# Patient Record
Sex: Female | Born: 1995 | Race: Black or African American | Hispanic: No | Marital: Single | Smoking: Former smoker
Health system: Southern US, Community
[De-identification: ages and names within clinical notes are randomized; demographics above are authoritative.]

## PROBLEM LIST (undated history)

## (undated) ENCOUNTER — Inpatient Hospital Stay (HOSPITAL_COMMUNITY): Payer: Self-pay

## (undated) DIAGNOSIS — E669 Obesity, unspecified: Secondary | ICD-10-CM

## (undated) DIAGNOSIS — L309 Dermatitis, unspecified: Secondary | ICD-10-CM

## (undated) DIAGNOSIS — J40 Bronchitis, not specified as acute or chronic: Secondary | ICD-10-CM

## (undated) DIAGNOSIS — I10 Essential (primary) hypertension: Secondary | ICD-10-CM

## (undated) DIAGNOSIS — J45909 Unspecified asthma, uncomplicated: Secondary | ICD-10-CM

## (undated) DIAGNOSIS — O24419 Gestational diabetes mellitus in pregnancy, unspecified control: Secondary | ICD-10-CM

## (undated) DIAGNOSIS — J302 Other seasonal allergic rhinitis: Secondary | ICD-10-CM

## (undated) HISTORY — DX: Gestational diabetes mellitus in pregnancy, unspecified control: O24.419

## (undated) HISTORY — PX: WISDOM TOOTH EXTRACTION: SHX21

## (undated) HISTORY — DX: Bronchitis, not specified as acute or chronic: J40

## (undated) HISTORY — DX: Dermatitis, unspecified: L30.9

## (undated) HISTORY — DX: Other seasonal allergic rhinitis: J30.2

---
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
## 2017-01-13 ENCOUNTER — Emergency Department (HOSPITAL_COMMUNITY)
Admission: EM | Admit: 2017-01-13 | Discharge: 2017-01-14 | Disposition: A | Discharge status: Home / Self Care | Payer: Medicaid Other | Attending: Emergency Medicine | Admitting: Emergency Medicine

## 2017-01-13 ENCOUNTER — Encounter: Payer: Self-pay | Admitting: Emergency Medicine

## 2017-01-13 DIAGNOSIS — S299XXA Unspecified injury of thorax, initial encounter: Secondary | ICD-10-CM | POA: Diagnosis present

## 2017-01-13 DIAGNOSIS — S29012A Strain of muscle and tendon of back wall of thorax, initial encounter: Secondary | ICD-10-CM | POA: Diagnosis not present

## 2017-01-13 DIAGNOSIS — Y939 Activity, unspecified: Secondary | ICD-10-CM | POA: Diagnosis not present

## 2017-01-13 DIAGNOSIS — S46811A Strain of other muscles, fascia and tendons at shoulder and upper arm level, right arm, initial encounter: Secondary | ICD-10-CM

## 2017-01-13 DIAGNOSIS — S39012A Strain of muscle, fascia and tendon of lower back, initial encounter: Secondary | ICD-10-CM | POA: Diagnosis not present

## 2017-01-13 DIAGNOSIS — Y92410 Unspecified street and highway as the place of occurrence of the external cause: Secondary | ICD-10-CM | POA: Insufficient documentation

## 2017-01-13 DIAGNOSIS — Y999 Unspecified external cause status: Secondary | ICD-10-CM | POA: Diagnosis not present

## 2017-01-13 MED ORDER — METHOCARBAMOL 500 MG PO TABS: 1000.0000 mg | ORAL_TABLET | Freq: Three times a day (TID) | ORAL | 0 refills | Status: DC | PRN

## 2017-01-13 MED ORDER — IBUPROFEN 200 MG PO TABS
400.0000 mg | ORAL_TABLET | Freq: Once | ORAL | Status: AC
  Administered 2017-01-14: 400 mg via ORAL
  Filled 2017-01-13: qty 2

## 2017-01-13 MED ORDER — ACETAMINOPHEN 500 MG PO TABS
1000.0000 mg | ORAL_TABLET | Freq: Once | ORAL | Status: AC
  Administered 2017-01-14: 1000 mg via ORAL
  Filled 2017-01-13: qty 2

## 2017-01-13 MED ORDER — METHOCARBAMOL 750 MG PO TABS: 750.0000 mg | ORAL_TABLET | Freq: Three times a day (TID) | ORAL | 0 refills | Status: DC | PRN

## 2017-01-13 NOTE — Discharge Instructions (Signed)
It was our pleasure to provide your ER care today - we hope that you feel better.  Rest. Take motrin or aleve as need for pain.  Take robaxin as need for muscle pain/spasm - no driving when taking.  Follow up with primary care doctor in 1 week if symptoms fail to improve/resolve.  Return to ER if worse, new symptoms, trouble breathing, new or severe pain, other concern.
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
## 2018-03-14 ENCOUNTER — Encounter (HOSPITAL_COMMUNITY): Payer: Self-pay | Admitting: *Deleted

## 2018-03-14 ENCOUNTER — Emergency Department (HOSPITAL_COMMUNITY)
Admission: EM | Admit: 2018-03-14 | Discharge: 2018-03-14 | Disposition: A | Discharge status: Home / Self Care | Payer: Medicaid Other | Attending: Emergency Medicine | Admitting: Emergency Medicine

## 2018-03-14 ENCOUNTER — Other Ambulatory Visit: Payer: Self-pay

## 2018-03-14 DIAGNOSIS — N390 Urinary tract infection, site not specified: Secondary | ICD-10-CM

## 2018-03-14 DIAGNOSIS — R350 Frequency of micturition: Secondary | ICD-10-CM | POA: Insufficient documentation

## 2018-03-14 DIAGNOSIS — R110 Nausea: Secondary | ICD-10-CM | POA: Insufficient documentation

## 2018-03-14 DIAGNOSIS — R319 Hematuria, unspecified: Secondary | ICD-10-CM | POA: Diagnosis not present

## 2018-03-14 DIAGNOSIS — R1030 Lower abdominal pain, unspecified: Secondary | ICD-10-CM | POA: Insufficient documentation

## 2018-03-14 DIAGNOSIS — N939 Abnormal uterine and vaginal bleeding, unspecified: Secondary | ICD-10-CM | POA: Insufficient documentation

## 2018-03-14 DIAGNOSIS — R1033 Periumbilical pain: Secondary | ICD-10-CM | POA: Diagnosis present

## 2018-03-14 LAB — COMPREHENSIVE METABOLIC PANEL
ALK PHOS: 32 U/L — AB (ref 38–126)
ALT: 23 U/L (ref 0–44)
AST: 22 U/L (ref 15–41)
Albumin: 3.7 g/dL (ref 3.5–5.0)
Anion gap: 8 (ref 5–15)
BUN: 10 mg/dL (ref 6–20)
CALCIUM: 9.5 mg/dL (ref 8.9–10.3)
CO2: 23 mmol/L (ref 22–32)
CREATININE: 0.74 mg/dL (ref 0.44–1.00)
Chloride: 109 mmol/L (ref 98–111)
Glucose, Bld: 85 mg/dL (ref 70–99)
Potassium: 4 mmol/L (ref 3.5–5.1)
SODIUM: 140 mmol/L (ref 135–145)
TOTAL PROTEIN: 7.6 g/dL (ref 6.5–8.1)
Total Bilirubin: 0.6 mg/dL (ref 0.3–1.2)

## 2018-03-14 LAB — WET PREP, GENITAL
SPERM: NONE SEEN
TRICH WET PREP: NONE SEEN
YEAST WET PREP: NONE SEEN

## 2018-03-14 LAB — URINALYSIS, ROUTINE W REFLEX MICROSCOPIC
Bilirubin Urine: NEGATIVE
GLUCOSE, UA: NEGATIVE mg/dL
KETONES UR: NEGATIVE mg/dL
NITRITE: NEGATIVE
PH: 6 (ref 5.0–8.0)
Protein, ur: 30 mg/dL — AB
Specific Gravity, Urine: 1.029 (ref 1.005–1.030)

## 2018-03-14 LAB — CBC
HCT: 40.1 % (ref 36.0–46.0)
HEMOGLOBIN: 13.7 g/dL (ref 12.0–15.0)
MCH: 30.3 pg (ref 26.0–34.0)
MCHC: 34.2 g/dL (ref 30.0–36.0)
MCV: 88.7 fL (ref 78.0–100.0)
PLATELETS: 328 10*3/uL (ref 150–400)
RBC: 4.52 MIL/uL (ref 3.87–5.11)
RDW: 12.4 % (ref 11.5–15.5)
WBC: 11.5 10*3/uL — AB (ref 4.0–10.5)

## 2018-03-14 LAB — LIPASE, BLOOD: Lipase: 21 U/L (ref 11–51)

## 2018-03-14 LAB — RAPID HIV SCREEN (HIV 1/2 AB+AG)
HIV 1/2 Antibodies: NONREACTIVE
HIV-1 P24 ANTIGEN - HIV24: NONREACTIVE

## 2018-03-14 LAB — I-STAT BETA HCG BLOOD, ED (MC, WL, AP ONLY): I-stat hCG, quantitative: <5 m[IU]/mL (ref <5)

## 2018-03-14 MED ORDER — CEPHALEXIN 500 MG PO CAPS: 500.0000 mg | ORAL_CAPSULE | Freq: Two times a day (BID) | ORAL | 0 refills | Status: AC

## 2018-03-14 MED ORDER — KETOROLAC TROMETHAMINE 15 MG/ML IJ SOLN
15.0000 mg | Freq: Once | INTRAMUSCULAR | Status: AC
  Administered 2018-03-14: 15 mg via INTRAVENOUS
  Filled 2018-03-14: qty 1

## 2018-03-14 MED ORDER — ONDANSETRON 4 MG PO TBDP
4.0000 mg | ORAL_TABLET | Freq: Once | ORAL | Status: AC
Start: 2018-03-14 — End: 2018-03-14
  Administered 2018-03-14: 4 mg via ORAL
  Filled 2018-03-14: qty 1
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
## 2018-03-14 NOTE — Discharge Instructions (Addendum)
You were given a prescription for antibiotics. Please take the antibiotic prescription fully.   If you continue to have blood in your urine you should follow-up with the urologist.  Their information was provided for you on your discharge paperwork.  Please follow-up with your regular doctor or your gynecologist in 1 week for reevaluation.  Please return to the ER sooner if you have any new or worsening symptoms, or if you have any of the following symptoms:  Abdominal pain that does not go away.  You have a fever.  You keep throwing up (vomiting).  The pain is felt only in portions of the abdomen. Pain in the right side could possibly be appendicitis. In an adult, pain in the left lower portion of the abdomen could be colitis or diverticulitis.  You pass bloody or black tarry stools.  There is bright red blood in the stool.  The constipation stays for more than 4 days.  There is belly (abdominal) or rectal pain.  You do not seem to be getting better.  You have any questions or concerns.

## 2018-03-14 NOTE — ED Provider Notes (Signed)
11:30 PM Patient care assumed from Peter Kiewit SonsCortni Couture, PA-C at shift change. Pending wet prep with plans to tx for UTI given pyuria and reported frequency in the setting of intermittent suprapubic abdominal pain. Of note, pain began at onset of menses. Wet prep shows clue cells, but patient denies abnormal discharge with her menses or prior to. Do not believe patient warrants acute treatment with Flagyl. No trichomonas or yeast present.  Discussed plan for discharge on Keflex.  Patient verbalizes understanding.  Have encouraged the use of Tylenol, ibuprofen, or Aleve for pain.  Return precautions discussed and provided. Patient discharged in stable condition with no unaddressed concerns.  Vitals:   03/14/18 2145 03/14/18 2215 03/14/18 2245 03/14/18 2330  BP: 129/82 137/63 107/81   Pulse: 78 81 75 73  Resp:      Temp:      SpO2: 100% 100% 99% 99%  Weight:      Height:          Antony MaduraHumes, Marks Scalera, PA-C 03/14/18 2332    Mancel BaleWentz, Elliott, MD 03/14/18 [phone]

## 2018-03-14 NOTE — ED Triage Notes (Signed)
lmp last month 20th another period last pm  She has abd pain for one week   Last normaol period was July 20th thru the 25th  She hjad a pos preg test

## 2018-03-15 LAB — GC/CHLAMYDIA PROBE AMP (~~LOC~~) NOT AT ARMC
CHLAMYDIA, DNA PROBE: POSITIVE — AB
Neisseria Gonorrhea: NEGATIVE

## 2018-03-16 LAB — RPR: RPR: NONREACTIVE

## 2018-03-19 ENCOUNTER — Telehealth: Payer: Self-pay | Admitting: Medical

## 2018-03-19 DIAGNOSIS — A749 Chlamydial infection, unspecified: Secondary | ICD-10-CM

## 2018-03-19 MED ORDER — AZITHROMYCIN 250 MG PO TABS: 1000.0000 mg | ORAL_TABLET | Freq: Once | ORAL | 0 refills | Status: AC

## 2018-03-19 NOTE — Telephone Encounter (Addendum)
Autumn Duarte tested positive for  Chlamydia. Patient was called by RN and allergies and pharmacy confirmed. Rx sent to pharmacy of choice.   Marny LowensteinWenzel, Cailin Gebel N, PA-C 03/19/2018 10:25 AM      ----- Message from Kathe BectonLori S Berdik, RN sent at 03/16/2018  1:27 PM EDT ----- This patient tested positive for:  Chlamydia  She "has NKDA",  I have informed the patient of her results and confirmed her pharmacy is correct in her chart. Please send Rx.   Thank you,   Kathe BectonBerdik, Lori S, RN   [facility]

## 2018-04-04 ENCOUNTER — Ambulatory Visit (HOSPITAL_COMMUNITY): Payer: Medicaid Other | Admitting: Psychiatry

## 2018-04-10 ENCOUNTER — Ambulatory Visit (HOSPITAL_COMMUNITY): Payer: Self-pay | Admitting: Psychiatry

## 2018-04-19 ENCOUNTER — Ambulatory Visit (HOSPITAL_COMMUNITY): Payer: Self-pay | Admitting: Psychiatry

## 2018-04-26 ENCOUNTER — Ambulatory Visit (HOSPITAL_COMMUNITY): Payer: Self-pay | Admitting: Licensed Clinical Social Worker

## 2018-05-10 ENCOUNTER — Encounter (HOSPITAL_BASED_OUTPATIENT_CLINIC_OR_DEPARTMENT_OTHER): Payer: Self-pay | Admitting: *Deleted

## 2018-05-10 ENCOUNTER — Emergency Department (HOSPITAL_BASED_OUTPATIENT_CLINIC_OR_DEPARTMENT_OTHER)
Admission: EM | Admit: 2018-05-10 | Discharge: 2018-05-10 | Disposition: A | Discharge status: Home / Self Care | Payer: Medicaid Other | Attending: Emergency Medicine | Admitting: Emergency Medicine

## 2018-05-10 ENCOUNTER — Other Ambulatory Visit: Payer: Self-pay

## 2018-05-10 DIAGNOSIS — O219 Vomiting of pregnancy, unspecified: Secondary | ICD-10-CM

## 2018-05-10 DIAGNOSIS — Z3A01 Less than 8 weeks gestation of pregnancy: Secondary | ICD-10-CM | POA: Insufficient documentation

## 2018-05-10 DIAGNOSIS — Z79899 Other long term (current) drug therapy: Secondary | ICD-10-CM | POA: Diagnosis not present

## 2018-05-10 LAB — URINALYSIS, ROUTINE W REFLEX MICROSCOPIC
Bilirubin Urine: NEGATIVE
GLUCOSE, UA: NEGATIVE mg/dL
Ketones, ur: 15 mg/dL — AB
Nitrite: NEGATIVE
PROTEIN: NEGATIVE mg/dL
SPECIFIC GRAVITY, URINE: 1.02 (ref 1.005–1.030)
pH: 7.5 (ref 5.0–8.0)

## 2018-05-10 LAB — URINALYSIS, MICROSCOPIC (REFLEX)

## 2018-05-10 MED ORDER — METOCLOPRAMIDE HCL 10 MG PO TABS: 10.0000 mg | ORAL_TABLET | Freq: Four times a day (QID) | ORAL | 0 refills | Status: DC | PRN

## 2018-05-10 MED ORDER — SODIUM CHLORIDE 0.9 % IV BOLUS
1000.0000 mL | Freq: Once | INTRAVENOUS | Status: AC
  Administered 2018-05-10: 1000 mL via INTRAVENOUS

## 2018-05-10 MED ORDER — METOCLOPRAMIDE HCL 5 MG/ML IJ SOLN
10.0000 mg | Freq: Once | INTRAMUSCULAR | Status: AC
  Administered 2018-05-10: 10 mg via INTRAVENOUS
  Filled 2018-05-10: qty 2

## 2018-05-10 NOTE — ED Provider Notes (Signed)
[facility] EMERGENCY DEPARTMENT Provider Note   CSN: [id number] Arrival date & time: 05/10/18  1813     History   Chief Complaint Chief Complaint  Patient presents with  . Nausea    HPI Autumn Duarte is a 22 y.o. female.  Patient who is approximately [redacted] weeks pregnant presents to the emergency department today with 1 week history of nausea, vomiting.  Patient states that she has been having approximately 2-3 episodes of nausea vomiting per day however today symptoms became worse and she is having approximately 6-7 episodes of vomiting.  She denies any chest pain or shortness of breath.  No abdominal pain, vaginal bleeding, pelvic pain, discharge. The onset of this condition was acute. The course is constant. Aggravating factors: none. Alleviating factors: none.  This is the patient's first pregnancy.     Past Medical History:  Diagnosis Date  . Arthritis     Patient Active Problem List   Diagnosis Date Noted  . HAIR LOSS 11/18/2008  . PROBLEMS WITH HEARING 11/18/2008  . DYSPEPSIA&OTHER [facility] DISORDERS FUNCTION STOMACH 10/10/2007  . PES PLANUS 10/10/2007  . GROWING PAINS 10/10/2007  . SINUSITIS, ACUTE 08/24/2007  . ECZEMA 04/16/2007    History reviewed. No pertinent surgical history.   OB History   None      Home Medications    Prior to Admission medications   Medication Sig Start Date End Date Taking? Authorizing Provider  Prenatal Vit-Fe Fumarate-FA (PRENATAL MULTIVITAMIN) TABS tablet Take 1 tablet by mouth daily at 12 noon.   Yes [provider]    Family History History reviewed. No pertinent family history.  Social History Social History   Tobacco Use  . Smoking status: Never Smoker  . Smokeless tobacco: Never Used  Substance Use Topics  . Alcohol use: Not Currently  . Drug use: Not Currently     Allergies   Patient has no known allergies.   Review of Systems Review of Systems  Constitutional: Negative for fever.    HENT: Negative for rhinorrhea and sore throat.   Eyes: Negative for redness.  Respiratory: Negative for cough.   Cardiovascular: Negative for chest pain.  Gastrointestinal: Positive for nausea and vomiting. Negative for abdominal pain and diarrhea.  Genitourinary: Negative for dysuria.  Musculoskeletal: Negative for myalgias.  Skin: Negative for rash.  Neurological: Negative for headaches.     Physical Exam Updated Vital Signs BP 115/62 (BP Location: Left Arm)   Temp 98.2 F (36.8 C)   Ht 5\' 3"  (1.6 m)   Wt 127 kg   LMP 03/19/2018   SpO2 100%   BMI 49.60 kg/m   Physical Exam  Constitutional: She appears well-developed and well-nourished.  HENT:  Head: Normocephalic and atraumatic.  Eyes: Conjunctivae are normal. Right eye exhibits no discharge. Left eye exhibits no discharge.  Neck: Normal range of motion. Neck supple.  Cardiovascular: Normal rate, regular rhythm and normal heart sounds.  Pulmonary/Chest: Effort normal and breath sounds normal.  Abdominal: Soft. There is no tenderness. There is no rebound and no guarding.  Neurological: She is alert.  Skin: Skin is warm and dry.  Psychiatric: She has a normal mood and affect.  Nursing note and vitals reviewed.    ED Treatments / Results  Labs (all labs ordered are listed, but only abnormal results are displayed) Labs Reviewed  URINALYSIS, ROUTINE W REFLEX MICROSCOPIC - Abnormal; Notable for the following components:      Result Value   APPearance HAZY (*)  Hgb urine dipstick TRACE (*)    Ketones, ur 15 (*)    Leukocytes, UA SMALL (*)    All other components within normal limits  URINALYSIS, MICROSCOPIC (REFLEX) - Abnormal; Notable for the following components:   Bacteria, UA RARE (*)    All other components within normal limits    EKG None  Radiology No results found.  Procedures Procedures (including critical care time)  Medications Ordered in ED Medications  sodium chloride 0.9 % bolus 1,000 mL  (1,000 mLs Intravenous New Bag/Given 05/10/18 2012)  metoCLOPramide (REGLAN) injection 10 mg (10 mg Intravenous Given 05/10/18 2013)     Initial Impression / Assessment and Plan / ED Course  I have reviewed the triage vital signs and the nursing notes.  Pertinent labs & imaging results that were available during my care of the patient were reviewed by me and considered in my medical decision making (see chart for details).     Patient seen and examined. Work-up initiated. Medications ordered.  Will p.o. challenge after administration of nausea medication.  Vital signs reviewed and are as follows: BP 115/62 (BP Location: Left Arm)   Temp 98.2 F (36.8 C)   Ht 5\' 3"  (1.6 m)   Wt 127 kg   LMP 03/19/2018   SpO2 100%   BMI 49.60 kg/m   Patient feels better after IV Reglan and fluids.  She is dressed and sitting in the room.  She states she is ready for discharge.  Home with Reglan.  The patient was urged to return to the Emergency Department immediately with worsening of current symptoms, vaginal bleeding or lower abdominal pain, worsening abdominal pain, persistent vomiting, blood noted in stools, fever, or any other concerns. The patient verbalized understanding.    Final Clinical Impressions(s) / ED Diagnoses   Final diagnoses:  Nausea and vomiting in pregnancy   Patient with nausea and vomiting in pregnancy.  No vaginal symptoms or abdominal pain reported or on exam.  UA without definite infection.  Patient treated with IV fluids and antiemetics with great improvement.  She has appropriate OB/GYN follow-up.  Return instructions as above.  Patient seems reliable to return if symptoms worsen.  ED Discharge Orders         Ordered    metoCLOPramide (REGLAN) 10 MG tablet  Every 6 hours PRN     05/10/18 2146           Renne Crigler, PA-C 05/10/18 2149    Alvira Monday, MD 05/11/18 1245

## 2018-05-10 NOTE — ED Triage Notes (Signed)
Pt c/o n/v  , 7 week preg .

## 2018-05-10 NOTE — ED Notes (Signed)
Pt has been able to drink and eat without issue

## 2018-05-10 NOTE — ED Notes (Signed)
Patient left at this time with all belongings. 

## 2018-05-10 NOTE — ED Notes (Signed)
ED Provider at bedside. 

## 2018-05-10 NOTE — Discharge Instructions (Signed)
Please read and follow all provided instructions.  Your diagnoses today include:  1. Nausea and vomiting in pregnancy     Tests performed today include:  Urine test to look for infection   Vital signs. See below for your results today.   Medications prescribed:   Reglan-medication for nausea and vomiting  Take any prescribed medications only as directed.  Home care instructions:   Follow any educational materials contained in this packet.   Drink clear liquids for the next 24 hours and introduce solid foods slowly after 24 hours using the b.r.a.t. diet (Bananas, Rice, Applesauce, Toast, Yogurt).    Follow-up instructions: Please follow-up with your primary care provider as needed for further evaluation of your symptoms.   Return instructions:  SEEK IMMEDIATE MEDICAL ATTENTION IF:  If you have pain that does not go away or becomes severe   A temperature above 101F develops   You have vaginal bleeding or lower abdominal pain  Repeated vomiting occurs (multiple episodes)   If you have pain that becomes localized to portions of the abdomen. The right side could possibly be appendicitis. In an adult, the left lower portion of the abdomen could be colitis or diverticulitis.   Blood is being passed in stools or vomit (bright red or black tarry stools)   You develop chest pain, difficulty breathing, dizziness or fainting, or become confused, poorly responsive, or inconsolable (young children)  If you have any other emergent concerns regarding your health  Your vital signs today were: BP 122/67 (BP Location: Right Arm)    Pulse 73    Temp 98.2 F (36.8 C)    Resp 16    Ht 5\' 3"  (1.6 m)    Wt 127 kg    LMP 03/19/2018    SpO2 100%    BMI 49.60 kg/m  If your blood pressure (bp) was elevated above 135/85 this visit, please have this repeated by your doctor within one month. --------------

## 2018-06-02 ENCOUNTER — Inpatient Hospital Stay (HOSPITAL_COMMUNITY)
Admission: AD | Admit: 2018-06-02 | Discharge: 2018-06-02 | Disposition: A | Discharge status: Home / Self Care | Payer: Medicaid Other | Attending: Obstetrics & Gynecology | Admitting: Obstetrics & Gynecology

## 2018-06-02 ENCOUNTER — Inpatient Hospital Stay (HOSPITAL_COMMUNITY): Payer: Medicaid Other

## 2018-06-02 ENCOUNTER — Encounter (HOSPITAL_COMMUNITY): Payer: Self-pay | Admitting: *Deleted

## 2018-06-02 DIAGNOSIS — Z87891 Personal history of nicotine dependence: Secondary | ICD-10-CM | POA: Diagnosis not present

## 2018-06-02 DIAGNOSIS — R109 Unspecified abdominal pain: Secondary | ICD-10-CM

## 2018-06-02 DIAGNOSIS — O30001 Twin pregnancy, unspecified number of placenta and unspecified number of amniotic sacs, first trimester: Secondary | ICD-10-CM | POA: Insufficient documentation

## 2018-06-02 DIAGNOSIS — Z3A11 11 weeks gestation of pregnancy: Secondary | ICD-10-CM | POA: Diagnosis not present

## 2018-06-02 DIAGNOSIS — R1030 Lower abdominal pain, unspecified: Secondary | ICD-10-CM | POA: Diagnosis not present

## 2018-06-02 DIAGNOSIS — O26891 Other specified pregnancy related conditions, first trimester: Secondary | ICD-10-CM | POA: Diagnosis not present

## 2018-06-02 HISTORY — DX: Obesity, unspecified: E66.9

## 2018-06-02 HISTORY — DX: Unspecified asthma, uncomplicated: J45.909

## 2018-06-02 LAB — URINALYSIS, ROUTINE W REFLEX MICROSCOPIC
Bilirubin Urine: NEGATIVE
Glucose, UA: NEGATIVE mg/dL
Ketones, ur: NEGATIVE mg/dL
Nitrite: NEGATIVE
PROTEIN: NEGATIVE mg/dL
Specific Gravity, Urine: 1.018 (ref 1.005–1.030)
pH: 7 (ref 5.0–8.0)

## 2018-06-02 LAB — WET PREP, GENITAL
TRICH WET PREP: NONE SEEN
YEAST WET PREP: NONE SEEN

## 2018-06-02 LAB — CBC
HEMATOCRIT: 35.8 % — AB (ref 36.0–46.0)
Hemoglobin: 12.7 g/dL (ref 12.0–15.0)
MCH: 30.8 pg (ref 26.0–34.0)
MCHC: 35.5 g/dL (ref 30.0–36.0)
MCV: 86.9 fL (ref 80.0–100.0)
Platelets: 247 10*3/uL (ref 150–400)
RBC: 4.12 MIL/uL (ref 3.87–5.11)
RDW: 13 % (ref 11.5–15.5)
WBC: 8.5 10*3/uL (ref 4.0–10.5)

## 2018-06-02 LAB — HCG, QUANTITATIVE, PREGNANCY: HCG, BETA CHAIN, QUANT, S: 172555 m[IU]/mL — AB (ref <5)

## 2018-06-02 LAB — ABO/RH: ABO/RH(D): O POS

## 2018-06-02 LAB — HIV ANTIBODY (ROUTINE TESTING W REFLEX): HIV Screen 4th Generation wRfx: NONREACTIVE

## 2018-06-02 LAB — POCT PREGNANCY, URINE: Preg Test, Ur: POSITIVE — AB

## 2018-06-02 NOTE — MAU Note (Signed)
Have had a cramp in LLQ constantly  since this am. Denies vag bleeding or d/c.

## 2018-06-02 NOTE — Progress Notes (Signed)
RN obtained swabs by blind swab 

## 2018-06-02 NOTE — Progress Notes (Signed)
Lisa Leftwich-Kirby CNM in earlier to discuss test results and d/c plan. Written and verbal d/c instructions given and understanding voiced. 

## 2018-06-02 NOTE — MAU Provider Note (Signed)
Chief Complaint: Abdominal Pain   None     SUBJECTIVE HPI: Autumn Duarte is a 22 y.o. G1P0 who presents to maternity admissions reporting intermittent sharp lower abdominal pain x 24 hours.  The pain is the same in intensity since onset. There are no other symptoms.  She has not tried any treatments. She denies vaginal bleeding, vaginal itching/burning, urinary symptoms, h/a, dizziness, n/v, or fever/chills.     HPI  Past Medical History:  Diagnosis Date  . Arthritis   . Asthma   . Obesity    Past Surgical History:  Procedure Laterality Date  . NO PAST SURGERIES     Social History   Socioeconomic History  . Marital status: Single    Spouse name: Not on file  . Number of children: Not on file  . Years of education: Not on file  . Highest education level: Not on file  Occupational History  . Not on file  Social Needs  . Financial resource strain: Not on file  . Food insecurity:    Worry: Not on file    Inability: Not on file  . Transportation needs:    Medical: Not on file    Non-medical: Not on file  Tobacco Use  . Smoking status: Former Games developer  . Smokeless tobacco: Never Used  Substance and Sexual Activity  . Alcohol use: Not Currently  . Drug use: Not Currently  . Sexual activity: Yes    Birth control/protection: None  Lifestyle  . Physical activity:    Days per week: Not on file    Minutes per session: Not on file  . Stress: Not on file  Relationships  . Social connections:    Talks on phone: Not on file    Gets together: Not on file    Attends religious service: Not on file    Active member of club or organization: Not on file    Attends meetings of clubs or organizations: Not on file    Relationship status: Not on file  . Intimate partner violence:    Fear of current or ex partner: Not on file    Emotionally abused: Not on file    Physically abused: Not on file    Forced sexual activity: Not on file  Other Topics Concern  . Not on file  Social  History Narrative  . Not on file   No current facility-administered medications on file prior to encounter.    Current Outpatient Medications on File Prior to Encounter  Medication Sig Dispense Refill  . doxylamine, Sleep, (UNISOM) 25 MG tablet Take 25 mg by mouth at bedtime.    . Prenatal Vit-Fe Fumarate-FA (PRENATAL MULTIVITAMIN) TABS tablet Take 1 tablet by mouth daily at 12 noon.    . metoCLOPramide (REGLAN) 10 MG tablet Take 1 tablet (10 mg total) by mouth every 6 (six) hours as needed for nausea or vomiting. 15 tablet 0   No Known Allergies  ROS:  Review of Systems  Constitutional: Negative for chills, fatigue and fever.  Respiratory: Negative for shortness of breath.   Cardiovascular: Negative for chest pain.  Gastrointestinal: Positive for abdominal pain. Negative for nausea and vomiting.  Genitourinary: Positive for pelvic pain. Negative for difficulty urinating, dysuria, flank pain, vaginal bleeding, vaginal discharge and vaginal pain.  Neurological: Negative for dizziness and headaches.  Psychiatric/Behavioral: Negative.      I have reviewed patient's Past Medical Hx, Surgical Hx, Family Hx, Social Hx, medications and allergies.   Physical Exam  Patient Vitals for the past 24 hrs:  BP Temp Temp src Pulse Resp Height Weight  06/02/18 0253 136/83 98.9 F (37.2 C) - 98 18 - -  06/02/18 0238 (!) 152/96 98.9 F (37.2 C) Oral 100 18 - -  06/02/18 0031 - 98.5 F (36.9 C) - 87 20 5\' 3"  (1.6 m) 131.1 kg   Constitutional: Well-developed, well-nourished female in no acute distress.  Cardiovascular: normal rate Respiratory: normal effort GI: Abd soft, non-tender. Pos BS x 4 MS: Extremities nontender, no edema, normal ROM Neurologic: Alert and oriented x 4.  GU: Neg CVAT.  PELVIC EXAM: Wet prep/GCC    LAB RESULTS Results for orders placed or performed during the hospital encounter of 06/02/18 (from the past 24 hour(s))  Urinalysis, Routine w reflex microscopic      Status: Abnormal   Collection Time: 06/02/18 12:42 AM  Result Value Ref Range   Color, Urine YELLOW YELLOW   APPearance HAZY (A) CLEAR   Specific Gravity, Urine 1.018 1.005 - 1.030   pH 7.0 5.0 - 8.0   Glucose, UA NEGATIVE NEGATIVE mg/dL   Hgb urine dipstick SMALL (A) NEGATIVE   Bilirubin Urine NEGATIVE NEGATIVE   Ketones, ur NEGATIVE NEGATIVE mg/dL   Protein, ur NEGATIVE NEGATIVE mg/dL   Nitrite NEGATIVE NEGATIVE   Leukocytes, UA LARGE (A) NEGATIVE   RBC / HPF 0-5 0 - 5 RBC/hpf   WBC, UA 11-20 0 - 5 WBC/hpf   Bacteria, UA RARE (A) NONE SEEN   Squamous Epithelial / LPF 6-10 0 - 5   Sperm, UA PRESENT   Pregnancy, urine POC     Status: Abnormal   Collection Time: 06/02/18 12:53 AM  Result Value Ref Range   Preg Test, Ur POSITIVE (A) NEGATIVE  CBC     Status: Abnormal   Collection Time: 06/02/18  1:13 AM  Result Value Ref Range   WBC 8.5 4.0 - 10.5 K/uL   RBC 4.12 3.87 - 5.11 MIL/uL   Hemoglobin 12.7 12.0 - 15.0 g/dL   HCT 16.1 (L) 09.6 - 04.5 %   MCV 86.9 80.0 - 100.0 fL   MCH 30.8 26.0 - 34.0 pg   MCHC 35.5 30.0 - 36.0 g/dL   RDW 40.9 81.1 - 91.4 %   Platelets 247 150 - 400 K/uL  hCG, quantitative, pregnancy     Status: Abnormal   Collection Time: 06/02/18  1:13 AM  Result Value Ref Range   hCG, Beta Chain, Quant, S 172,555 (H) <5 mIU/mL  ABO/Rh     Status: None (Preliminary result)   Collection Time: 06/02/18  1:13 AM  Result Value Ref Range   ABO/RH(D)      O POS Performed at [facility], [address] [place], [place] [address]   Wet prep, genital     Status: Abnormal   Collection Time: 06/02/18  1:48 AM  Result Value Ref Range   Yeast Wet Prep HPF POC NONE SEEN NONE SEEN   Trich, Wet Prep NONE SEEN NONE SEEN   Clue Cells Wet Prep HPF POC PRESENT (A) NONE SEEN   WBC, Wet Prep HPF POC FEW (A) NONE SEEN   Sperm PRESENT     --/--/O POS Performed at [facility], [address], [place], [place] [address]  [phone])  IMAGING US Ob Comp Less 14  Wks  Result Date: 06/02/2018 CLINICAL DATA:  LEFT lower quadrant pain. Positive pregnancy test. Gestational age by last menstrual period 10 weeks and 5 days. Beta  HCG 172,555. EXAM: TWIN OBSTETRICAL ULTRASOUND <14 WKS COMPARISON:  None. FINDINGS: Number of IUPs:  2 Chorionicity/Amnionicity:  Dichorionic/diamniotic TWIN 1 Yolk sac:  Not present Embryo:  Present Cardiac Activity: Present Heart Rate: 161 bpm CRL: 43 mm   11 w 1 d                  [place] EDC: Dec 21, 2018 With TWIN 2 Yolk sac:  Not present Embryo:  Present Cardiac Activity: Present Heart Rate: 180 bpm CRL:   44 mm   11 w 1 d                  [place] EDC: Dec 21, 2018 Subchorionic hemorrhage:  None visualized. Maternal uterus/adnexae: Trace free fluid. 3.3 cm RIGHT anechoic simple cyst. 2 cm LEFT corpus luteal cyst. IMPRESSION: Twin live intrauterine pregnancies; gestational age by ultrasound 11 weeks and 1 day without an immediate complication. Electronically Signed   By: Awilda Metro M.D.   On: 06/02/2018 02:43   US Ob Comp Addl Gest Less 14 Wks  Result Date: 06/02/2018 CLINICAL DATA:  LEFT lower quadrant pain. Positive pregnancy test. Gestational age by last menstrual period 10 weeks and 5 days. Beta HCG 172,555. EXAM: TWIN OBSTETRICAL ULTRASOUND <14 WKS COMPARISON:  None. FINDINGS: Number of IUPs:  2 Chorionicity/Amnionicity:  Dichorionic/diamniotic TWIN 1 Yolk sac:  Not present Embryo:  Present Cardiac Activity: Present Heart Rate: 161 bpm CRL: 43 mm   11 w 1 d                  [place] EDC: Dec 21, 2018 With TWIN 2 Yolk sac:  Not present Embryo:  Present Cardiac Activity: Present Heart Rate: 180 bpm CRL:   44 mm   11 w 1 d                  [place] EDC: Dec 21, 2018 Subchorionic hemorrhage:  None visualized. Maternal uterus/adnexae: Trace free fluid. 3.3 cm RIGHT anechoic simple cyst. 2 cm LEFT corpus luteal cyst. IMPRESSION: Twin live intrauterine pregnancies; gestational age by ultrasound 11 weeks and 1 day without an immediate complication. Electronically  Signed   By: Awilda Metro M.D.   On: 06/02/2018 02:43    MAU Management/MDM: Ordered labs and reviewed results.  UA with leukocytes no nitrites, sent for culture US shows IUP twins pregnancy at 11 weeks.  Discussed results with pt and s/o today.   Pt has appt at [facility] on Thursday, so keep this appointment.  Pt may need to be transferred to [facility] for high risk as needed.  Wet prep with clue cells, no positive odor or other symptoms.  Pt discharged with strict return precautions.  ASSESSMENT 1. Twin gestation in first trimester, unspecified multiple gestation type   2. Abdominal pain during pregnancy in first trimester     PLAN Discharge home Allergies as of 06/02/2018   No Known Allergies     Medication List    TAKE these medications   doxylamine (Sleep) 25 MG tablet Commonly known as:  UNISOM Take 25 mg by mouth at bedtime.   metoCLOPramide 10 MG tablet Commonly known as:  REGLAN Take 1 tablet (10 mg total) by mouth every 6 (six) hours as needed for nausea or vomiting.   prenatal multivitamin Tabs tablet Take 1 tablet by mouth daily at 12 noon.      Follow-up Information    Department, [facility] Follow up.   Why:  As scheduled  on 06/07/18. Return to MAU as needed for emergencies. Contact information: [address] [place] [address] [phone]           Sharen Counter Certified Nurse-Midwife 06/02/2018  3:49 AM

## 2018-06-03 ENCOUNTER — Inpatient Hospital Stay (HOSPITAL_COMMUNITY)
Admission: AD | Admit: 2018-06-03 | Discharge: 2018-06-03 | Disposition: A | Discharge status: Home / Self Care | Payer: Medicaid Other | Source: Ambulatory Visit | Attending: Obstetrics and Gynecology | Admitting: Obstetrics and Gynecology

## 2018-06-03 ENCOUNTER — Encounter (HOSPITAL_COMMUNITY): Payer: Self-pay | Admitting: *Deleted

## 2018-06-03 ENCOUNTER — Other Ambulatory Visit: Payer: Self-pay

## 2018-06-03 DIAGNOSIS — Z87891 Personal history of nicotine dependence: Secondary | ICD-10-CM | POA: Insufficient documentation

## 2018-06-03 DIAGNOSIS — G44209 Tension-type headache, unspecified, not intractable: Secondary | ICD-10-CM | POA: Insufficient documentation

## 2018-06-03 DIAGNOSIS — G44201 Tension-type headache, unspecified, intractable: Secondary | ICD-10-CM | POA: Diagnosis not present

## 2018-06-03 DIAGNOSIS — O26891 Other specified pregnancy related conditions, first trimester: Secondary | ICD-10-CM | POA: Diagnosis not present

## 2018-06-03 DIAGNOSIS — Z3A11 11 weeks gestation of pregnancy: Secondary | ICD-10-CM

## 2018-06-03 DIAGNOSIS — O99351 Diseases of the nervous system complicating pregnancy, first trimester: Secondary | ICD-10-CM | POA: Diagnosis not present

## 2018-06-03 LAB — URINALYSIS, ROUTINE W REFLEX MICROSCOPIC
Bilirubin Urine: NEGATIVE
Glucose, UA: NEGATIVE mg/dL
Hgb urine dipstick: NEGATIVE
Ketones, ur: NEGATIVE mg/dL
Nitrite: NEGATIVE
Protein, ur: NEGATIVE mg/dL
Specific Gravity, Urine: 1.02 (ref 1.005–1.030)
pH: 7 (ref 5.0–8.0)

## 2018-06-03 LAB — CULTURE, OB URINE: Culture: <10000 — AB

## 2018-06-03 MED ORDER — BUTALBITAL-APAP-CAFFEINE 50-325-40 MG PO TABS
2.0000 | ORAL_TABLET | Freq: Once | ORAL | Status: AC
  Administered 2018-06-03: 2 via ORAL
  Filled 2018-06-03: qty 2

## 2018-06-03 MED ORDER — BUTALBITAL-APAP-CAFFEINE 50-325-40 MG PO TABS: 1.0000 | ORAL_TABLET | Freq: Four times a day (QID) | ORAL | 0 refills | Status: DC | PRN

## 2018-06-03 NOTE — Discharge Instructions (Signed)

## 2018-06-03 NOTE — MAU Note (Signed)
Pt reports dizziness and HA x3 days. States she has sensitivity to light. Has taken Tylenol-last took at 10a. States it did not work so she did not take anymore. Called the on call RN who told her she needed to be evaluated because her BP may be too high. Pt states she has had migraines in the past, but none this bad.

## 2018-06-03 NOTE — MAU Provider Note (Signed)
History     CSN: [id number]  Arrival date and time: 06/03/18 2131   First Provider Initiated Contact with Patient 06/03/18 2223      Chief Complaint  Patient presents with  . Headache   Autumn Duarte is a 22 y.o. G1P0 at [redacted]w[redacted]d who presents to MAU with complaints of HA. She reports HA has been occurring for past 3 days, has hx of migraines that she was not on medication for prior to pregnancy. She reports HA being sensitive to light and unable to sleep. Rates pain 7/10, took Tylenol 325mg  one time around 1000am with no relief. She denies abdominal pain, vaginal bleeding, vaginal discharge, urinary symptoms or N/V. Was diagnosed with twins when seen yesterday in MAU.    OB History    Gravida  1   Para      Term      Preterm      AB      Living        SAB      TAB      Ectopic      Multiple      Live Births              Past Medical History:  Diagnosis Date  . Arthritis   . Asthma   . Obesity     Past Surgical History:  Procedure Laterality Date  . NO PAST SURGERIES      No family history on file.  Social History   Tobacco Use  . Smoking status: Former Games developer  . Smokeless tobacco: Never Used  Substance Use Topics  . Alcohol use: Not Currently  . Drug use: Not Currently    Allergies: No Known Allergies  Medications Prior to Admission  Medication Sig Dispense Refill Last Dose  . doxylamine, Sleep, (UNISOM) 25 MG tablet Take 25 mg by mouth at bedtime.   06/02/2018 at Unknown time  . metoCLOPramide (REGLAN) 10 MG tablet Take 1 tablet (10 mg total) by mouth every 6 (six) hours as needed for nausea or vomiting. 15 tablet 0 Past Week at Unknown time  . Prenatal Vit-Fe Fumarate-FA (PRENATAL MULTIVITAMIN) TABS tablet Take 1 tablet by mouth daily at 12 noon.   06/02/2018 at Unknown time    Review of Systems  Constitutional: Negative.   Respiratory: Negative.   Cardiovascular: Negative.   Gastrointestinal: Negative.   Neurological: Positive for  headaches. Negative for dizziness, syncope and light-headedness.   Physical Exam   Blood pressure 128/78, pulse 79, temperature 98.1 F (36.7 C), temperature source Oral, resp. rate 18, height 5\' 3"  (1.6 m), weight 132 kg, last menstrual period 03/19/2018, SpO2 99 %.  Physical Exam  Nursing note and vitals reviewed. Constitutional: She is oriented to person, place, and time. She appears well-developed and well-nourished. No distress.  Cardiovascular: Normal rate, regular rhythm and normal heart sounds.  Respiratory: Effort normal and breath sounds normal. No respiratory distress. She has no wheezes.  GI: Soft. She exhibits no distension. There is no tenderness.  Neurological: She is alert and oriented to person, place, and time.  Psychiatric: She has a normal mood and affect. Her behavior is normal. Thought content normal.   Pt informed that the ultrasound is considered a limited OB ultrasound and is not intended to be a complete ultrasound exam.  Patient also informed that the ultrasound is not being completed with the intent of assessing for fetal or placental anomalies or any pelvic abnormalities.  Explained that the purpose of  today's ultrasound is to assess for  viability.  Patient acknowledges the purpose of the exam and the limitations of the study.    FHR 148 and FHR 162 by bedside US   MAU Course  Procedures  MDM Orders Placed This Encounter  Procedures  . Culture, OB Urine  . Urinalysis, Routine w reflex microscopic  . Encourage fluids   Results for orders placed or performed during the hospital encounter of 06/03/18 (from the past 24 hour(s))  Urinalysis, Routine w reflex microscopic     Status: Abnormal   Collection Time: 06/03/18  9:39 PM  Result Value Ref Range   Color, Urine YELLOW YELLOW   APPearance CLOUDY (A) CLEAR   Specific Gravity, Urine 1.020 1.005 - 1.030   pH 7.0 5.0 - 8.0   Glucose, UA NEGATIVE NEGATIVE mg/dL   Hgb urine dipstick NEGATIVE NEGATIVE    Bilirubin Urine NEGATIVE NEGATIVE   Ketones, ur NEGATIVE NEGATIVE mg/dL   Protein, ur NEGATIVE NEGATIVE mg/dL   Nitrite NEGATIVE NEGATIVE   Leukocytes, UA LARGE (A) NEGATIVE   RBC / HPF 0-5 0 - 5 RBC/hpf   WBC, UA 21-50 0 - 5 WBC/hpf   Bacteria, UA RARE (A) NONE SEEN   Squamous Epithelial / LPF 11-20 0 - 5   Mucus PRESENT    Meds ordered this encounter  Medications  . butalbital-acetaminophen-caffeine (FIORICET, ESGIC) 50-325-40 MG per tablet 2 tablet  . butalbital-acetaminophen-caffeine (FIORICET, ESGIC) 50-325-40 MG tablet    Sig: Take 1 tablet by mouth every 6 (six) hours as needed for headache.    Dispense:  20 tablet    Refill:  0    Order Specific Question:   Supervising Provider    Answer:   Alysia Penna, MICHAEL L [[id number]]   Treatments in MAU included Fioricet 2 tablets, patient reports HA completely relieved after medication, patient asleep upon entrance to room for reassessment after medication.   Educated and discussed reasons to return to MAU. Discussed safe medications during pregnancy and list given. Follow up as scheduled for initial prenatal visit. Pt stable at time of discharge.   Assessment and Plan   1. Acute intractable tension-type headache   2. [redacted] weeks gestation of pregnancy    Discharge home  Follow up as scheduled for initial prenatal visit  Return to MAU as needed Rx for Fioricet to be taken if Tylenol does not relieve HA  Safe medications in pregnancy list given   Follow-up Information    Department, [facility] Follow up.   Why:  Follow up as scheduled for prenatal appointment  Contact information: [address] [place] [address] [phone]          Allergies as of 06/03/2018   No Known Allergies     Medication List    TAKE these medications   butalbital-acetaminophen-caffeine 50-325-40 MG tablet Commonly known as:  FIORICET, ESGIC Take 1 tablet by mouth every 6 (six) hours as needed for headache.   doxylamine (Sleep)  25 MG tablet Commonly known as:  UNISOM Take 25 mg by mouth at bedtime.   metoCLOPramide 10 MG tablet Commonly known as:  REGLAN Take 1 tablet (10 mg total) by mouth every 6 (six) hours as needed for nausea or vomiting.   prenatal multivitamin Tabs tablet Take 1 tablet by mouth daily at 12 noon.      Sharyon Cable CNM 06/03/2018, 11:15 PM

## 2018-06-04 LAB — GC/CHLAMYDIA PROBE AMP (~~LOC~~) NOT AT ARMC
Chlamydia: NEGATIVE
NEISSERIA GONORRHEA: NEGATIVE

## 2018-06-05 LAB — CULTURE, OB URINE

## 2018-06-07 ENCOUNTER — Other Ambulatory Visit (HOSPITAL_COMMUNITY): Payer: Self-pay | Admitting: Family

## 2018-06-07 DIAGNOSIS — Z3682 Encounter for antenatal screening for nuchal translucency: Secondary | ICD-10-CM

## 2018-06-07 DIAGNOSIS — O30001 Twin pregnancy, unspecified number of placenta and unspecified number of amniotic sacs, first trimester: Secondary | ICD-10-CM

## 2018-06-07 DIAGNOSIS — Z3A13 13 weeks gestation of pregnancy: Secondary | ICD-10-CM

## 2018-06-07 LAB — OB RESULTS CONSOLE GC/CHLAMYDIA
CHLAMYDIA, DNA PROBE: NEGATIVE
GC PROBE AMP, GENITAL: NEGATIVE

## 2018-06-07 LAB — URINE CULTURE
GLUCOSE 1 HOUR: 143
Urine Culture, OB: NEGATIVE

## 2018-06-07 LAB — OB RESULTS CONSOLE PLATELET COUNT: Platelets: 313 (ref 150–399)

## 2018-06-07 LAB — OB RESULTS CONSOLE HGB/HCT, BLOOD
HEMATOCRIT: 38 (ref 36–46)
Hemoglobin: 12.8 (ref 12.0–16.0)

## 2018-06-07 LAB — OB RESULTS CONSOLE ANTIBODY SCREEN: Antibody Screen: NEGATIVE

## 2018-06-07 LAB — OB RESULTS CONSOLE ABO/RH: RH TYPE: POSITIVE

## 2018-06-07 LAB — DRUG SCREEN, URINE: Drug Screen, Urine: POSITIVE

## 2018-06-07 LAB — OB RESULTS CONSOLE RPR: RPR: NONREACTIVE

## 2018-06-11 ENCOUNTER — Encounter: Payer: Self-pay | Admitting: Family Medicine

## 2018-06-11 ENCOUNTER — Other Ambulatory Visit: Payer: Self-pay | Admitting: *Deleted

## 2018-06-11 ENCOUNTER — Encounter: Payer: Self-pay | Admitting: *Deleted

## 2018-06-11 ENCOUNTER — Encounter (HOSPITAL_COMMUNITY): Payer: Self-pay

## 2018-06-11 ENCOUNTER — Ambulatory Visit (INDEPENDENT_AMBULATORY_CARE_PROVIDER_SITE_OTHER): Payer: Self-pay | Admitting: Family Medicine

## 2018-06-11 VITALS — BP 118/85 | HR 98

## 2018-06-11 DIAGNOSIS — O30001 Twin pregnancy, unspecified number of placenta and unspecified number of amniotic sacs, first trimester: Secondary | ICD-10-CM

## 2018-06-11 DIAGNOSIS — O30009 Twin pregnancy, unspecified number of placenta and unspecified number of amniotic sacs, unspecified trimester: Secondary | ICD-10-CM

## 2018-06-11 DIAGNOSIS — K219 Gastro-esophageal reflux disease without esophagitis: Secondary | ICD-10-CM

## 2018-06-11 DIAGNOSIS — O99210 Obesity complicating pregnancy, unspecified trimester: Secondary | ICD-10-CM

## 2018-06-11 DIAGNOSIS — O99810 Abnormal glucose complicating pregnancy: Secondary | ICD-10-CM

## 2018-06-11 DIAGNOSIS — O30049 Twin pregnancy, dichorionic/diamniotic, unspecified trimester: Secondary | ICD-10-CM | POA: Insufficient documentation

## 2018-06-11 DIAGNOSIS — O0990 Supervision of high risk pregnancy, unspecified, unspecified trimester: Secondary | ICD-10-CM | POA: Insufficient documentation

## 2018-06-11 DIAGNOSIS — O0991 Supervision of high risk pregnancy, unspecified, first trimester: Secondary | ICD-10-CM

## 2018-06-11 DIAGNOSIS — Z6841 Body Mass Index (BMI) 40.0 and over, adult: Secondary | ICD-10-CM | POA: Insufficient documentation

## 2018-06-11 DIAGNOSIS — O99211 Obesity complicating pregnancy, first trimester: Secondary | ICD-10-CM

## 2018-06-11 LAB — POCT URINALYSIS DIP (DEVICE)
BILIRUBIN URINE: NEGATIVE
GLUCOSE, UA: NEGATIVE mg/dL
KETONES UR: NEGATIVE mg/dL
LEUKOCYTES UA: NEGATIVE
Nitrite: NEGATIVE
PROTEIN: NEGATIVE mg/dL
Specific Gravity, Urine: 1.02 (ref 1.005–1.030)
Urobilinogen, UA: 0.2 mg/dL (ref 0.0–1.0)
pH: 8 (ref 5.0–8.0)

## 2018-06-11 MED ORDER — FAMOTIDINE 20 MG PO TABS: 20.0000 mg | ORAL_TABLET | Freq: Two times a day (BID) | ORAL | 3 refills | Status: DC

## 2018-06-11 MED ORDER — ASPIRIN EC 81 MG PO TBEC: 81.0000 mg | DELAYED_RELEASE_TABLET | Freq: Every day | ORAL | 2 refills | Status: DC

## 2018-06-11 NOTE — Patient Instructions (Addendum)
Childbirth Education Options: [facility] Department Classes:  Childbirth education classes can help you get ready for a positive parenting experience. You can also meet other expectant parents and get free stuff for your baby. Each class runs for five weeks on the same night and costs $45 for the mother-to-be and her support person. Medicaid covers the cost if you are eligible. Call [phone] to register. [facility] Childbirth Education:  [phone] or [phone] or [email or web address]  Baby & Me Class: Discuss newborn & infant parenting and family adjustment issues with other new mothers in a relaxed environment. Each week brings a new speaker or baby-centered activity. We encourage new mothers to join [place] every Thursday at 11:00am. Babies birth until crawling. No registration or fee. Daddy [employer]: This course offers Dads-to-be the tools and knowledge needed to feel confident on their journey to becoming new fathers. Experienced dads, who have been trained as coaches, teach dads-to-be how to hold, comfort, diaper, swaddle and play with their infant while being able to support the new mom as well. A class for men taught by men. $25/dad Big Brother/Big Sister: Let your children share in the joy of a new brother or sister in this special class designed just for them. Class includes discussion about how families care for babies: swaddling, holding, diapering, safety as well as how they can be helpful in their new role. This class is designed for children ages 45 to 48, but any age is welcome. Please register each child individually. $5/child  Mom Talk: This mom-led group offers support and connection to mothers as they journey through the adjustments and struggles of that sometimes overwhelming first year after the birth of a child. Tuesdays at 10:00am and Thursdays at 6:00pm. Babies welcome. No registration or fee. Breastfeeding Support Group: This group is a mother-to-mother  support circle where moms have the opportunity to share their breastfeeding experiences. A Lactation Consultant is present for questions and concerns. Meets each Tuesday at 11:00am. No fee or registration. Breastfeeding Your Baby: Learn what to expect in the first days of breastfeeding your newborn.  This class will help you feel more confident with the skills needed to begin your breastfeeding experience. Many new mothers are concerned about breastfeeding after leaving the hospital. This class will also address the most common fears and challenges about breastfeeding during the first few weeks, months and beyond. (call for fee) Comfort Techniques and Tour: This 2 hour interactive class will provide you the opportunity to learn & practice hands-on techniques that can help relieve some of the discomfort of labor and encourage your baby to rotate toward the best position for birth. You and your partner will be able to try a variety of labor positions with birth balls and rebozos as well as practice breathing, relaxation, and visualization techniques. A tour of the [facility] is included with this class. $20 per registrant and support person Childbirth Class- Weekend Option: This class is a Weekend version of our Birth & Baby series. It is designed for parents who have a difficult time fitting several weeks of classes into their schedule. It covers the care of your newborn and the basics of labor and childbirth. It also includes a [place] of [facility] and lunch. The class is held two consecutive days: beginning on Friday evening from 6:30 - 8:30 p.m. and the next day, Saturday from 9 a.m. - 4 p.m. (call for fee) Doren Custard Class: Interested in a waterbirth?  This  informational class will help you discover whether waterbirth is the right fit for you. Education about waterbirth itself, supplies you would need and how to assemble your support team is what you can  expect from this class. Some obstetrical practices require this class in order to pursue a waterbirth. (Not all obstetrical practices offer waterbirth-check with your healthcare provider.) Register only the expectant mom, but you are encouraged to bring your partner to class! Required if planning waterbirth, no fee. Infant/Child CPR: Parents, grandparents, babysitters, and friends learn Cardio-Pulmonary Resuscitation skills for infants and children. You will also learn how to treat both conscious and unconscious choking in infants and children. This Family & Friends program does not offer certification. Register each participant individually to ensure that enough mannequins are available. (Call for fee) Grandparent Love: Expecting a grandbaby? This class is for you! Learn about the latest infant care and safety recommendations and ways to support your own child as he or she transitions into the parenting role. Taught by Registered Nurses who are childbirth instructors, but most importantly...they are grandmothers too! $10/person. Childbirth Class- Natural Childbirth: This series of 5 weekly classes is for expectant parents who want to learn and practice natural methods of coping with the process of labor and childbirth. Relaxation, breathing, massage, visualization, role of the partner, and helpful positioning are highlighted. Participants learn how to be confident in their body's ability to give birth. This class will empower and help parents make informed decisions about their own care. Includes discussion that will help new parents transition into the immediate postpartum period. Maternity Care Center Tour of [facility] is included. We suggest taking this class between 25-32 weeks, but it's only a recommendation. $75 per registrant and one support person or $30 Medicaid. Childbirth Class- 3 week Series: This option of 3 weekly classes helps you and your labor partner prepare for childbirth. Newborn  care, labor & birth, cesarean birth, pain management, and comfort techniques are discussed and a Maternity Care Center Tour of [facility] is included. The class meets at the same time, on the same day of the week for 3 consecutive weeks beginning with the starting date you choose. $60 for registrant and one support person.  Marvelous Multiples: Expecting twins, triplets, or more? This class covers the differences in labor, birth, parenting, and breastfeeding issues that face multiples' parents. NICU tour is included. Led by a Certified Childbirth Educator who is the mother of twins. No fee. Caring for Baby: This class is for expectant and adoptive parents who want to learn and practice the most up-to-date newborn care for their babies. Focus is on birth through the first six weeks of life. Topics include feeding, bathing, diapering, crying, umbilical cord care, circumcision care and safe sleep. Parents learn to recognize symptoms of illness and when to call the pediatrician. Register only the mom-to-be and your partner or support person can plan to come with you! $10 per registrant and support person Childbirth Class- online option: This online class offers you the freedom to complete a Birth and Baby series in the comfort of your own home. The flexibility of this option allows you to review sections at your own pace, at times convenient to you and your support people. It includes additional video information, animations, quizzes, and extended activities. Get organized with helpful eClass tools, checklists, and trackers. Once you register online for the class, you will receive an email within a few days to accept the invitation and begin the class when the time   is right for you. The content will be available to you for 60 days. $60 for 60 days of online access for you and your support people.  Local Doulas: Natural Baby Doulas [email or web address] Tel:  [phone] [email or web address] [employer] [phone] [email or web address] The Labor Hassell Halim  (also do waterbirth tub rental) [phone] [email or web address] Triad Birth Doula [phone] [email or web address].fi Sacred Rhythms  [phone] https://[email or web address]-[email or web address]/ [employer] (PADA) [email or web address].[email or web address]/ [place] Birth and Baby  [email or web address]/ Considering Waterbirth? Guide for patients at [facility] [employer]  Why consider waterbirth?  . Gentle birth for babies . Less pain medicine used in labor . May allow for passive descent/less pushing . May reduce perineal tears  . More mobility and instinctive maternal position changes . Increased maternal relaxation . Reduced blood pressure in labor  Is waterbirth safe? What are the risks of infection, drowning or other complications?  . Infection: o Very low risk (3.7 % for tub vs 4.8% for bed) o 7 in 8000 waterbirths with documented infection o Poorly cleaned equipment most common cause o Slightly lower group B strep transmission rate  . Drowning o Maternal:  - Very low risk   - Related to seizures or fainting o Newborn:  - Very low risk. No evidence of increased risk of respiratory problems in multiple large studies - Physiological protection from breathing under water - Avoid underwater birth if there are any fetal complications - Once baby's head is out of the water, keep it out.  . Birth complication o Some reports of cord trauma, but risk decreased by bringing baby to surface gradually o No evidence of increased risk of shoulder dystocia. Mothers can usually change positions faster in water than in a bed, possibly aiding the maneuvers to free the shoulder.   You must attend a Doren Custard class at [facility]  3rd Wednesday of every month from 7-9pm  [employer] by calling [phone] or online at [email or web address]  Bring [place] the certificate from the class to your prenatal appointment  Meet with a midwife at 36 weeks to see if you can still plan a waterbirth and to sign the consent.   Purchase or rent the following supplies:   Water Birth Pool (Birth Pool in a Box or [place] for instance)  (Tubs start ~$125)  Single-use disposable tub liner designed for your brand of tub  New garden hose labeled "lead-free", "suitable for drinking water",  Electric drain pump to remove water (We recommend 792 gallon per hour or greater pump.)   Separate garden hose to remove the dirty water  Fish net  Bathing suit top (optional)  Long-handled mirror (optional)  Places to purchase or rent supplies  GotWebTools.is for tub purchases and supplies  Waterbirthsolutions.com for tub purchases and supplies  The Labor Ladies (www.[email or web address]) $275 for tub rental/set-up & take down/kit   [employer] (http://www.fleming.com/.htm) Information regarding doulas (labor support) who provide pool rentals  Our practice has a Birth Pool in a Box tub at the hospital that you may borrow on a first-come-first-served basis. It is your responsibility to to set up, clean and break down the tub. We cannot guarantee the availability of this tub in advance. You are responsible for bringing all accessories listed above. If you do not have all necessary supplies you cannot have a waterbirth.    Things that would prevent you from having a waterbirth:  Premature, <37wks  Previous cesarean birth  Presence of thick meconium-stained fluid  Multiple gestation (Twins,  triplets, etc.)  Uncontrolled diabetes or gestational diabetes requiring medication  Hypertension requiring medication or diagnosis of pre-eclampsia  Heavy vaginal bleeding  Non-reassuring fetal  heart rate  Active infection (MRSA, etc.). Group B Strep is NOT a contraindication for  waterbirth.  If your labor has to be induced and induction method requires continuous  monitoring of the baby's heart rate  Other risks/issues identified by your obstetrical provider  Please remember that birth is unpredictable. Under certain unforeseeable circumstances your provider may advise against giving birth in the tub. These decisions will be made on a case-by-case basis and with the safety of you and your baby as our highest priority.   Twin Dietary Recommendations:  . Eat every 3-4 hours: at least 3 meals plus 2-3 snacks per day . Iron supplement twice a day and lean protein: fish, poultry, dairy, nuts . Complex carbohydrates: fruits, legumes, and whole grains . Daily mineral supplements: Calcium 3g, Magnesium 1.2g, Zinc '45mg'$  . Healthy fats: mono and poly unsaturated (olive oil, canola oil, nuts, seeds). Limit saturated fats and trans fats . Omega-3 fatty acid rich fish protein 2-4x per week . BMI specific dietary recommendations for calories and protein   Dietary Recommendations in Pregnancy:      Twins:      Non-pregnant Single Baby  Underweight (BMI < 19.8) Normal Weight (BMI 20 -26) Overweight (BMI 26-29) Obese (BMI >29)  (BMI >29)         Calories (Kcal) 2200 2500  4000 3500 3250 3000  Protein (g) 110 126  200 175 163 150  Carbohydrate (g) 220 248  400 350 325 300  Fat (g) 98 112  178 156 144 133

## 2018-06-11 NOTE — Progress Notes (Signed)
Here for new ob appt. Transfer from [facility].  Given new patient booklets. Already has MyChart account. Sent Babyscripts  App. Declines flu shot.

## 2018-06-11 NOTE — Progress Notes (Signed)
Subjective:  Autumn Duarte is a G1P0 23w0dbeing seen today for her first obstetrical visit.  Her obstetrical history is significant for di/di twins, morbid obesity. [facility] here due to the twin gestation. Patient does intend to breast feed. Pregnancy history fully reviewed.  Patient reports nausea.  BP 118/85   Pulse 98   LMP 03/19/2018   HISTORY: OB History  Gravida Para Term Preterm AB Living  1            SAB TAB Ectopic Multiple Live Births               # Outcome Date GA Lbr Len/2nd Weight Sex Delivery Anes PTL Lv  1 Current             Past Medical History:  Diagnosis Date  . Asthma   . Bronchitis   . Eczema   . Obesity   . Seasonal allergies     Past Surgical History:  Procedure Laterality Date  . NO PAST SURGERIES    . WISDOM TOOTH EXTRACTION      Family History  Problem Relation Age of Onset  . Arthritis Mother   . Hepatitis Mother   . Thyroid disease Mother   . Diabetes Father   . Heart disease Father   . Hypertension Father   . Breast cancer Maternal Aunt   . Breast cancer Maternal Grandmother      Exam  BP 118/85   Pulse 98   LMP 03/19/2018   CONSTITUTIONAL: Well-developed, well-nourished female in no acute distress.  HENT:  Normocephalic, atraumatic, External right and left ear normal. Oropharynx is clear and moist EYES: Conjunctivae and EOM are normal. Pupils are equal, round, and reactive to light. No scleral icterus.  CARDIOVASCULAR: Normal heart rate noted, regular rhythm RESPIRATORY: Clear to auscultation bilaterally. Effort and breath sounds normal, no problems with respiration noted. ABDOMEN: Soft, normal bowel sounds, no distention noted.  No tenderness, rebound or guarding.  MUSCULOSKELETAL: Normal range of motion. No tenderness.  No cyanosis, clubbing, or edema.  2+ distal pulses. SKIN: Skin is warm and dry. No rash noted. Not diaphoretic. No erythema. No  pallor. NEUROLOGIC: Alert and oriented to person, place, and time. Normal reflexes, muscle tone coordination. No cranial nerve deficit noted. PSYCHIATRIC: Normal mood and affect. Normal behavior. Normal judgment and thought content.    Assessment:    Pregnancy: G1P0 Patient Active Problem List   Diagnosis Date Noted  . Supervision of high risk pregnancy, antepartum 06/11/2018  . Twin pregnancy, antepartum 06/11/2018  . Morbid obesity with BMI of 50.0-59.9, [place] 06/11/2018  . Obesity in pregnancy 06/11/2018  . PROBLEMS WITH HEARING 11/18/2008  . ECZEMA 04/16/2007      Plan:   1. Supervision of high risk pregnancy, antepartum FHTs normal - CHL AMB BABYSCRIPTS OPT IN - [place]FM OB DETAIL +14 WK; Future - [place]FM OB DETAIL ADDL GEST +14 WK; Future - aspirin EC 81 MG tablet; Take 1 tablet (81 mg total) by mouth daily. Take after 12 weeks for prevention of preeclampsia later in pregnancy  Dispense: 300 tablet; Refill: 2 - Comp Met (CMET) - Protein / creatinine ratio, urine  2. Twin pregnancy, antepartum, unspecified multiple gestation type Start ASA 815mCMP USKoreacheduled Patient scheduled for 1st screen Discussed weight gain in pregnancy, diet requirements. - CHL AMB BABYSCRIPTS OPT IN - US[place]oreaFM OB DETAIL +14 WK; Future - US[place]oreaFM OB DETAIL  ADDL GEST +14 WK; Future - aspirin EC 81 MG tablet; Take 1 tablet (81 mg total) by mouth daily. Take after 12 weeks for prevention of preeclampsia later in pregnancy  Dispense: 300 tablet; Refill: 2 - Comp Met (CMET) - Protein / creatinine ratio, urine  3. Morbid obesity with BMI of 50.0-59.9, adult ([place])  4. GERD Pepcid  Problem list reviewed and updated. 75% of 45 min visit spent on counseling and coordination of care.     Truett Mainland 06/11/2018

## 2018-06-12 LAB — COMPREHENSIVE METABOLIC PANEL
ALBUMIN: 3.9 g/dL (ref 3.5–5.5)
ALK PHOS: 31 IU/L — AB (ref 39–117)
ALT: 29 IU/L (ref 0–32)
AST: 22 IU/L (ref 0–40)
Albumin/Globulin Ratio: 1.4 (ref 1.2–2.2)
BUN / CREAT RATIO: 12 (ref 9–23)
BUN: 8 mg/dL (ref 6–20)
Bilirubin Total: 0.3 mg/dL (ref 0.0–1.2)
CO2: 19 mmol/L — AB (ref 20–29)
CREATININE: 0.67 mg/dL (ref 0.57–1.00)
Calcium: 9.3 mg/dL (ref 8.7–10.2)
Chloride: 104 mmol/L (ref 96–106)
GFR calc Af Amer: 146 mL/min/{1.73_m2} (ref >59)
GFR calc non Af Amer: 127 mL/min/{1.73_m2} (ref >59)
GLUCOSE: 78 mg/dL (ref 65–99)
Globulin, Total: 2.8 g/dL (ref 1.5–4.5)
Potassium: 4.1 mmol/L (ref 3.5–5.2)
Sodium: 138 mmol/L (ref 134–144)
Total Protein: 6.7 g/dL (ref 6.0–8.5)

## 2018-06-12 LAB — PROTEIN / CREATININE RATIO, URINE
CREATININE, UR: 185.9 mg/dL
Protein, Ur: 10.6 mg/dL
Protein/Creat Ratio: 57 mg/g creat (ref 0–200)

## 2018-06-13 ENCOUNTER — Other Ambulatory Visit: Payer: Self-pay

## 2018-06-13 DIAGNOSIS — O0990 Supervision of high risk pregnancy, unspecified, unspecified trimester: Secondary | ICD-10-CM

## 2018-06-13 DIAGNOSIS — O30009 Twin pregnancy, unspecified number of placenta and unspecified number of amniotic sacs, unspecified trimester: Secondary | ICD-10-CM

## 2018-06-13 DIAGNOSIS — O99810 Abnormal glucose complicating pregnancy: Secondary | ICD-10-CM

## 2018-06-13 DIAGNOSIS — O99210 Obesity complicating pregnancy, unspecified trimester: Secondary | ICD-10-CM

## 2018-06-14 LAB — GLUCOSE TOLERANCE, 2 HOURS W/ 1HR
GLUCOSE, 2 HOUR: 81 mg/dL (ref 65–152)
GLUCOSE, FASTING: 91 mg/dL (ref 65–91)
Glucose, 1 hour: 120 mg/dL (ref 65–179)

## 2018-06-19 ENCOUNTER — Encounter (HOSPITAL_COMMUNITY): Payer: Self-pay

## 2018-06-19 ENCOUNTER — Ambulatory Visit (HOSPITAL_COMMUNITY)
Admission: RE | Admit: 2018-06-19 | Discharge: 2018-06-19 | Disposition: A | Discharge status: Home / Self Care | Payer: Medicaid Other | Source: Ambulatory Visit | Attending: Family | Admitting: Family

## 2018-06-19 DIAGNOSIS — O30041 Twin pregnancy, dichorionic/diamniotic, first trimester: Secondary | ICD-10-CM

## 2018-06-19 DIAGNOSIS — Z3682 Encounter for antenatal screening for nuchal translucency: Secondary | ICD-10-CM

## 2018-06-19 DIAGNOSIS — O30009 Twin pregnancy, unspecified number of placenta and unspecified number of amniotic sacs, unspecified trimester: Secondary | ICD-10-CM

## 2018-06-19 DIAGNOSIS — Z3A13 13 weeks gestation of pregnancy: Secondary | ICD-10-CM

## 2018-06-19 DIAGNOSIS — O99211 Obesity complicating pregnancy, first trimester: Secondary | ICD-10-CM | POA: Diagnosis not present

## 2018-06-19 DIAGNOSIS — O30001 Twin pregnancy, unspecified number of placenta and unspecified number of amniotic sacs, first trimester: Secondary | ICD-10-CM

## 2018-06-19 DIAGNOSIS — O0990 Supervision of high risk pregnancy, unspecified, unspecified trimester: Secondary | ICD-10-CM

## 2018-06-19 DIAGNOSIS — O99210 Obesity complicating pregnancy, unspecified trimester: Secondary | ICD-10-CM

## 2018-06-25 ENCOUNTER — Other Ambulatory Visit (HOSPITAL_COMMUNITY): Payer: Self-pay

## 2018-06-26 ENCOUNTER — Inpatient Hospital Stay (HOSPITAL_COMMUNITY)
Admission: AD | Admit: 2018-06-26 | Discharge: 2018-06-26 | Disposition: A | Discharge status: Home / Self Care | Payer: Medicaid Other | Source: Ambulatory Visit | Attending: Obstetrics and Gynecology | Admitting: Obstetrics and Gynecology

## 2018-06-26 ENCOUNTER — Encounter (HOSPITAL_COMMUNITY): Payer: Self-pay

## 2018-06-26 DIAGNOSIS — Z79899 Other long term (current) drug therapy: Secondary | ICD-10-CM | POA: Diagnosis not present

## 2018-06-26 DIAGNOSIS — O219 Vomiting of pregnancy, unspecified: Secondary | ICD-10-CM | POA: Diagnosis not present

## 2018-06-26 DIAGNOSIS — O99282 Endocrine, nutritional and metabolic diseases complicating pregnancy, second trimester: Secondary | ICD-10-CM | POA: Insufficient documentation

## 2018-06-26 DIAGNOSIS — O99512 Diseases of the respiratory system complicating pregnancy, second trimester: Secondary | ICD-10-CM | POA: Diagnosis not present

## 2018-06-26 DIAGNOSIS — E860 Dehydration: Secondary | ICD-10-CM | POA: Diagnosis not present

## 2018-06-26 DIAGNOSIS — O99210 Obesity complicating pregnancy, unspecified trimester: Secondary | ICD-10-CM

## 2018-06-26 DIAGNOSIS — O99212 Obesity complicating pregnancy, second trimester: Secondary | ICD-10-CM | POA: Insufficient documentation

## 2018-06-26 DIAGNOSIS — R109 Unspecified abdominal pain: Secondary | ICD-10-CM | POA: Diagnosis present

## 2018-06-26 DIAGNOSIS — O99312 Alcohol use complicating pregnancy, second trimester: Secondary | ICD-10-CM

## 2018-06-26 DIAGNOSIS — Z87891 Personal history of nicotine dependence: Secondary | ICD-10-CM | POA: Insufficient documentation

## 2018-06-26 DIAGNOSIS — O210 Mild hyperemesis gravidarum: Secondary | ICD-10-CM | POA: Diagnosis present

## 2018-06-26 DIAGNOSIS — J45909 Unspecified asthma, uncomplicated: Secondary | ICD-10-CM | POA: Diagnosis not present

## 2018-06-26 DIAGNOSIS — Z3A14 14 weeks gestation of pregnancy: Secondary | ICD-10-CM | POA: Diagnosis not present

## 2018-06-26 DIAGNOSIS — O26892 Other specified pregnancy related conditions, second trimester: Secondary | ICD-10-CM | POA: Diagnosis present

## 2018-06-26 DIAGNOSIS — O0990 Supervision of high risk pregnancy, unspecified, unspecified trimester: Secondary | ICD-10-CM

## 2018-06-26 LAB — CBC
HCT: 35.1 % — ABNORMAL LOW (ref 36.0–46.0)
Hemoglobin: 12.2 g/dL (ref 12.0–15.0)
MCH: 31.5 pg (ref 26.0–34.0)
MCHC: 34.8 g/dL (ref 30.0–36.0)
MCV: 90.7 fL (ref 80.0–100.0)
PLATELETS: 230 10*3/uL (ref 150–400)
RBC: 3.87 MIL/uL (ref 3.87–5.11)
RDW: 13.9 % (ref 11.5–15.5)
WBC: 8.1 10*3/uL (ref 4.0–10.5)
nRBC: 0 % (ref 0.0–0.2)

## 2018-06-26 LAB — COMPREHENSIVE METABOLIC PANEL
ALK PHOS: 22 U/L — AB (ref 38–126)
ALT: 20 U/L (ref 0–44)
ANION GAP: 9 (ref 5–15)
AST: 17 U/L (ref 15–41)
Albumin: 3.3 g/dL — ABNORMAL LOW (ref 3.5–5.0)
BUN: 9 mg/dL (ref 6–20)
CALCIUM: 8.8 mg/dL — AB (ref 8.9–10.3)
CHLORIDE: 105 mmol/L (ref 98–111)
CO2: 21 mmol/L — AB (ref 22–32)
Creatinine, Ser: 0.65 mg/dL (ref 0.44–1.00)
GFR calc non Af Amer: >60 mL/min (ref >60)
Glucose, Bld: 117 mg/dL — ABNORMAL HIGH (ref 70–99)
POTASSIUM: 3.5 mmol/L (ref 3.5–5.1)
SODIUM: 135 mmol/L (ref 135–145)
Total Bilirubin: 0.5 mg/dL (ref 0.3–1.2)
Total Protein: 6.7 g/dL (ref 6.5–8.1)

## 2018-06-26 LAB — URINALYSIS, ROUTINE W REFLEX MICROSCOPIC
Bilirubin Urine: NEGATIVE
Glucose, UA: NEGATIVE mg/dL
Hgb urine dipstick: NEGATIVE
KETONES UR: 20 mg/dL — AB
NITRITE: NEGATIVE
PROTEIN: NEGATIVE mg/dL
Specific Gravity, Urine: 1.02 (ref 1.005–1.030)
pH: 7 (ref 5.0–8.0)

## 2018-06-26 MED ORDER — ONDANSETRON 4 MG PO TBDP: 4.0000 mg | ORAL_TABLET | Freq: Three times a day (TID) | ORAL | 0 refills | Status: DC | PRN

## 2018-06-26 MED ORDER — LACTATED RINGERS IV BOLUS
1000.0000 mL | Freq: Once | INTRAVENOUS | Status: AC
  Administered 2018-06-26: 1000 mL via INTRAVENOUS

## 2018-06-26 MED ORDER — SODIUM CHLORIDE 0.9 % IV SOLN
8.0000 mg | Freq: Once | INTRAVENOUS | Status: AC
  Administered 2018-06-26: 8 mg via INTRAVENOUS
  Filled 2018-06-26: qty 4

## 2018-06-26 NOTE — Discharge Instructions (Signed)

## 2018-06-26 NOTE — MAU Provider Note (Signed)
History     CSN: [id number]  Arrival date and time: 06/26/18 1443   First Provider Initiated Contact with Patient 06/26/18 1531      Chief Complaint  Patient presents with  . Abdominal Pain  . Emesis  . Nausea   G1 @14 .1 wks with twins here with N/V and abd pain. She's had ongoing nausea during the pregnancy that resolved about 1 week ago then started back this am with abd pain. Reports emesis x4 today. Unable to tolerate po food or fluids. No fevers. No diarrhea. No sick contacts. Abd pain is bilateral and central. Describes as cramping. Rates 4/10. Has not taken anything for it. No VB. No urinary sx.    OB History    Gravida  1   Para      Term      Preterm      AB      Living  0     SAB      TAB      Ectopic      Multiple      Live Births              Past Medical History:  Diagnosis Date  . Asthma   . Bronchitis   . Eczema   . Obesity   . Seasonal allergies     Past Surgical History:  Procedure Laterality Date  . WISDOM TOOTH EXTRACTION      Family History  Problem Relation Age of Onset  . Arthritis Mother   . Hepatitis Mother   . Thyroid disease Mother   . Diabetes Father   . Heart disease Father   . Hypertension Father   . Breast cancer Maternal Aunt   . Breast cancer Maternal Grandmother     Social History   Tobacco Use  . Smoking status: Former Smoker    Types: Cigarettes    Last attempt to quit: 03/19/2018    Years since quitting: 0.2  . Smokeless tobacco: Never Used  Substance Use Topics  . Alcohol use: Never    Frequency: Never  . Drug use: Never    Allergies: No Known Allergies  Medications Prior to Admission  Medication Sig Dispense Refill Last Dose  . albuterol (PROVENTIL HFA;VENTOLIN HFA) 108 (90 Base) MCG/ACT inhaler Inhale into the lungs.   Taking  . aspirin EC 81 MG tablet Take 1 tablet (81 mg total) by mouth daily. Take after 12 weeks for prevention of preeclampsia later in pregnancy 300 tablet 2 Taking   . butalbital-acetaminophen-caffeine (FIORICET, ESGIC) 50-325-40 MG tablet Take 1 tablet by mouth every 6 (six) hours as needed for headache. (Patient not taking: Reported on 06/19/2018) 20 tablet 0 Not Taking  . doxylamine, Sleep, (UNISOM) 25 MG tablet Take 25 mg by mouth at bedtime.   Taking  . famotidine (PEPCID) 20 MG tablet Take 1 tablet (20 mg total) by mouth 2 (two) times daily. 60 tablet 3 Taking  . metoCLOPramide (REGLAN) 10 MG tablet Take 1 tablet (10 mg total) by mouth every 6 (six) hours as needed for nausea or vomiting. 15 tablet 0 Taking  . Prenatal Vit-Fe Fumarate-FA (PRENATAL MULTIVITAMIN) TABS tablet Take 1 tablet by mouth daily at 12 noon.   Taking    Review of Systems  Constitutional: Negative for fever.  Gastrointestinal: Positive for abdominal pain, nausea and vomiting. Negative for constipation and diarrhea.  Genitourinary: Negative for dysuria and vaginal bleeding.  Neurological: Positive for headaches.   Physical Exam   Blood  pressure 138/86, pulse (!) 103, temperature 98.5 F (36.9 C), temperature source Oral, resp. rate 16, height 5\' 3"  (1.6 m), weight 132 kg, last menstrual period 03/19/2018, SpO2 99 %.  Physical Exam  Constitutional: She is oriented to person, place, and time. She appears well-developed and well-nourished. No distress.  HENT:  Head: Normocephalic and atraumatic.  Neck: Normal range of motion.  Respiratory: Effort normal. No respiratory distress.  GI: Soft. She exhibits no distension and no mass. There is no tenderness. There is no rebound and no guarding.  Genitourinary:  Genitourinary Comments: Cervix closed/long  Musculoskeletal: Normal range of motion.  Neurological: She is alert and oriented to person, place, and time.  Skin: Skin is warm and dry.  Psychiatric: She has a normal mood and affect.  FHT A 140 FHT B 150  Results for orders placed or performed during the hospital encounter of 06/26/18 (from the past 24 hour(s))   Urinalysis, Routine w reflex microscopic     Status: Abnormal   Collection Time: 06/26/18  3:03 PM  Result Value Ref Range   Color, Urine YELLOW YELLOW   APPearance CLOUDY (A) CLEAR   Specific Gravity, Urine 1.020 1.005 - 1.030   pH 7.0 5.0 - 8.0   Glucose, UA NEGATIVE NEGATIVE mg/dL   Hgb urine dipstick NEGATIVE NEGATIVE   Bilirubin Urine NEGATIVE NEGATIVE   Ketones, ur 20 (A) NEGATIVE mg/dL   Protein, ur NEGATIVE NEGATIVE mg/dL   Nitrite NEGATIVE NEGATIVE   Leukocytes, UA LARGE (A) NEGATIVE   RBC / HPF 0-5 0 - 5 RBC/hpf   WBC, UA 11-20 0 - 5 WBC/hpf   Bacteria, UA RARE (A) NONE SEEN   Squamous Epithelial / LPF 11-20 0 - 5   Mucus PRESENT   CBC     Status: Abnormal   Collection Time: 06/26/18  3:54 PM  Result Value Ref Range   WBC 8.1 4.0 - 10.5 K/uL   RBC 3.87 3.87 - 5.11 MIL/uL   Hemoglobin 12.2 12.0 - 15.0 g/dL   HCT 16.1 (L) 09.6 - 04.5 %   MCV 90.7 80.0 - 100.0 fL   MCH 31.5 26.0 - 34.0 pg   MCHC 34.8 30.0 - 36.0 g/dL   RDW 40.9 81.1 - 91.4 %   Platelets 230 150 - 400 K/uL   nRBC 0.0 0.0 - 0.2 %  Comprehensive metabolic panel     Status: Abnormal   Collection Time: 06/26/18  3:54 PM  Result Value Ref Range   Sodium 135 135 - 145 mmol/L   Potassium 3.5 3.5 - 5.1 mmol/L   Chloride 105 98 - 111 mmol/L   CO2 21 (L) 22 - 32 mmol/L   Glucose, Bld 117 (H) 70 - 99 mg/dL   BUN 9 6 - 20 mg/dL   Creatinine, Ser 7.82 0.44 - 1.00 mg/dL   Calcium 8.8 (L) 8.9 - 10.3 mg/dL   Total Protein 6.7 6.5 - 8.1 g/dL   Albumin 3.3 (L) 3.5 - 5.0 g/dL   AST 17 15 - 41 U/L   ALT 20 0 - 44 U/L   Alkaline Phosphatase 22 (L) 38 - 126 U/L   Total Bilirubin 0.5 0.3 - 1.2 mg/dL   GFR calc non Af Amer >60 >60 mL/min   GFR calc Af Amer >60 >60 mL/min   Anion gap 9 5 - 15   MAU Course  Procedures LR Zofran  MDM Labs ordered and reviewed. Feeling better after meds. No further emesis. Tolerating po. Stable for  discharge home.   Assessment and Plan   1. [redacted] weeks gestation of pregnancy    2. Supervision of high risk pregnancy, antepartum   3. Obesity in pregnancy   4. Dehydration   5. Nausea/vomiting in pregnancy    Discharge home Follow up in OB office as scheduled Rx Zofran Hydrate  Allergies as of 06/26/2018   No Known Allergies     Medication List    STOP taking these medications   metoCLOPramide 10 MG tablet Commonly known as:  REGLAN     TAKE these medications   albuterol 108 (90 Base) MCG/ACT inhaler Commonly known as:  PROVENTIL HFA;VENTOLIN HFA Inhale into the lungs.   aspirin EC 81 MG tablet Take 1 tablet (81 mg total) by mouth daily. Take after 12 weeks for prevention of preeclampsia later in pregnancy   butalbital-acetaminophen-caffeine 50-325-40 MG tablet Commonly known as:  FIORICET, ESGIC Take 1 tablet by mouth every 6 (six) hours as needed for headache.   doxylamine (Sleep) 25 MG tablet Commonly known as:  UNISOM Take 25 mg by mouth at bedtime.   famotidine 20 MG tablet Commonly known as:  PEPCID Take 1 tablet (20 mg total) by mouth 2 (two) times daily.   ondansetron 4 MG disintegrating tablet Commonly known as:  ZOFRAN-ODT Take 1 tablet (4 mg total) by mouth every 8 (eight) hours as needed for nausea or vomiting.   prenatal multivitamin Tabs tablet Take 1 tablet by mouth daily at 12 noon.      Donette LarryMelanie Khyleigh Furney, CNM 06/26/2018, 3:38 PM

## 2018-06-26 NOTE — MAU Note (Signed)
Pt reports lower abd pain since 6 am today, nausea and vomiting.

## 2018-06-27 ENCOUNTER — Other Ambulatory Visit: Payer: Self-pay | Admitting: Certified Nurse Midwife

## 2018-07-03 ENCOUNTER — Encounter (HOSPITAL_COMMUNITY): Payer: Self-pay | Admitting: *Deleted

## 2018-07-03 ENCOUNTER — Inpatient Hospital Stay (HOSPITAL_COMMUNITY)
Admission: AD | Admit: 2018-07-03 | Discharge: 2018-07-03 | Disposition: A | Discharge status: Home / Self Care | Payer: Medicaid Other | Source: Ambulatory Visit | Attending: Obstetrics and Gynecology | Admitting: Obstetrics and Gynecology

## 2018-07-03 ENCOUNTER — Inpatient Hospital Stay (HOSPITAL_BASED_OUTPATIENT_CLINIC_OR_DEPARTMENT_OTHER): Payer: Medicaid Other

## 2018-07-03 DIAGNOSIS — Z87891 Personal history of nicotine dependence: Secondary | ICD-10-CM | POA: Insufficient documentation

## 2018-07-03 DIAGNOSIS — O0990 Supervision of high risk pregnancy, unspecified, unspecified trimester: Secondary | ICD-10-CM

## 2018-07-03 DIAGNOSIS — O99212 Obesity complicating pregnancy, second trimester: Secondary | ICD-10-CM | POA: Diagnosis not present

## 2018-07-03 DIAGNOSIS — Z3686 Encounter for antenatal screening for cervical length: Secondary | ICD-10-CM

## 2018-07-03 DIAGNOSIS — O30042 Twin pregnancy, dichorionic/diamniotic, second trimester: Secondary | ICD-10-CM | POA: Diagnosis not present

## 2018-07-03 DIAGNOSIS — O26852 Spotting complicating pregnancy, second trimester: Secondary | ICD-10-CM | POA: Diagnosis not present

## 2018-07-03 DIAGNOSIS — E669 Obesity, unspecified: Secondary | ICD-10-CM | POA: Insufficient documentation

## 2018-07-03 DIAGNOSIS — O26892 Other specified pregnancy related conditions, second trimester: Secondary | ICD-10-CM

## 2018-07-03 DIAGNOSIS — O4692 Antepartum hemorrhage, unspecified, second trimester: Secondary | ICD-10-CM | POA: Diagnosis not present

## 2018-07-03 DIAGNOSIS — R102 Pelvic and perineal pain: Secondary | ICD-10-CM | POA: Diagnosis not present

## 2018-07-03 DIAGNOSIS — O26899 Other specified pregnancy related conditions, unspecified trimester: Secondary | ICD-10-CM

## 2018-07-03 DIAGNOSIS — Z3A15 15 weeks gestation of pregnancy: Secondary | ICD-10-CM

## 2018-07-03 DIAGNOSIS — O99210 Obesity complicating pregnancy, unspecified trimester: Secondary | ICD-10-CM

## 2018-07-03 LAB — URINALYSIS, ROUTINE W REFLEX MICROSCOPIC
Bilirubin Urine: NEGATIVE
GLUCOSE, UA: NEGATIVE mg/dL
Ketones, ur: NEGATIVE mg/dL
NITRITE: NEGATIVE
PH: 6 (ref 5.0–8.0)
Protein, ur: NEGATIVE mg/dL
SPECIFIC GRAVITY, URINE: 1.023 (ref 1.005–1.030)

## 2018-07-03 LAB — WET PREP, GENITAL
CLUE CELLS WET PREP: NONE SEEN
Trich, Wet Prep: NONE SEEN
Yeast Wet Prep HPF POC: NONE SEEN

## 2018-07-03 MED ORDER — PROMETHAZINE HCL 25 MG PO TABS: 25.0000 mg | ORAL_TABLET | Freq: Four times a day (QID) | ORAL | 0 refills | Status: DC | PRN

## 2018-07-03 MED ORDER — PROMETHAZINE HCL 25 MG PO TABS
25.0000 mg | ORAL_TABLET | Freq: Four times a day (QID) | ORAL | Status: DC | PRN
  Administered 2018-07-03: 25 mg via ORAL
  Filled 2018-07-03: qty 1

## 2018-07-03 NOTE — MAU Provider Note (Signed)
History    Chief Complaint  Patient presents with  . Vaginal Bleeding   Has been having lower abdominal pain, mostly right side off and on for the past several weeks. Exacerbated by movement, goes away with warm baths. Was sharper this morning and accompanied by bright pink spotting. Patient reports constipation that recently resolved and sexual activity last night. Also still experiencing nausea with emesis x1 this morning. Has not had doxylamine filled due to cost.   Past Medical History:  Diagnosis Date  . Asthma   . Bronchitis   . Eczema   . Obesity   . Seasonal allergies     Past Surgical History:  Procedure Laterality Date  . WISDOM TOOTH EXTRACTION      Family History  Problem Relation Age of Onset  . Arthritis Mother   . Hepatitis Mother   . Thyroid disease Mother   . Diabetes Father   . Heart disease Father   . Hypertension Father   . Breast cancer Maternal Aunt   . Breast cancer Maternal Grandmother     Social History   Tobacco Use  . Smoking status: Former Smoker    Types: Cigarettes    Last attempt to quit: 03/19/2018    Years since quitting: 0.2  . Smokeless tobacco: Never Used  Substance Use Topics  . Alcohol use: Never    Frequency: Never  . Drug use: Never    Allergies: No Known Allergies  Medications Prior to Admission  Medication Sig Dispense Refill Last Dose  . albuterol (PROVENTIL HFA;VENTOLIN HFA) 108 (90 Base) MCG/ACT inhaler Inhale into the lungs.   Past Month at Unknown time  . aspirin EC 81 MG tablet Take 1 tablet (81 mg total) by mouth daily. Take after 12 weeks for prevention of preeclampsia later in pregnancy 300 tablet 2 07/02/2018 at Unknown time  . doxylamine, Sleep, (UNISOM) 25 MG tablet Take 25 mg by mouth at bedtime.   Past Month at Unknown time  . famotidine (PEPCID) 20 MG tablet Take 1 tablet (20 mg total) by mouth 2 (two) times daily. 60 tablet 3 07/02/2018 at Unknown time  . metoCLOPramide (REGLAN) 10 MG tablet Take 10 mg by  mouth 4 (four) times daily.   07/02/2018 at Unknown time  . Prenatal Vit-Fe Fumarate-FA (PRENATAL MULTIVITAMIN) TABS tablet Take 1 tablet by mouth daily at 12 noon.   07/02/2018 at Unknown time  . butalbital-acetaminophen-caffeine (FIORICET, ESGIC) 50-325-40 MG tablet Take 1 tablet by mouth every 6 (six) hours as needed for headache. (Patient not taking: Reported on 06/19/2018) 20 tablet 0 Not Taking  . ondansetron (ZOFRAN ODT) 4 MG disintegrating tablet Take 1 tablet (4 mg total) by mouth every 8 (eight) hours as needed for nausea or vomiting. 20 tablet 0     Review of Systems  Constitutional: Negative.   HENT: Negative.   Eyes: Negative.   Respiratory: Negative.   Cardiovascular: Negative.   Gastrointestinal: Positive for abdominal pain (lower right side), constipation (resolved yesterday), heartburn (better with famotidine), nausea and vomiting (once this morning).  Genitourinary: Negative.   Musculoskeletal: Negative.   Skin: Negative.   Neurological: Negative.   Endo/Heme/Allergies: Negative.   Psychiatric/Behavioral: Negative.    Physical Exam Blood pressure (!) 151/86, pulse (!) 106, temperature 98.6 F (37 C), resp. rate 18, height 5\' 3"  (1.6 m), weight 131.5 kg, last menstrual period 03/19/2018, SpO2 99 %. Physical Exam  Nursing note and vitals reviewed. Constitutional: She is oriented to person, place, and time. She appears  well-developed and well-nourished. No distress.  HENT:  Head: Normocephalic and atraumatic.  Neck: Normal range of motion.  Cardiovascular: Normal rate, regular rhythm and normal heart sounds.  Respiratory: Effort normal and breath sounds normal.  GI: Soft. Bowel sounds are normal. She exhibits no distension. There is no tenderness. There is no rebound and no guarding.  Genitourinary: Uterus normal. Vaginal discharge (Normal pregnancy discharge) found.  Musculoskeletal: Normal range of motion.  Neurological: She is alert and oriented to person, place, and  time.  Skin: Skin is warm and dry.  Psychiatric: She has a normal mood and affect. Her behavior is normal. Judgment and thought content normal.    MAU Course Procedures  MDM GC/wet prep Spec/CE exam  TVUS for CL Phenergan

## 2018-07-03 NOTE — MAU Provider Note (Signed)
History     CSN: [id number]  Arrival date and time: 07/03/18 [id number]   First Provider Initiated Contact with Patient 07/03/18 [phone]      Chief Complaint  Patient presents with  . Vaginal Bleeding   Autumn Duarte is a 22 y.o. G1P0 at 1546w1d with di/di twins who presents today with cramping and spotting.   Vaginal Bleeding  The patient's primary symptoms include pelvic pain and vaginal bleeding. This is a new problem. The current episode started today. The problem occurs intermittently. The problem has been unchanged. The problem affects the right side. She is pregnant. Pertinent negatives include no chills, fever, nausea or vomiting. The vaginal discharge was bloody. The vaginal bleeding is spotting. She has not been passing clots. She has not been passing tissue. The symptoms are aggravated by intercourse and activity. She has tried nothing for the symptoms. Sexual activity: reports intercourse last PM  Her menstrual history has been regular (LMP 03/19/18 ).    OB History    Gravida  1   Para      Term      Preterm      AB      Living  0     SAB      TAB      Ectopic      Multiple      Live Births              Past Medical History:  Diagnosis Date  . Asthma   . Bronchitis   . Eczema   . Obesity   . Seasonal allergies     Past Surgical History:  Procedure Laterality Date  . WISDOM TOOTH EXTRACTION      Family History  Problem Relation Age of Onset  . Arthritis Mother   . Hepatitis Mother   . Thyroid disease Mother   . Diabetes Father   . Heart disease Father   . Hypertension Father   . Breast cancer Maternal Aunt   . Breast cancer Maternal Grandmother     Social History   Tobacco Use  . Smoking status: Former Smoker    Types: Cigarettes    Last attempt to quit: 03/19/2018    Years since quitting: 0.2  . Smokeless tobacco: Never Used  Substance Use Topics  . Alcohol use: Never    Frequency: Never  . Drug use: Never    Allergies: No Known  Allergies  Medications Prior to Admission  Medication Sig Dispense Refill Last Dose  . albuterol (PROVENTIL HFA;VENTOLIN HFA) 108 (90 Base) MCG/ACT inhaler Inhale into the lungs.   Past Month at Unknown time  . aspirin EC 81 MG tablet Take 1 tablet (81 mg total) by mouth daily. Take after 12 weeks for prevention of preeclampsia later in pregnancy 300 tablet 2 07/02/2018 at Unknown time  . doxylamine, Sleep, (UNISOM) 25 MG tablet Take 25 mg by mouth at bedtime.   Past Month at Unknown time  . famotidine (PEPCID) 20 MG tablet Take 1 tablet (20 mg total) by mouth 2 (two) times daily. 60 tablet 3 07/02/2018 at Unknown time  . metoCLOPramide (REGLAN) 10 MG tablet Take 10 mg by mouth 4 (four) times daily.   07/02/2018 at Unknown time  . Prenatal Vit-Fe Fumarate-FA (PRENATAL MULTIVITAMIN) TABS tablet Take 1 tablet by mouth daily at 12 noon.   07/02/2018 at Unknown time  . butalbital-acetaminophen-caffeine (FIORICET, ESGIC) 50-325-40 MG tablet Take 1 tablet by mouth every 6 (six) hours as needed for  headache. (Patient not taking: Reported on 06/19/2018) 20 tablet 0 Not Taking  . ondansetron (ZOFRAN ODT) 4 MG disintegrating tablet Take 1 tablet (4 mg total) by mouth every 8 (eight) hours as needed for nausea or vomiting. 20 tablet 0     Review of Systems  Constitutional: Negative for chills and fever.  Gastrointestinal: Negative for nausea and vomiting.  Genitourinary: Positive for pelvic pain and vaginal bleeding.   Physical Exam   Blood pressure (!) 151/86, pulse (!) 106, temperature 98.6 F (37 C), resp. rate 18, height 5\' 3"  (1.6 m), weight 131.5 kg, last menstrual period 03/19/2018, SpO2 99 %.  Physical Exam  Nursing note and vitals reviewed. Constitutional: She is oriented to person, place, and time. She appears well-developed and well-nourished. No distress.  HENT:  Head: Normocephalic.  Cardiovascular: Normal rate.  Respiratory: Effort normal.  GI: Soft. There is no tenderness. There is no  rebound.  Genitourinary:  Genitourinary Comments:  External: no lesion Vagina: small amount of blood seen  Cervix: pink, smooth, soft Uterus: AGA + FHT 158 and 148   Neurological: She is alert and oriented to person, place, and time.  Skin: Skin is warm and dry.  Psychiatric: She has a normal mood and affect.    Cervical length 3.22 cm  Results for orders placed or performed during the hospital encounter of 07/03/18 (from the past 24 hour(s))  Urinalysis, Routine w reflex microscopic     Status: Abnormal   Collection Time: 07/03/18  8:27 AM  Result Value Ref Range   Color, Urine YELLOW YELLOW   APPearance HAZY (A) CLEAR   Specific Gravity, Urine 1.023 1.005 - 1.030   pH 6.0 5.0 - 8.0   Glucose, UA NEGATIVE NEGATIVE mg/dL   Hgb urine dipstick MODERATE (A) NEGATIVE   Bilirubin Urine NEGATIVE NEGATIVE   Ketones, ur NEGATIVE NEGATIVE mg/dL   Protein, ur NEGATIVE NEGATIVE mg/dL   Nitrite NEGATIVE NEGATIVE   Leukocytes, UA MODERATE (A) NEGATIVE   RBC / HPF 21-50 0 - 5 RBC/hpf   WBC, UA 11-20 0 - 5 WBC/hpf   Bacteria, UA RARE (A) NONE SEEN   Squamous Epithelial / LPF 6-10 0 - 5   Mucus PRESENT    Sperm, UA PRESENT   Wet prep, genital     Status: Abnormal   Collection Time: 07/03/18  9:16 AM  Result Value Ref Range   Yeast Wet Prep HPF POC NONE SEEN NONE SEEN   Trich, Wet Prep NONE SEEN NONE SEEN   Clue Cells Wet Prep HPF POC NONE SEEN NONE SEEN   WBC, Wet Prep HPF POC MANY (A) NONE SEEN   Sperm PRESENT     MAU Course  Procedures  MDM   Assessment and Plan   1. Pain of round ligament affecting pregnancy, antepartum   2. Supervision of high risk pregnancy, antepartum   3. Obesity in pregnancy   4. Encounter for antenatal screening for cervical length   5. [redacted] weeks gestation of pregnancy   6. Dichorionic diamniotic twin pregnancy in second trimester    DC home Comfort measures reviewed  2nd Trimester precautions  PTL precautions  Fetal kick counts RX: phenergan  PRN #[id number]  Return to MAU as needed FU with OB as planned  Follow-up Information    [facility] Follow up.   Specialty:  Obstetrics and Gynecology Contact information: [address] [place] [address] [phone]  Thressa Sheller 07/03/2018, 11:43 AM

## 2018-07-03 NOTE — MAU Note (Signed)
Pt reports cramping on right side this am, vomited x one . States she went to the restroom and urinated and when she wiped there was blood on the tissue.

## 2018-07-03 NOTE — Discharge Instructions (Signed)

## 2018-07-04 LAB — GC/CHLAMYDIA PROBE AMP (~~LOC~~) NOT AT ARMC
CHLAMYDIA, DNA PROBE: NEGATIVE
Neisseria Gonorrhea: NEGATIVE

## 2018-07-09 ENCOUNTER — Ambulatory Visit (INDEPENDENT_AMBULATORY_CARE_PROVIDER_SITE_OTHER): Payer: Medicaid Other | Admitting: Obstetrics & Gynecology

## 2018-07-09 VITALS — BP 120/81 | HR 87 | Wt 291.6 lb

## 2018-07-09 DIAGNOSIS — O0992 Supervision of high risk pregnancy, unspecified, second trimester: Secondary | ICD-10-CM

## 2018-07-09 DIAGNOSIS — O30002 Twin pregnancy, unspecified number of placenta and unspecified number of amniotic sacs, second trimester: Secondary | ICD-10-CM

## 2018-07-09 DIAGNOSIS — Z6841 Body Mass Index (BMI) 40.0 and over, adult: Principal | ICD-10-CM

## 2018-07-09 DIAGNOSIS — O30009 Twin pregnancy, unspecified number of placenta and unspecified number of amniotic sacs, unspecified trimester: Secondary | ICD-10-CM

## 2018-07-09 DIAGNOSIS — O0990 Supervision of high risk pregnancy, unspecified, unspecified trimester: Secondary | ICD-10-CM

## 2018-07-09 NOTE — Patient Instructions (Signed)
AREA PEDIATRIC/FAMILY PRACTICE PHYSICIANS  [facility] [address], [address] [place], [place]  [address] Phone - [phone]   Fax - [phone]  [facility] [address] [place], [place] [address] Phone - [phone]   Fax - [phone]  [facility] [address] [place], [place]  [address] Phone - [phone]   Fax - [phone]  [facility] [address] [place], [place]  [address] Phone - [phone]   Fax - [phone]  [facility] [address] [place], [place]  [address] Phone - [phone]   Fax - [phone]  [facility] [address] [place], [place]  [address] Phone - [phone]   Fax - [phone]  [facility] [address] [place], [place]  [address] Phone - [phone]   Fax - [phone]  [facility] [address], [address] [place], [place]  [address] Phone - [phone]   Fax - [phone]  [facility] [address] [place], [place]  [address] Phone - [phone]   Fax - [phone] [facility] [address] [place], [place]  [address] Phone - [phone]   Fax - [phone]  [facility] [address] [place], [place]  [address] Phone - [phone]   Fax - [phone]  [facility] [address] [place], [place]  [address] Phone - [phone]   Fax - [phone]  [facility] [address] [place], [place]  [address] Phone - [phone]   Fax - [phone]  [facility] GOSR[facility] [address] [place], [place]  [address] Phone - [phone]  [facility] [address] [place], [place]  [address] Phone - [phone]   Fax - [phone]  [facility] [address] [place], [place]  [address] Phone - [phone]   Fax - [phone]  [facility] [address] [place], [place]  [address] Phone - [phone]   Fax - [phone]  [facility] [address] [place], [place]  [address] Phone - [phone]   Fax - [phone]   [facility] [address] [place], [place]  [address] Phone - [phone]   Fax - [phone]  [facility] [address] [place], [place]  [address] Phone - [phone]   Fax - [phone]  DAV[facility] RUBIN [address] [place], [place]  [address] Phone - [phone]   Fax - [phone]  [facility] [address] [place], [place]  [address] Phone - [phone]   Fax - [phone]  [facility] [address] , [place]  [address] Phone - [phone]   Fax - [phone] [facility] [address] [place], [place]  [address] Phone - [phone]   Fax - [phone]  [facility] [address] [place], [place]  [address] Phone - [phone]   Fax - [phone]  [facility] [address] [place], [place]  [address] Phone - [phone]   Fax - [phone]  [facility] Charlene Flemming MD [address]  [place] [address] Phone [phone]  Fax [phone] Places to have your son circumcised:    [facility] [phone] $[phone] while you are   in hospital  [facility] [phone] $[phone] by 4 wks  [facility] [phone] $[phone] by 2 wks  [facility] [phone] $[phone] by 7 days [facility] [phone] $[phone] by 4 wks  These prices sometimes change but are roughly what you can expect to pay. Please  call and confirm pricing.   Circumcision is considered an elective/non-medically necessary procedure. There are many reasons parents decide to have their sons circumsized. During the first year of life circumcised males have a reduced risk of urinary tract infections but after this year the rates between circumcised males and uncircumcised males are the same.  It is safe to have your son circumcised outside of the hospital and the places above perform them regularly.   Deciding about Circumcision in Baby Boys  (Up-to-date The Basics)  What is circumcision?  Circumcision is a surgery that removes the skin that covers the tip of the penis, called the "foreskin" Circumcision is usually done when a boy is between 1 and 10 days old. In the [place], circumcision is common. In some other countries, fewer boys are circumcised. Circumcision is a common tradition in some religions.  Should I have my baby boy circumcised?  There is no easy answer. Circumcision has some benefits. But it also has risks. After talking with your doctor, you will have to decide for yourself what is right for your family.  What are the benefits of circumcision?  Circumcised boys seem to have slightly lower rates of: ?Urinary tract infections ?Swelling of the opening at the tip of the penis Circumcised men seem to have slightly lower rates of: ?Urinary tract infections ?Swelling of the opening at the tip of the penis ?Penis cancer ?HIV and other infections that you catch during sex ?Cervical cancer in the women they have sex with Even so, in the [place], the risks of these problems are small - even in boys and men who have not been circumcised. Plus, boys and men who are not circumcised can reduce these extra risks by: ?Cleaning their penis well ?Using condoms during sex  What are the risks of circumcision?  Risks include: ?Bleeding or infection from the surgery ?Damage to or amputation of the  penis ?A chance that the doctor will cut off too much or not enough of the foreskin ?A chance that sex won't feel as good later in life Only about 1 out of every 200 circumcisions leads to problems. There is also a chance that your health insurance won't pay for circumcision.  How is circumcision done in baby boys?  First, the baby gets medicine for pain relief. This might be a cream on the skin or a shot into the base of the penis. Next, the doctor cleans the baby's penis well. Then he or she uses special tools to cut off the foreskin. Finally, the doctor wraps a bandage (called gauze) around the baby's penis. If you have your baby circumcised, his doctor or nurse will give you instructions on how to care for him after the surgery. It is important that you follow those instructions carefully.  

## 2018-07-09 NOTE — Progress Notes (Signed)
   PRENATAL VISIT NOTE  Subjective:  Autumn Duarte is a 22 y.o. G1P0 at 5224w0d being seen today for ongoing prenatal care.  She is currently monitored for the following issues for this high-risk pregnancy and has ECZEMA; PROBLEMS WITH HEARING; Supervision of high risk pregnancy, antepartum; Twin pregnancy, antepartum; Morbid obesity with BMI of 50.0-59.9, adult (HCC); and Obesity in pregnancy on their problem list.  Patient reports headaches for about "always", but worse during her pregnancy.  Contractions: Not present. Vag. Bleeding: Scant.  Movement: Present. Denies leaking of fluid.   The following portions of the patient's history were reviewed and updated as appropriate: allergies, current medications, past family history, past medical history, past social history, past surgical history and problem list. Problem list updated.  Objective:   Vitals:   07/09/18 0944  BP: 120/81  Pulse: 87  Weight: 291 lb 9.6 oz (132.3 kg)    Fetal Status: Fetal Heart Rate (bpm): 142/159   Movement: Present     General:  Alert, oriented and cooperative. Patient is in no acute distress.  Skin: Skin is warm and dry. No rash noted.   Cardiovascular: Normal heart rate noted  Respiratory: Normal respiratory effort, no problems with respiration noted  Abdomen: Soft, gravid, appropriate for gestational age.  Pain/Pressure: Present     Pelvic: Cervical exam deferred        Extremities: Normal range of motion.  Edema: None  Mental Status: Normal mood and affect. Normal behavior. Normal judgment and thought content.   Assessment and Plan:  Pregnancy: G1P0 at 4524w0d  1. Morbid obesity with BMI of 50.0-59.9, adult (HCC) - discussed excess weight - Referral to Nutrition and Diabetes Services  2. Supervision of high risk pregnancy, antepartum  - AFP, Serum, Open Spina Bifida  3. Twin pregnancy, antepartum, unspecified multiple gestation type - has MFM anatomy on 07-31-18.  Preterm labor symptoms and  general obstetric precautions including but not limited to vaginal bleeding, contractions, leaking of fluid and fetal movement were reviewed in detail with the patient. Please refer to After Visit Summary for other counseling recommendations.  Return in about 4 weeks (around 08/06/2018).  Future Appointments  Date Time Provider Department Center  07/31/2018  9:00 AM [facility] US 3 [facility]    Allie BossierMyra C Milanie Rosenfield, MD

## 2018-07-11 LAB — AFP, SERUM, OPEN SPINA BIFIDA
AFP MoM: 2.05
AFP Value: 51 ng/mL
GEST. AGE ON COLLECTION DATE: 16 wk
Maternal Age At EDD: 21.3 yr
OSBR Risk 1 IN: 1443
Test Results:: NEGATIVE
Weight: 291 [lb_av]

## 2018-07-12 ENCOUNTER — Other Ambulatory Visit: Payer: Self-pay

## 2018-07-12 ENCOUNTER — Encounter: Payer: Self-pay | Admitting: *Deleted

## 2018-07-12 DIAGNOSIS — G4452 New daily persistent headache (NDPH): Secondary | ICD-10-CM

## 2018-07-12 MED ORDER — BUTALBITAL-APAP-CAFFEINE 50-325-40 MG PO TABS: 1.0000 | ORAL_TABLET | Freq: Four times a day (QID) | ORAL | 0 refills | Status: DC | PRN

## 2018-07-12 NOTE — Telephone Encounter (Signed)
Refilled pt Rx, Called and LVM to pick up paper Rx at our front desk.

## 2018-07-23 ENCOUNTER — Other Ambulatory Visit: Payer: Self-pay

## 2018-07-23 ENCOUNTER — Encounter (HOSPITAL_COMMUNITY): Payer: Self-pay | Admitting: Emergency Medicine

## 2018-07-23 ENCOUNTER — Inpatient Hospital Stay (HOSPITAL_COMMUNITY)
Admission: AD | Admit: 2018-07-23 | Discharge: 2018-07-23 | Disposition: A | Discharge status: Home / Self Care | Payer: Medicaid Other | Source: Ambulatory Visit | Attending: Obstetrics and Gynecology | Admitting: Obstetrics and Gynecology

## 2018-07-23 ENCOUNTER — Inpatient Hospital Stay (HOSPITAL_COMMUNITY): Payer: Medicaid Other

## 2018-07-23 DIAGNOSIS — O9989 Other specified diseases and conditions complicating pregnancy, childbirth and the puerperium: Secondary | ICD-10-CM | POA: Diagnosis not present

## 2018-07-23 DIAGNOSIS — J069 Acute upper respiratory infection, unspecified: Secondary | ICD-10-CM

## 2018-07-23 DIAGNOSIS — Z3A18 18 weeks gestation of pregnancy: Secondary | ICD-10-CM | POA: Diagnosis not present

## 2018-07-23 DIAGNOSIS — R319 Hematuria, unspecified: Secondary | ICD-10-CM | POA: Diagnosis not present

## 2018-07-23 DIAGNOSIS — J029 Acute pharyngitis, unspecified: Secondary | ICD-10-CM | POA: Diagnosis not present

## 2018-07-23 DIAGNOSIS — O99512 Diseases of the respiratory system complicating pregnancy, second trimester: Secondary | ICD-10-CM | POA: Insufficient documentation

## 2018-07-23 DIAGNOSIS — M549 Dorsalgia, unspecified: Secondary | ICD-10-CM | POA: Insufficient documentation

## 2018-07-23 DIAGNOSIS — J45909 Unspecified asthma, uncomplicated: Secondary | ICD-10-CM | POA: Insufficient documentation

## 2018-07-23 DIAGNOSIS — O99891 Other specified diseases and conditions complicating pregnancy: Secondary | ICD-10-CM

## 2018-07-23 DIAGNOSIS — Z87891 Personal history of nicotine dependence: Secondary | ICD-10-CM | POA: Diagnosis not present

## 2018-07-23 DIAGNOSIS — Z79899 Other long term (current) drug therapy: Secondary | ICD-10-CM | POA: Insufficient documentation

## 2018-07-23 DIAGNOSIS — Z7982 Long term (current) use of aspirin: Secondary | ICD-10-CM | POA: Insufficient documentation

## 2018-07-23 DIAGNOSIS — O99212 Obesity complicating pregnancy, second trimester: Secondary | ICD-10-CM | POA: Diagnosis not present

## 2018-07-23 LAB — URINALYSIS, ROUTINE W REFLEX MICROSCOPIC
Bilirubin Urine: NEGATIVE
Glucose, UA: NEGATIVE mg/dL
Ketones, ur: NEGATIVE mg/dL
Nitrite: NEGATIVE
Protein, ur: NEGATIVE mg/dL
Specific Gravity, Urine: 1.024 (ref 1.005–1.030)
pH: 7 (ref 5.0–8.0)

## 2018-07-23 LAB — INFLUENZA PANEL BY PCR (TYPE A & B)
Influenza A By PCR: NEGATIVE
Influenza B By PCR: NEGATIVE

## 2018-07-23 LAB — GROUP A STREP BY PCR: Group A Strep by PCR: NOT DETECTED

## 2018-07-23 MED ORDER — PSEUDOEPHEDRINE HCL 30 MG PO TABS: 30.0000 mg | ORAL_TABLET | Freq: Four times a day (QID) | ORAL | 0 refills | Status: DC | PRN

## 2018-07-23 MED ORDER — CYCLOBENZAPRINE HCL 10 MG PO TABS
10.0000 mg | ORAL_TABLET | Freq: Three times a day (TID) | ORAL | Status: DC | PRN
  Administered 2018-07-23: 10 mg via ORAL
  Filled 2018-07-23: qty 1

## 2018-07-23 MED ORDER — CYCLOBENZAPRINE HCL 10 MG PO TABS: 10.0000 mg | ORAL_TABLET | Freq: Three times a day (TID) | ORAL | 0 refills | Status: DC | PRN

## 2018-07-23 MED ORDER — GUAIFENESIN ER 600 MG PO TB12: 1200.0000 mg | ORAL_TABLET | Freq: Two times a day (BID) | ORAL | 0 refills | Status: DC

## 2018-07-23 NOTE — MAU Note (Signed)
Pt reports right sided back pain x2 days. Pt denies UTI s/s. States last night she woke up and her throat was irritated. States her throat has continued to hurt and has some nasal congestion. States her chest burns every time she coughs. States she took Tylenol at 11am but it did not help so she didn't take anymore.

## 2018-07-23 NOTE — MAU Provider Note (Signed)
History     CSN: [id number]  Arrival date and time: 07/23/18 1948  Chief Complaint  Patient presents with  . Back Pain  . Sore Throat   G1 @18 .0 wks with didi twins here with LBP and cough. Reports LBP x2 days, only on right. Denies injury, pushing, pulling, or lifting. Denies urinary sx. No VB or abd pain. Cough started last night. Associated sx are sore throat and nasal congestion. No sick contacts. Denies fevers but feels chills. Cough is productive with green phlegm. Her chest burns when she coughs. No body aches.    OB History    Gravida  1   Para      Term      Preterm      AB      Living  0     SAB      TAB      Ectopic      Multiple      Live Births              Past Medical History:  Diagnosis Date  . Asthma   . Bronchitis   . Eczema   . Obesity   . Seasonal allergies     Past Surgical History:  Procedure Laterality Date  . WISDOM TOOTH EXTRACTION      Family History  Problem Relation Age of Onset  . Arthritis Mother   . Hepatitis Mother   . Thyroid disease Mother   . Diabetes Father   . Heart disease Father   . Hypertension Father   . Breast cancer Maternal Aunt   . Breast cancer Maternal Grandmother     Social History   Tobacco Use  . Smoking status: Former Smoker    Types: Cigarettes    Last attempt to quit: 03/19/2018    Years since quitting: 0.3  . Smokeless tobacco: Never Used  Substance Use Topics  . Alcohol use: Never    Frequency: Never  . Drug use: Never    Allergies: No Known Allergies  Medications Prior to Admission  Medication Sig Dispense Refill Last Dose  . albuterol (PROVENTIL HFA;VENTOLIN HFA) 108 (90 Base) MCG/ACT inhaler Inhale into the lungs.   Taking  . aspirin EC 81 MG tablet Take 1 tablet (81 mg total) by mouth daily. Take after 12 weeks for prevention of preeclampsia later in pregnancy 300 tablet 2 Taking  . butalbital-acetaminophen-caffeine (FIORICET, ESGIC) 50-325-40 MG tablet Take 1-2 tablets  by mouth every 6 (six) hours as needed for headache. 20 tablet 0   . doxylamine, Sleep, (UNISOM) 25 MG tablet Take 25 mg by mouth at bedtime.   Taking  . famotidine (PEPCID) 20 MG tablet Take 1 tablet (20 mg total) by mouth 2 (two) times daily. 60 tablet 3 07/02/2018 at Unknown time  . metoCLOPramide (REGLAN) 10 MG tablet Take 10 mg by mouth 4 (four) times daily.   Taking  . Prenatal Vit-Fe Fumarate-FA (PRENATAL MULTIVITAMIN) TABS tablet Take 1 tablet by mouth daily at 12 noon.   Taking    Review of Systems  Constitutional: Positive for chills. Negative for fever.  HENT: Positive for congestion and sore throat. Negative for ear pain.   Respiratory: Positive for cough. Negative for shortness of breath.   Gastrointestinal: Negative for abdominal pain.  Genitourinary: Negative for dysuria, hematuria, urgency and vaginal bleeding.  Musculoskeletal: Positive for back pain. Negative for myalgias.   Physical Exam   Blood pressure 137/75, pulse (!) 116, temperature 98.8 F (37.1 C),  temperature source Oral, resp. rate 18, height 5\' 3"  (1.6 m), weight 132.9 kg, last menstrual period 03/19/2018, SpO2 99 %.  Physical Exam  Constitutional: She is oriented to person, place, and time. She appears well-developed and well-nourished. No distress.  HENT:  Head: Normocephalic and atraumatic.  Right Ear: Hearing, tympanic membrane, external ear and ear canal normal.  Left Ear: Hearing, tympanic membrane, external ear and ear canal normal.  Nose: Right sinus exhibits no maxillary sinus tenderness and no frontal sinus tenderness. Left sinus exhibits no maxillary sinus tenderness and no frontal sinus tenderness.  Mouth/Throat: Uvula is midline, oropharynx is clear and moist and mucous membranes are normal.  Neck: Normal range of motion.  Cardiovascular: Normal rate, regular rhythm and normal heart sounds.  Mild tachy  Respiratory: Effort normal and breath sounds normal. No respiratory distress. She has no  wheezes. She has no rales.  GI: Soft. She exhibits no distension. There is no abdominal tenderness. There is no CVA tenderness.  Musculoskeletal: Normal range of motion.     Cervical back: Normal.     Thoracic back: Normal.     Lumbar back: She exhibits tenderness (right). She exhibits no deformity.  Neurological: She is alert and oriented to person, place, and time.  Skin: Skin is warm and dry.  Psychiatric: She has a normal mood and affect.  FHT A- 164 FHT-B 149  Results for orders placed or performed during the hospital encounter of 07/23/18 (from the past 24 hour(s))  Urinalysis, Routine w reflex microscopic     Status: Abnormal   Collection Time: 07/23/18  8:06 PM  Result Value Ref Range   Color, Urine YELLOW YELLOW   APPearance HAZY (A) CLEAR   Specific Gravity, Urine 1.024 1.005 - 1.030   pH 7.0 5.0 - 8.0   Glucose, UA NEGATIVE NEGATIVE mg/dL   Hgb urine dipstick SMALL (A) NEGATIVE   Bilirubin Urine NEGATIVE NEGATIVE   Ketones, ur NEGATIVE NEGATIVE mg/dL   Protein, ur NEGATIVE NEGATIVE mg/dL   Nitrite NEGATIVE NEGATIVE   Leukocytes, UA TRACE (A) NEGATIVE   RBC / HPF 6-10 0 - 5 RBC/hpf   WBC, UA 0-5 0 - 5 WBC/hpf   Bacteria, UA RARE (A) NONE SEEN   Squamous Epithelial / LPF 11-20 0 - 5   Mucus PRESENT    Amorphous Crystal PRESENT   Influenza panel by PCR (type A & B)     Status: None   Collection Time: 07/23/18  8:28 PM  Result Value Ref Range   Influenza A By PCR NEGATIVE NEGATIVE   Influenza B By PCR NEGATIVE NEGATIVE   US Renal  Result Date: 07/23/2018 CLINICAL DATA:  Initial evaluation for acute right-sided flank pain, hematuria. Currently pregnant. EXAM: RENAL / URINARY TRACT ULTRASOUND COMPLETE COMPARISON:  None. FINDINGS: Right Kidney: Renal measurements: 10.7 x 4.5 x 5.6 cm = volume: 139.5 mL . Echogenicity within normal limits. No mass or hydronephrosis visualized. No shadowing echogenic foci to suggest nephrolithiasis. Left Kidney: Renal measurements: 11.0 x  5.9 x 5.4 cm = volume: 183.1 mL. Echogenicity within normal limits. No mass or hydronephrosis visualized. No shadowing echogenic foci to suggest nephrolithiasis. Bladder: Not well visualized. IMPRESSION: Negative renal ultrasound. No hydronephrosis or sonographic evidence for nephrolithiasis. Electronically Signed   By: Rise Mu M.D.   On: 07/23/2018 21:59   MAU Course  Procedures Flexeril Heating pad  MDM Labs ordered and reviewed. Hgb on UA will obtain renal US. No evidence of stones or pyelo. Back pain  resolved after Flexeril, likely MSK. No evidence of flu, strep pending. Discussed supportive measures for upper resp virus. Stable for discharge home.   Assessment and Plan   1. [redacted] weeks gestation of pregnancy   2. Hematuria   3. Back pain affecting pregnancy   4. Upper respiratory virus   5. Musculoskeletal back pain    Discharge home Follow up in OB office as scheduled Rx Mucinex Rx Sudafed Rx Flexeril  Allergies as of 07/23/2018   No Known Allergies     Medication List    TAKE these medications   albuterol 108 (90 Base) MCG/ACT inhaler Commonly known as:  PROVENTIL HFA;VENTOLIN HFA Inhale into the lungs.   aspirin EC 81 MG tablet Take 1 tablet (81 mg total) by mouth daily. Take after 12 weeks for prevention of preeclampsia later in pregnancy   butalbital-acetaminophen-caffeine 50-325-40 MG tablet Commonly known as:  FIORICET, ESGIC Take 1-2 tablets by mouth every 6 (six) hours as needed for headache.   cyclobenzaprine 10 MG tablet Commonly known as:  FLEXERIL Take 1 tablet (10 mg total) by mouth 3 (three) times daily as needed for muscle spasms.   doxylamine (Sleep) 25 MG tablet Commonly known as:  UNISOM Take 25 mg by mouth at bedtime.   famotidine 20 MG tablet Commonly known as:  PEPCID Take 1 tablet (20 mg total) by mouth 2 (two) times daily.   guaiFENesin 600 MG 12 hr tablet Commonly known as:  MUCINEX Take 2 tablets (1,200 mg total) by  mouth 2 (two) times daily.   metoCLOPramide 10 MG tablet Commonly known as:  REGLAN Take 10 mg by mouth 4 (four) times daily.   prenatal multivitamin Tabs tablet Take 1 tablet by mouth daily at 12 noon.   pseudoephedrine 30 MG tablet Commonly known as:  SUDAFED Take 1 tablet (30 mg total) by mouth every 6 (six) hours as needed for congestion.      Donette LarryMelanie Marcela Alatorre, CNM 07/23/2018, 10:13 PM

## 2018-07-23 NOTE — Discharge Instructions (Signed)
Acute Back Pain, Adult Acute back pain is sudden and usually short-lived. It is often caused by an injury to the muscles and tissues in the back. The injury may result from:  A muscle or ligament getting overstretched or torn (strained). Ligaments are tissues that connect bones to each other. Lifting something improperly can cause a back strain.  Wear and tear (degeneration) of the spinal disks. Spinal disks are circular tissue that provides cushioning between the bones of the spine (vertebrae).  Twisting motions, such as while playing sports or doing yard work.  A hit to the back.  Arthritis. You may have a physical exam, lab tests, and imaging tests to find the cause of your pain. Acute back pain usually goes away with rest and home care. Follow these instructions at home: Managing pain, stiffness, and swelling  Take over-the-counter and prescription medicines only as told by your health care provider.  Your health care provider may recommend applying ice during the first 24-48 hours after your pain starts. To do this: ? Put ice in a plastic bag. ? Place a towel between your skin and the bag. ? Leave the ice on for 20 minutes, 2-3 times a day.  If directed, apply heat to the affected area as often as told by your health care provider. Use the heat source that your health care provider recommends, such as a moist heat pack or a heating pad. ? Place a towel between your skin and the heat source. ? Leave the heat on for 20-30 minutes. ? Remove the heat if your skin turns bright red. This is especially important if you are unable to feel pain, heat, or cold. You have a greater risk of getting burned. Activity   Do not stay in bed. Staying in bed for more than 1-2 days can delay your recovery.  Sit up and stand up straight. Avoid leaning forward when you sit, or hunching over when you stand. ? If you work at a desk, sit close to it so you do not need to lean over. Keep your chin tucked  in. Keep your neck drawn back, and keep your elbows bent at a right angle. Your arms should look like the letter "L." ? Sit high and close to the steering wheel when you drive. Add lower back (lumbar) support to your car seat, if needed.  Take short walks on even surfaces as soon as you are able. Try to increase the length of time you walk each day.  Do not sit, drive, or stand in one place for more than 30 minutes at a time. Sitting or standing for long periods of time can put stress on your back.  Do not drive or use heavy machinery while taking prescription pain medicine.  Use proper lifting techniques. When you bend and lift, use positions that put less stress on your back: ? [place]shaw your knees. ? Keep the load close to your body. ? Avoid twisting.  Exercise regularly as told by your health care provider. Exercising helps your back heal faster and helps prevent back injuries by keeping muscles strong and flexible.  Work with a physical therapist to make a safe exercise program, as recommended by your health care provider. Do any exercises as told by your physical therapist. Lifestyle  Maintain a healthy weight. Extra weight puts stress on your back and makes it difficult to have good posture.  Avoid activities or situations that make you feel anxious or stressed. Stress and anxiety increase muscle  tension and can make back pain worse. Learn ways to manage anxiety and stress, such as through exercise. General instructions  Sleep on a firm mattress in a comfortable position. Try lying on your side with your knees slightly bent. If you lie on your back, put a pillow under your knees.  Follow your treatment plan as told by your health care provider. This may include: ? Cognitive or behavioral therapy. ? Acupuncture or massage therapy. ? Meditation or yoga. Contact a health care provider if:  You have pain that is not relieved with rest or medicine.  You have increasing pain going down  into your legs or buttocks.  Your pain does not improve after 2 weeks.  You have pain at night.  You lose weight without trying.  You have a fever or chills. Get help right away if:  You develop new bowel or bladder control problems.  You have unusual weakness or numbness in your arms or legs.  You develop nausea or vomiting.  You develop abdominal pain.  You feel faint. Summary  Acute back pain is sudden and usually short-lived.  Use proper lifting techniques. When you bend and lift, use positions that put less stress on your back.  Take over-the-counter and prescription medicines and apply heat or ice as directed by your health care provider. This information is not intended to replace advice given to you by your health care provider. Make sure you discuss any questions you have with your health care provider. Document Released: 07/18/2005 Document Revised: 02/22/2018 Document Reviewed: 03/01/2017 Elsevier Interactive Patient Education  2019 [employer]  Upper Respiratory Infection, Adult An upper respiratory infection (URI) affects the nose, throat, and upper air passages. URIs are caused by germs (viruses). The most common type of URI is often called "the common cold." Medicines cannot cure URIs, but you can do things at home to relieve your symptoms. URIs usually get better within 7-10 days. Follow these instructions at home: Activity  Rest as needed.  If you have a fever, stay home from work or school until your fever is gone, or until your doctor says you may return to work or school. ? You should stay home until you cannot spread the infection anymore (you are not contagious). ? Your doctor may have you wear a face mask so you have less risk of spreading the infection. Relieving symptoms  Gargle with a salt-water mixture 3-4 times a day or as needed. To make a salt-water mixture, completely dissolve -1 tsp of salt in 1 cup of warm water.  Use a cool-mist  humidifier to add moisture to the air. This can help you breathe more easily. Eating and drinking   Drink enough fluid to keep your pee (urine) pale yellow.  Eat soups and other clear broths. General instructions   Take over-the-counter and prescription medicines only as told by your doctor. These include cold medicines, fever reducers, and cough suppressants.  Do not use any products that contain nicotine or tobacco. These include cigarettes and e-cigarettes. If you need help quitting, ask your doctor.  Avoid being where people are smoking (avoid secondhand smoke).  Make sure you get regular shots and get the flu shot every year.  Keep all follow-up visits as told by your doctor. This is important. How to avoid spreading infection to others   Wash your hands often with soap and water. If you do not have soap and water, use hand sanitizer.  Avoid touching your mouth, face, eyes, or  nose.  Cough or sneeze into a tissue or your sleeve or elbow. Do not cough or sneeze into your hand or into the air. Contact a doctor if:  You are getting worse, not better.  You have any of these: ? A fever. ? Chills. ? Brown or red mucus in your nose. ? Yellow or brown fluid (discharge)coming from your nose. ? Pain in your face, especially when you bend forward. ? Swollen neck glands. ? Pain with swallowing. ? White areas in the back of your throat. Get help right away if:  You have shortness of breath that gets worse.  You have very bad or constant: ? Headache. ? Ear pain. ? Pain in your forehead, behind your eyes, and over your cheekbones (sinus pain). ? Chest pain.  You have long-lasting (chronic) lung disease along with any of these: ? Wheezing. ? Long-lasting cough. ? Coughing up blood. ? A change in your usual mucus.  You have a stiff neck.  You have changes in your: ? Vision. ? Hearing. ? Thinking. ? Mood. Summary  An upper respiratory infection (URI) is caused by a  germ called a virus. The most common type of URI is often called "the common cold."  URIs usually get better within 7-10 days.  Take over-the-counter and prescription medicines only as told by your doctor. This information is not intended to replace advice given to you by your health care provider. Make sure you discuss any questions you have with your health care provider. Document Released: 01/04/2008 Document Revised: 03/10/2017 Document Reviewed: 03/10/2017 Elsevier Interactive Patient Education  2019 [employer]

## 2018-07-30 ENCOUNTER — Telehealth: Payer: Self-pay | Admitting: *Deleted

## 2018-07-30 NOTE — Telephone Encounter (Signed)
Pt left VM message while office was closed on 12/27. She stated that she had received a call from [place] regarding a prescription refill and requested the Rx to be sent to [facility] on [address] I returned pt's call and informed her that the Rx was printed on paper and she will need to pick up and take to her pharmacy. Pt voiced understanding and stated she will obtain the Rx tomorrow when she is here for scheduled appointment.

## 2018-07-31 ENCOUNTER — Encounter (HOSPITAL_COMMUNITY): Payer: Self-pay

## 2018-07-31 ENCOUNTER — Ambulatory Visit (HOSPITAL_COMMUNITY)
Admission: RE | Admit: 2018-07-31 | Discharge: 2018-07-31 | Disposition: A | Discharge status: Home / Self Care | Payer: Medicaid Other | Source: Ambulatory Visit | Attending: Family Medicine | Admitting: Family Medicine

## 2018-07-31 ENCOUNTER — Other Ambulatory Visit (HOSPITAL_COMMUNITY): Payer: Self-pay | Admitting: *Deleted

## 2018-07-31 DIAGNOSIS — Z363 Encounter for antenatal screening for malformations: Secondary | ICD-10-CM | POA: Diagnosis not present

## 2018-07-31 DIAGNOSIS — O0990 Supervision of high risk pregnancy, unspecified, unspecified trimester: Secondary | ICD-10-CM | POA: Insufficient documentation

## 2018-07-31 DIAGNOSIS — O99210 Obesity complicating pregnancy, unspecified trimester: Secondary | ICD-10-CM | POA: Diagnosis present

## 2018-07-31 DIAGNOSIS — O30042 Twin pregnancy, dichorionic/diamniotic, second trimester: Secondary | ICD-10-CM

## 2018-07-31 DIAGNOSIS — O30009 Twin pregnancy, unspecified number of placenta and unspecified number of amniotic sacs, unspecified trimester: Secondary | ICD-10-CM

## 2018-07-31 DIAGNOSIS — O30049 Twin pregnancy, dichorionic/diamniotic, unspecified trimester: Secondary | ICD-10-CM

## 2018-07-31 DIAGNOSIS — Z3A19 19 weeks gestation of pregnancy: Secondary | ICD-10-CM | POA: Diagnosis not present

## 2018-07-31 DIAGNOSIS — O99212 Obesity complicating pregnancy, second trimester: Secondary | ICD-10-CM | POA: Diagnosis not present

## 2018-08-01 NOTE — L&D Delivery Note (Signed)
OB/GYN Faculty Practice Delivery Note  Autumn Duarte is a 23 y.o. G1P0 s/p SVD at [redacted]w[redacted]d. She was admitted for induction of labor for gestational hypertension and also has di-di twins.   ROM: 24h 48m with clear fluid GBS Status: negative Maximum Maternal Temperature: Temp (24hrs), Avg:98.1 F (36.7 C), Min:97.9 F (36.6 C), Max:98.3 F (36.8 C)  Labor Progress: . Favorable cervix on admission . Induction started with pitocin . Epidural placement . AROM amniotic sac Twin A  Delivery Date/Time: Twin A 12/04/18 - 0152  Twin B 12/04/18 - 0212 Delivery: Called to room and patient was complete. Stayed in room with patient while pushing for about 45 minutes. Head of Twin A delivered, no nuchal cord present. Shoulder and body delivered in usual fashion. Infant with spontaneous cry, placed on mother's abdomen, dried and stimulated. Cord clamped x 2 after 1-minute delay, and cut by father of baby. Cord blood drawn.  SVE confirmed vertex presentation of Twin B. AROM of Twin B amniotic sac at around -3 station. Head of Twin B delivered, no nuchal cord present. Shoulder and body delivered in usual fashion. Infant with spontaneous cry, placed on mother's abdomen, dried and stimulated. Cord clamped x 2 after 1-minute delay, and cut by father of baby. Cord blood drawn. Placentas delivered spontaneously with gentle cord traction. Fundus firm with massage and Pitocin. Labia, perineum, vagina, and cervix inspected inspected with 1st degree perineal laceration.   Placenta: came out together, spontaneous, intact, 3-vessel cords in both (sent to pathology) Complications: postpartum hemorrhage (2.4L) - given cytotec, TXA, 1L LR bolus and Ancef given  CBC and DIC panel drawn Lacerations: 1st degree repaired with 3-0 Vicryl EBL: 2452cc per [facility] Analgesia: epidural  Postpartum Planning [x]  message to sent to schedule follow-up  [x]  vaccines UTD  Infant: Vigorous female infants  APGARs 8, 64 (Twin A)  APGARs  9,9 (Twin B)  weights pending  Fergie Sherbert S. [name], DO OB/GYN Fellow, Faculty Practice

## 2018-08-06 ENCOUNTER — Encounter: Payer: Self-pay | Admitting: Obstetrics and Gynecology

## 2018-08-06 ENCOUNTER — Ambulatory Visit (INDEPENDENT_AMBULATORY_CARE_PROVIDER_SITE_OTHER): Payer: Medicaid Other | Admitting: Obstetrics and Gynecology

## 2018-08-06 VITALS — BP 129/82 | HR 93 | Wt 291.9 lb

## 2018-08-06 DIAGNOSIS — O99212 Obesity complicating pregnancy, second trimester: Secondary | ICD-10-CM

## 2018-08-06 DIAGNOSIS — O30049 Twin pregnancy, dichorionic/diamniotic, unspecified trimester: Secondary | ICD-10-CM

## 2018-08-06 DIAGNOSIS — Z6841 Body Mass Index (BMI) 40.0 and over, adult: Secondary | ICD-10-CM

## 2018-08-06 DIAGNOSIS — O30042 Twin pregnancy, dichorionic/diamniotic, second trimester: Secondary | ICD-10-CM

## 2018-08-06 DIAGNOSIS — O99210 Obesity complicating pregnancy, unspecified trimester: Secondary | ICD-10-CM

## 2018-08-06 DIAGNOSIS — O0992 Supervision of high risk pregnancy, unspecified, second trimester: Secondary | ICD-10-CM

## 2018-08-06 DIAGNOSIS — O0990 Supervision of high risk pregnancy, unspecified, unspecified trimester: Secondary | ICD-10-CM

## 2018-08-06 MED ORDER — PREPLUS 27-1 MG PO TABS: 1.0000 | ORAL_TABLET | Freq: Every day | ORAL | 3 refills | Status: DC

## 2018-08-06 MED ORDER — FOLIC ACID 1 MG PO TABS: 1.0000 mg | ORAL_TABLET | Freq: Every day | ORAL | 3 refills | Status: DC

## 2018-08-06 NOTE — Progress Notes (Signed)
Prenatal Visit Note Date: 08/06/2018 Clinic: [facility]  Subjective:  Autumn Duarte is a 23 y.o. G1P0 at [redacted]w[redacted]d being seen today for ongoing prenatal care.  She is currently monitored for the following issues for this high-risk pregnancy and has ECZEMA; PROBLEMS WITH HEARING; Supervision of high risk pregnancy, antepartum; Dichorionic diamniotic twin gestation; Morbid obesity with BMI of 50.0-59.9, adult (HCC); and Obesity in pregnancy on their problem list.  Patient reports no complaints.   Contractions: Not present. Vag. Bleeding: None.  Movement: Present. Denies leaking of fluid.   The following portions of the patient's history were reviewed and updated as appropriate: allergies, current medications, past family history, past medical history, past social history, past surgical history and problem list. Problem list updated.  Objective:   Vitals:   08/06/18 0917  BP: 129/82  Pulse: 93  Weight: 291 lb 14.4 oz (132.4 kg)    Fetal Status: Fetal Heart Rate (bpm): 150/159   Movement: Present     General:  Alert, oriented and cooperative. Patient is in no acute distress.  Skin: Skin is warm and dry. No rash noted.   Cardiovascular: Normal heart rate noted  Respiratory: Normal respiratory effort, no problems with respiration noted  Abdomen: Soft, gravid, appropriate for gestational age. Pain/Pressure: Present     Pelvic:  Cervical exam deferred        Extremities: Normal range of motion.  Edema: None  Mental Status: Normal mood and affect. Normal behavior. Normal judgment and thought content.   Urinalysis:      Assessment and Plan:  Pregnancy: G1P0 at [redacted]w[redacted]d  1. Obesity in pregnancy Weight stable. Pt congratulated on this  2. Morbid obesity with BMI of 50.0-59.9, adult (HCC)  3. Dichorionic diamniotic twin pregnancy, antepartum Normal anatomy u/s, cx length. Has rpt already scheduled. Extra FA sent in for patient  4. Supervision of high risk pregnancy,  antepartum Confirms low dose asa  Preterm labor symptoms and general obstetric precautions including but not limited to vaginal bleeding, contractions, leaking of fluid and fetal movement were reviewed in detail with the patient. Please refer to After Visit Summary for other counseling recommendations.  Return in about 22 days (around 08/28/2018) for hrob.   Kapowsin Bing, MD

## 2018-08-09 ENCOUNTER — Telehealth: Payer: Self-pay | Admitting: Obstetrics and Gynecology

## 2018-08-09 NOTE — Telephone Encounter (Signed)
Patient called in stated that she did not check out and also needs the upcoming appointment time and date. Informed of information, the patient stated she would attend the visit.

## 2018-08-21 ENCOUNTER — Telehealth: Payer: Self-pay | Admitting: *Deleted

## 2018-08-21 DIAGNOSIS — O0990 Supervision of high risk pregnancy, unspecified, unspecified trimester: Secondary | ICD-10-CM

## 2018-08-21 DIAGNOSIS — O30009 Twin pregnancy, unspecified number of placenta and unspecified number of amniotic sacs, unspecified trimester: Secondary | ICD-10-CM

## 2018-08-21 MED ORDER — ASPIRIN EC 81 MG PO TBEC: 81.0000 mg | DELAYED_RELEASE_TABLET | Freq: Every day | ORAL | 2 refills | Status: DC

## 2018-08-21 MED ORDER — PREPLUS 27-1 MG PO TABS: 1.0000 | ORAL_TABLET | Freq: Every day | ORAL | 3 refills | Status: DC

## 2018-08-21 MED ORDER — FOLIC ACID 1 MG PO TABS: 1.0000 mg | ORAL_TABLET | Freq: Every day | ORAL | 3 refills | Status: DC

## 2018-08-21 MED ORDER — METOCLOPRAMIDE HCL 10 MG PO TABS: 10.0000 mg | ORAL_TABLET | Freq: Four times a day (QID) | ORAL | 1 refills | Status: DC
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
## 2018-08-28 ENCOUNTER — Encounter (HOSPITAL_COMMUNITY): Payer: Self-pay

## 2018-08-28 ENCOUNTER — Ambulatory Visit (HOSPITAL_COMMUNITY)
Admission: RE | Admit: 2018-08-28 | Discharge: 2018-08-28 | Disposition: A | Discharge status: Home / Self Care | Payer: Medicaid Other | Source: Ambulatory Visit | Attending: Family Medicine | Admitting: Family Medicine

## 2018-08-28 ENCOUNTER — Other Ambulatory Visit (HOSPITAL_COMMUNITY): Payer: Self-pay | Admitting: *Deleted

## 2018-08-28 DIAGNOSIS — Z3A23 23 weeks gestation of pregnancy: Secondary | ICD-10-CM | POA: Diagnosis not present

## 2018-08-28 DIAGNOSIS — Z363 Encounter for antenatal screening for malformations: Secondary | ICD-10-CM | POA: Diagnosis not present

## 2018-08-28 DIAGNOSIS — O99212 Obesity complicating pregnancy, second trimester: Secondary | ICD-10-CM

## 2018-08-28 DIAGNOSIS — O30042 Twin pregnancy, dichorionic/diamniotic, second trimester: Secondary | ICD-10-CM | POA: Diagnosis not present

## 2018-08-28 DIAGNOSIS — O30049 Twin pregnancy, dichorionic/diamniotic, unspecified trimester: Secondary | ICD-10-CM

## 2018-08-31 ENCOUNTER — Other Ambulatory Visit: Payer: Self-pay

## 2018-08-31 ENCOUNTER — Encounter: Payer: Self-pay | Admitting: Obstetrics and Gynecology

## 2018-08-31 ENCOUNTER — Ambulatory Visit (INDEPENDENT_AMBULATORY_CARE_PROVIDER_SITE_OTHER): Payer: Medicaid Other | Admitting: Obstetrics and Gynecology

## 2018-08-31 VITALS — BP 114/67 | HR 113 | Wt 297.0 lb

## 2018-08-31 DIAGNOSIS — O0990 Supervision of high risk pregnancy, unspecified, unspecified trimester: Secondary | ICD-10-CM

## 2018-08-31 DIAGNOSIS — O0992 Supervision of high risk pregnancy, unspecified, second trimester: Secondary | ICD-10-CM

## 2018-08-31 DIAGNOSIS — O30042 Twin pregnancy, dichorionic/diamniotic, second trimester: Secondary | ICD-10-CM

## 2018-08-31 NOTE — Progress Notes (Signed)
Subjective:  Autumn Duarte is a 23 y.o. G1P0 at [redacted]w[redacted]d being seen today for ongoing prenatal care.  She is currently monitored for the following issues for this high-risk pregnancy and has ECZEMA; PROBLEMS WITH HEARING; Supervision of high risk pregnancy, antepartum; Dichorionic diamniotic twin gestation; Morbid obesity with BMI of 50.0-59.9, adult (HCC); and Obesity in pregnancy on their problem list.  Patient reports general discomforts of pregnancy.  Contractions: Not present. Vag. Bleeding: None.  Movement: Present. Denies leaking of fluid.   The following portions of the patient's history were reviewed and updated as appropriate: allergies, current medications, past family history, past medical history, past social history, past surgical history and problem list. Problem list updated.  Objective:   Vitals:   08/31/18 0837  BP: 114/67  Pulse: (!) 113  Weight: 297 lb (134.7 kg)    Fetal Status: Fetal Heart Rate (bpm): 162/143   Movement: Present     General:  Alert, oriented and cooperative. Patient is in no acute distress.  Skin: Skin is warm and dry. No rash noted.   Cardiovascular: Normal heart rate noted  Respiratory: Normal respiratory effort, no problems with respiration noted  Abdomen: Soft, gravid, appropriate for gestational age. Pain/Pressure: Present     Pelvic:  Cervical exam deferred        Extremities: Normal range of motion.  Edema: None  Mental Status: Normal mood and affect. Normal behavior. Normal judgment and thought content.   Urinalysis:      Assessment and Plan:  Pregnancy: G1P0 at [redacted]w[redacted]d  1. Supervision of high risk pregnancy, antepartum Stable Glucola next visit  2. Dichorionic diamniotic twin pregnancy in second trimester Appropriate growth F/U growth scan ordered Continue with BASA  Preterm labor symptoms and general obstetric precautions including but not limited to vaginal bleeding, contractions, leaking of fluid and fetal movement were reviewed in  detail with the patient. Please refer to After Visit Summary for other counseling recommendations.  Return in about 4 weeks (around 09/28/2018) for OB visit.   Hermina Staggers, MD

## 2018-08-31 NOTE — Patient Instructions (Signed)

## 2018-08-31 NOTE — Progress Notes (Signed)
Pt is having pain in lower abdomen & hips for about 4 days now. Took Tylenol but didn't help.

## 2018-09-25 ENCOUNTER — Other Ambulatory Visit (HOSPITAL_COMMUNITY): Payer: Self-pay | Admitting: *Deleted

## 2018-09-25 ENCOUNTER — Ambulatory Visit (HOSPITAL_COMMUNITY)
Admission: RE | Admit: 2018-09-25 | Discharge: 2018-09-25 | Disposition: A | Discharge status: Home / Self Care | Payer: Medicaid Other | Source: Ambulatory Visit | Attending: Family Medicine | Admitting: Family Medicine

## 2018-09-25 ENCOUNTER — Ambulatory Visit (HOSPITAL_COMMUNITY): Payer: Medicaid Other | Admitting: *Deleted

## 2018-09-25 ENCOUNTER — Encounter (HOSPITAL_COMMUNITY): Payer: Self-pay

## 2018-09-25 DIAGNOSIS — O30042 Twin pregnancy, dichorionic/diamniotic, second trimester: Secondary | ICD-10-CM | POA: Insufficient documentation

## 2018-09-25 DIAGNOSIS — Z3A27 27 weeks gestation of pregnancy: Secondary | ICD-10-CM

## 2018-09-25 DIAGNOSIS — O99210 Obesity complicating pregnancy, unspecified trimester: Secondary | ICD-10-CM

## 2018-09-25 DIAGNOSIS — O99212 Obesity complicating pregnancy, second trimester: Secondary | ICD-10-CM | POA: Diagnosis not present

## 2018-09-25 DIAGNOSIS — O0990 Supervision of high risk pregnancy, unspecified, unspecified trimester: Secondary | ICD-10-CM | POA: Insufficient documentation

## 2018-09-25 DIAGNOSIS — O30049 Twin pregnancy, dichorionic/diamniotic, unspecified trimester: Secondary | ICD-10-CM | POA: Diagnosis not present

## 2018-09-25 DIAGNOSIS — Z362 Encounter for other antenatal screening follow-up: Secondary | ICD-10-CM | POA: Diagnosis not present

## 2018-09-28 ENCOUNTER — Ambulatory Visit (INDEPENDENT_AMBULATORY_CARE_PROVIDER_SITE_OTHER): Payer: Medicaid Other | Admitting: Obstetrics & Gynecology

## 2018-09-28 ENCOUNTER — Other Ambulatory Visit: Payer: Medicaid Other

## 2018-09-28 ENCOUNTER — Encounter: Payer: Self-pay | Admitting: Obstetrics & Gynecology

## 2018-09-28 VITALS — BP 131/91 | HR 108 | Wt 303.1 lb

## 2018-09-28 DIAGNOSIS — O0990 Supervision of high risk pregnancy, unspecified, unspecified trimester: Secondary | ICD-10-CM

## 2018-09-28 DIAGNOSIS — O163 Unspecified maternal hypertension, third trimester: Secondary | ICD-10-CM

## 2018-09-28 DIAGNOSIS — O30042 Twin pregnancy, dichorionic/diamniotic, second trimester: Secondary | ICD-10-CM | POA: Diagnosis not present

## 2018-09-28 DIAGNOSIS — O0992 Supervision of high risk pregnancy, unspecified, second trimester: Secondary | ICD-10-CM | POA: Diagnosis not present

## 2018-09-28 DIAGNOSIS — O162 Unspecified maternal hypertension, second trimester: Secondary | ICD-10-CM

## 2018-09-28 DIAGNOSIS — O99210 Obesity complicating pregnancy, unspecified trimester: Secondary | ICD-10-CM

## 2018-09-28 DIAGNOSIS — Z3A27 27 weeks gestation of pregnancy: Secondary | ICD-10-CM | POA: Diagnosis not present

## 2018-09-28 DIAGNOSIS — O99212 Obesity complicating pregnancy, second trimester: Secondary | ICD-10-CM

## 2018-09-28 DIAGNOSIS — Z23 Encounter for immunization: Secondary | ICD-10-CM

## 2018-09-28 NOTE — Progress Notes (Signed)
PRENATAL VISIT NOTE  Subjective:  Autumn Duarte is a 23 y.o. G1P0 at [redacted]w[redacted]d being seen today for ongoing prenatal care.  She is currently monitored for the following issues for this high-risk pregnancy and has ECZEMA; PROBLEMS WITH HEARING; Supervision of high risk pregnancy, antepartum; Dichorionic diamniotic twin gestation; Morbid obesity with BMI of 50.0-59.9, adult (HCC); and Maternal morbid obesity, antepartum (HCC) on their problem list.  Patient reports nausea and vomiting this morning, threw up glucose drink for GTT this morning.  Also reported having severe headache x 2 days last weekend and seeing white and dark spots. Currently denies any headaches, visual symptoms, RUQ/epigastric pain or other concerning symptoms.  Contractions: Not present. Vag. Bleeding: None.  Movement: Present. Denies leaking of fluid.   The following portions of the patient's history were reviewed and updated as appropriate: allergies, current medications, past family history, past medical history, past social history, past surgical history and problem list. Problem list updated.  Objective:   Vitals:   09/28/18 0822  BP: (!) 131/91  Pulse: (!) 108  Weight: (!) 303 lb 1.6 oz (137.5 kg)   Fetal Status: Fetal Heart Rate (bpm): 155/146   Movement: Present     General:  Alert, oriented and cooperative. Patient is in no acute distress.  Skin: Skin is warm and dry. No rash noted.   Cardiovascular: Normal heart rate noted  Respiratory: Normal respiratory effort, no problems with respiration noted  Abdomen: Soft, gravid, appropriate for gestational age.  Pain/Pressure: Present     Pelvic: Cervical exam deferred        Extremities: Normal range of motion.  Edema: None  Mental Status: Normal mood and affect. Normal behavior. Normal judgment and thought content.   Imaging: [place] Mfm Ob Follow Up  Result Date: 09/25/2018 ----------------------------------------------------------------------  OBSTETRICS REPORT                          (Signed Final 09/25/2018 02:38 pm) ---------------------------------------------------------------------- Patient Info  ID #:        [id number]                          D.O.B.:  [birth year] (23 yrs)  Name:        Autumn Duarte                    Visit Date: 09/25/2018 01:44 pm ---------------------------------------------------------------------- Performed By  Performed By:      Lenise Arena        Ref. Address:      [facility]                     [facility]                                      OB[facility]                                                               [address]                                                               [place], [place]                                                               [address]  Attending:         Lin Landsman      [facility]  Referred By:       [facility] for [facility]                     Healthcare ---------------------------------------------------------------------- Orders   #  Description                           Code         Ordered By   1  [place] MFM OB FOLLOW UP                   60454.09     Lin Landsman   2  [place] MFM OB FOLLOW UP ADDL              81191.47     Lin Landsman      GEST  ----------------------------------------------------------------------   #  Order #                     Accession #                 Episode #   1  [id number]                   [id number]                  [id number]   2  [id number]                   [id number]                  [id number]  ---------------------------------------------------------------------- Indications   Antenatal screening for malformations           Z36.3   Twin pregnancy, di/di, second trimester         O30.042   Obesity complicating pregnancy, second          O99.212   trimester (pregravid  BMI 47)   [redacted] weeks gestation of pregnancy  Z3A.27  ---------------------------------------------------------------------- Vital Signs  Weight (lb):  304                               Height:        5'3"  BMI:          53.85 ---------------------------------------------------------------------- Fetal Evaluation (Fetus A)  Num Of Fetuses:          2  Fetal Heart Rate(bpm):   149  Cardiac Activity:        Observed  Fetal Lie:               Lower Right Fetus  Presentation:            Cephalic  Placenta:                Posterior  P. Cord Insertion:       Visualized  Membrane Desc:       Dividing Membrane seen - Dichorionic.  Amniotic Fluid  AFI FV:      Within normal limits                              Largest Pocket(cm)                              5.77 ---------------------------------------------------------------------- Biometry (Fetus A)  BPD:      69.5   mm     G. Age:  27w 6d         66  %    CI:          78.4  %    70 - 86                                                           FL/HC:       20.0  %    18.6 - 20.4  HC:      248.3   mm     G. Age:  27w 0d         19  %    HC/AC:       1.09       1.05 - 1.21  AC:      227.8   mm     G. Age:  27w 1d         43  %    FL/BPD:      71.4  %    71 - 87  FL:       49.6   mm     G. Age:  26w 5d         24  %    FL/AC:       21.8  %    20 - 24  HUM:      44.1   mm     G. Age:  26w 1d         25  %  LV:         4.2  mm  Est. FW:    1018   gm      2 lb 4 oz     51  %  FW Discordancy         5   % ---------------------------------------------------------------------- OB History  Gravidity:     1 ---------------------------------------------------------------------- Gestational Age (Fetus A)  LMP:            27w 1d       Date:  03/19/18                   EDD:  12/24/18  U/S Today:      27w 1d                                         EDD:  12/24/18  Best:           27w 1d    Det. By:  LMP  (03/19/18)            EDD:  12/24/18  ---------------------------------------------------------------------- Anatomy (Fetus A)  Cranium:                Appears normal         Aortic Arch:            Previously seen  Cavum:                  Appears normal         Ductal Arch:            Previously seen  Ventricles:             Appears normal         Diaphragm:              Appears normal  Choroid Plexus:         Previously seen        Stomach:                Appears normal, left                                                                         sided  Cerebellum:             Previously seen        Abdomen:                Appears normal  Posterior Fossa:        Previously seen        Abdominal Wall:         Previously seen  Nuchal Fold:            Previously seen        Cord Vessels:           Previously seen  Face:                   Orbits and profile     Kidneys:                Appear normal                          previously seen  Lips:  Previously seen        Bladder:                Appears normal  Thoracic:               Appears normal         Spine:                  Previously seen  Heart:                  Appears normal         Upper Extremities:      Previously seen                          (4CH, axis, and situs)  RVOT:                   Previously seen        Lower Extremities:      Previously seen  LVOT:                   Previously seen  Other:   Heels and right 5th digit visualized previously visualized. Lt 5th digit           visualized. Nasal bone visualized. Technically difficult due to maternal           habitus and fetal position. ---------------------------------------------------------------------- Fetal Evaluation (Fetus B)  Num Of Fetuses:          2  Fetal Heart Rate(bpm):   135  Cardiac Activity:        Observed  Fetal Lie:               Upper Left Fetus  Presentation:            Cephalic  Placenta:                Posterior  P. Cord Insertion:       Visualized, central  Membrane Desc:       Dividing Membrane  seen - Dichorionic.  Amniotic Fluid  AFI FV:      Within normal limits                              Largest Pocket(cm)                              5.36 ---------------------------------------------------------------------- Biometry (Fetus B)  BPD:      65.3   mm     G. Age:  26w 3d         16  %    CI:         75.73  %    70 - 86                                                           FL/HC:       21.6  %    18.6 - 20.4  HC:      237.9   mm     G. Age:  25w 6d        < 3  %  HC/AC:       1.01       1.05 - 1.21  AC:      235.2   mm     G. Age:  27w 6d         63  %    FL/BPD:      78.9  %    71 - 87  FL:       51.5   mm     G. Age:  27w 4d         47  %    FL/AC:       21.9  %    20 - 24  HUM:      47.8   mm     G. Age:  28w 0d         67  %  LV:         2.4  mm  Est. FW:    1075   gm      2 lb 6 oz     59  %     FW Discordancy      0 \ 5  % ---------------------------------------------------------------------- Gestational Age (Fetus B)  LMP:            27w 1d       Date:  03/19/18                   EDD:  12/24/18  U/S Today:      27w 0d                                         EDD:  12/25/18  Best:           27w 1d    Det. By:  LMP  (03/19/18)            EDD:  12/24/18 ---------------------------------------------------------------------- Anatomy (Fetus B)  Cranium:                Appears normal         Aortic Arch:            Appears normal  Cavum:                  Appears normal         Ductal Arch:            Previously seen  Ventricles:             Appears normal         Diaphragm:              Previously seen  Choroid Plexus:         Previously seen        Stomach:                Appears normal, left                                                                         sided  Cerebellum:             Previously  seen        Abdomen:                Appears normal  Posterior Fossa:        Previously seen        Abdominal Wall:         Previously seen  Nuchal Fold:            Previously seen        Cord  Vessels:           Previously seen  Face:                   Orbits and profile     Kidneys:                Previously seen                          previously seen  Lips:                   Previously seen        Bladder:                Appears normal  Thoracic:               Appears normal         Spine:                  Not well visualized  Heart:                  Appears normal         Upper Extremities:      Previously seen                          (4CH, axis, and situs)  RVOT:                   Appears normal         Lower Extremities:      Previously seen  LVOT:                   Previously seen  Other:   5th digit visualized.Left heel previously visualized. Right heel visualized           Technically difficult due to maternal habitus and fetal position. ---------------------------------------------------------------------- Cervix Uterus Adnexa  Cervix  Not visualized (advanced GA >24wks) ---------------------------------------------------------------------- Impression  Normal interval growth.  Normal placental cord insertion for Twin B ---------------------------------------------------------------------- Recommendations  Continue serial growth every 4 weeks. ----------------------------------------------------------------------               Lin Landsman, MD Electronically Signed Final Report   09/25/2018 02:38 pm ----------------------------------------------------------------------  [place] Mfm Ob Follow Up Addl Gest  Result Date: 09/25/2018 ----------------------------------------------------------------------  OBSTETRICS REPORT                         (Signed Final 09/25/2018 02:38 pm) ---------------------------------------------------------------------- Patient Info  ID #:        [id number]                          D.O.B.:  [birth year] (23 yrs)  Name:        Arletha Grippe L Skow  Visit Date: 09/25/2018 01:44 pm ---------------------------------------------------------------------- Performed By   Performed By:      Lenise Arena        Ref. Address:      [facility]                                      OB/Gyn Clinic                                                               [address]                                                               [place], [place]                                                               [address]  Attending:         Lin Landsman      Location[facility]  Referred By:       [facility] for [facility]                     Healthcare ---------------------------------------------------------------------- Orders   #  Description                           Code         Ordered By   1  [place] MFM OB FOLLOW UP                   60454.09     Lin Landsman   2  [place] MFM OB FOLLOW UP ADDL              [id number]  Lin Landsman      GEST  ----------------------------------------------------------------------   #  Order #                     Accession #                 Episode #   1  [id number]                   [id number]                  [id number]   2  [id number]                   [id number]                  [id number]  ---------------------------------------------------------------------- Indications   Antenatal screening for malformations           Z36.3   Twin pregnancy, di/di, second trimester         O30.042   Obesity complicating pregnancy, second          O99.212   trimester (pregravid BMI 47)   [redacted] weeks gestation of pregnancy                 Z3A.27  ---------------------------------------------------------------------- Vital Signs  Weight (lb):  304                               Height:        5'3"  BMI:          53.85 ---------------------------------------------------------------------- Fetal Evaluation (Fetus A)  Num Of Fetuses:          2  Fetal Heart  Rate(bpm):   149  Cardiac Activity:        Observed  Fetal Lie:               Lower Right Fetus  Presentation:            Cephalic  Placenta:                Posterior  P. Cord Insertion:       Visualized  Membrane Desc:       Dividing Membrane seen - Dichorionic.  Amniotic Fluid  AFI FV:      Within normal limits                              Largest Pocket(cm)                              5.77 ---------------------------------------------------------------------- Biometry (Fetus A)  BPD:      69.5   mm     G. Age:  27w 6d         66  %    CI:          78.4  %    70 - 86                                                           FL/HC:       20.0  %  18.6 - 20.4  HC:      248.3   mm     G. Age:  27w 0d         19  %    HC/AC:       1.09       1.05 - 1.21  AC:      227.8   mm     G. Age:  27w 1d         43  %    FL/BPD:      71.4  %    71 - 87  FL:       49.6   mm     G. Age:  26w 5d         24  %    FL/AC:       21.8  %    20 - 24  HUM:      44.1   mm     G. Age:  26w 1d         25  %  LV:         4.2  mm  Est. FW:    1018   gm      2 lb 4 oz     51  %     FW Discordancy         5   % ---------------------------------------------------------------------- OB History  Gravidity:     1 ---------------------------------------------------------------------- Gestational Age (Fetus A)  LMP:            27w 1d       Date:  03/19/18                   EDD:  12/24/18  U/S Today:      27w 1d                                         EDD:  12/24/18  Best:           27w 1d    Det. By:  LMP  (03/19/18)            EDD:  12/24/18 ---------------------------------------------------------------------- Anatomy (Fetus A)  Cranium:                Appears normal         Aortic Arch:            Previously seen  Cavum:                  Appears normal         Ductal Arch:            Previously seen  Ventricles:             Appears normal         Diaphragm:              Appears normal  Choroid Plexus:         Previously seen        Stomach:                 Appears normal, left  sided  Cerebellum:             Previously seen        Abdomen:                Appears normal  Posterior Fossa:        Previously seen        Abdominal Wall:         Previously seen  Nuchal Fold:            Previously seen        Cord Vessels:           Previously seen  Face:                   Orbits and profile     Kidneys:                Appear normal                          previously seen  Lips:                   Previously seen        Bladder:                Appears normal  Thoracic:               Appears normal         Spine:                  Previously seen  Heart:                  Appears normal         Upper Extremities:      Previously seen                          (4CH, axis, and situs)  RVOT:                   Previously seen        Lower Extremities:      Previously seen  LVOT:                   Previously seen  Other:   Heels and right 5th digit visualized previously visualized. Lt 5th digit           visualized. Nasal bone visualized. Technically difficult due to maternal           habitus and fetal position. ---------------------------------------------------------------------- Fetal Evaluation (Fetus B)  Num Of Fetuses:          2  Fetal Heart Rate(bpm):   135  Cardiac Activity:        Observed  Fetal Lie:               Upper Left Fetus  Presentation:            Cephalic  Placenta:                Posterior  P. Cord Insertion:       Visualized, central  Membrane Desc:       Dividing Membrane seen - Dichorionic.  Amniotic Fluid  AFI FV:      Within normal limits  Largest Pocket(cm)                              5.36 ---------------------------------------------------------------------- Biometry (Fetus B)  BPD:      65.3   mm     G. Age:  26w 3d         16  %    CI:         75.73  %    70 - 86                                                           FL/HC:       21.6  %     18.6 - 20.4  HC:      237.9   mm     G. Age:  25w 6d        < 3  %    HC/AC:       1.01       1.05 - 1.21  AC:      235.2   mm     G. Age:  27w 6d         63  %    FL/BPD:      78.9  %    71 - 87  FL:       51.5   mm     G. Age:  27w 4d         47  %    FL/AC:       21.9  %    20 - 24  HUM:      47.8   mm     G. Age:  28w 0d         67  %  LV:         2.4  mm  Est. FW:    1075   gm      2 lb 6 oz     59  %     FW Discordancy      0 \ 5  % ---------------------------------------------------------------------- Gestational Age (Fetus B)  LMP:            27w 1d       Date:  03/19/18                   EDD:  12/24/18  U/S Today:      27w 0d                                         EDD:  12/25/18  Best:           27w 1d    Det. By:  LMP  (03/19/18)            EDD:  12/24/18 ---------------------------------------------------------------------- Anatomy (Fetus B)  Cranium:                Appears normal         Aortic Arch:            Appears normal  Cavum:  Appears normal         Ductal Arch:            Previously seen  Ventricles:             Appears normal         Diaphragm:              Previously seen  Choroid Plexus:         Previously seen        Stomach:                Appears normal, left                                                                         sided  Cerebellum:             Previously seen        Abdomen:                Appears normal  Posterior Fossa:        Previously seen        Abdominal Wall:         Previously seen  Nuchal Fold:            Previously seen        Cord Vessels:           Previously seen  Face:                   Orbits and profile     Kidneys:                Previously seen                          previously seen  Lips:                   Previously seen        Bladder:                Appears normal  Thoracic:               Appears normal         Spine:                  Not well visualized  Heart:                  Appears normal         Upper Extremities:       Previously seen                          (4CH, axis, and situs)  RVOT:                   Appears normal         Lower Extremities:      Previously seen  LVOT:                   Previously seen  Other:   5th digit visualized.Left heel previously visualized. Right heel visualized  Technically difficult due to maternal habitus and fetal position. ---------------------------------------------------------------------- Cervix Uterus Adnexa  Cervix  Not visualized (advanced GA >24wks) ---------------------------------------------------------------------- Impression  Normal interval growth.  Normal placental cord insertion for Twin B ---------------------------------------------------------------------- Recommendations  Continue serial growth every 4 weeks. ----------------------------------------------------------------------               Lin Landsman, MD Electronically Signed Final Report   09/25/2018 02:38 pm ----------------------------------------------------------------------   Assessment and Plan:  Pregnancy: G1P0 at [redacted]w[redacted]d  1. Elevated blood pressure affecting pregnancy in third trimester, antepartum Patient is at high risk for preeclampsia given her habitus, twin gestation and age.  Preeclampsia precautions strictly reviewed. Will check labs today, will follow up results and manage accordingly.  Reevaluate in one week.  - Comprehensive metabolic panel & CBC - Protein / creatinine ratio, urine  2. Dichorionic diamniotic twin pregnancy in second trimester Concordant growth, continue serial scans as per MFM. Will need antenatal testing later in the third trimester.  3. Maternal morbid obesity, antepartum (HCC) TWG so far is 33 lb, exceeds recommendations of 11-20 lb.  Nutritionist consult recommended next visit.  4. Supervision of high risk pregnancy, antepartum Given that she was unable to do GTT today, will check follow up fasting glucose level already drawn, HgA1C. Patient to retry  next week after taking an antiemetic; has Reglan at home.  Also reassured about hip pain, can take Tylenol as needed. - Hemoglobin A1c - Tdap vaccine greater than or equal to 7yo IM Preterm labor symptoms and general obstetric precautions including but not limited to vaginal bleeding, contractions, leaking of fluid and fetal movement were reviewed in detail with the patient. Please refer to After Visit Summary for other counseling recommendations.  Return in about 6 days (around 10/04/2018) for 2 hr GTT, BP follow up, OB Visit (HOB) **Needs Nutrition consult**.  Future Appointments  Date Time Provider Department Center  10/23/2018  3:20 PM [facility] NURSE [facility]  10/23/2018  3:30 PM [facility] [place] 5 [facility]    Jaynie Collins, MD

## 2018-09-28 NOTE — Progress Notes (Unsigned)
Reshedule 2 hr GTT due to emesis.     [employer], CPT  Phlebotomist

## 2018-09-28 NOTE — Patient Instructions (Addendum)
Return to office for any scheduled appointments. Call the office or go to the MAU at [facility] if:  You begin to have strong, frequent contractions  Your water breaks.  Sometimes it is a big gush of fluid, sometimes it is just a trickle that keeps getting your panties wet or running down your legs  You have vaginal bleeding.  It is normal to have a small amount of spotting if your cervix was checked.   You do not feel your baby moving like normal.  If you do not, get something to eat and drink and lay down and focus on feeling your baby move.   If your baby is still not moving like normal, you should call the office or go to MAU.  Any other obstetric concerns.    Hypertension During Pregnancy  Hypertension, commonly called high blood pressure, is when the force of blood pumping through your arteries is too strong. Arteries are blood vessels that carry blood from the heart throughout the body. Hypertension during pregnancy can cause problems for you and your baby. Your baby may be born early (prematurely) or may not weigh as much as he or she should at birth. Very bad cases of hypertension during pregnancy can be life-threatening. Different types of hypertension can occur during pregnancy. These include:  Chronic hypertension. This happens when: ? You have hypertension before pregnancy and it continues during pregnancy. ? You develop hypertension before you are [redacted] weeks pregnant, and it continues during pregnancy.  Gestational hypertension. This is hypertension that develops after the 20th week of pregnancy.  Preeclampsia, also called toxemia of pregnancy. This is a very serious type of hypertension that develops during pregnancy. It can be very dangerous for you and your baby. ? In rare cases, you may develop preeclampsia after giving birth (postpartum preeclampsia). This usually occurs within 48 hours after childbirth but may occur up to 6 weeks after giving  birth. Gestational hypertension and preeclampsia usually go away within 6 weeks after your baby is born. Women who have hypertension during pregnancy have a greater chance of developing hypertension later in life or during future pregnancies. What are the causes? The exact cause of hypertension during pregnancy is not known. What increases the risk? There are certain factors that make it more likely for you to develop hypertension during pregnancy. These include:  Having hypertension during a previous pregnancy or prior to pregnancy.  Being overweight.  Being age 35 or older.  Being pregnant for the first time.  Being pregnant with more than one baby.  Becoming pregnant using fertilization methods such as IVF (in vitro fertilization).  Having diabetes, kidney problems, or systemic lupus erythematosus.  Having a family history of hypertension. What are the signs or symptoms? Chronic hypertension and gestational hypertension rarely cause symptoms. Preeclampsia causes symptoms, which may include:  Increased protein in your urine. Your health care provider will check for this at every visit before you give birth (prenatal visit).  Severe headaches.  Sudden weight gain.  Swelling of the hands, face, legs, and feet.  Nausea and vomiting.  Vision problems, such as blurred or double vision.  Numbness in the face, arms, legs, and feet.  Dizziness.  Slurred speech.  Sensitivity to bright lights.  Abdominal pain.  Convulsions or seizures. How is this diagnosed? You may be diagnosed with hypertension during a routine prenatal exam. At each prenatal visit, you may:  Have a urine test to check for high amounts   of protein in your urine.  Have your blood pressure checked. A blood pressure reading is given as two numbers, such as "120 over 80" (or 120/80). The first ("top") number is a measure of the pressure in your arteries when your heart beats (systolic pressure). The second  ("bottom") number is a measure of the pressure in your arteries as your heart relaxes between beats (diastolic pressure). Blood pressure is measured in a unit called mm Hg. For most women, a normal blood pressure reading is: ? Systolic: below 120. ? Diastolic: below 80. The type of hypertension that you are diagnosed with depends on your test results and when your symptoms developed.  Chronic hypertension is usually diagnosed before 20 weeks of pregnancy.  Gestational hypertension is usually diagnosed after 20 weeks of pregnancy.  Hypertension with high amounts of protein in the urine is diagnosed as preeclampsia.  Blood pressure measurements that stay above 160 systolic, or above 110 diastolic, are signs of severe preeclampsia. How is this treated? Treatment for hypertension during pregnancy varies depending on the type of hypertension you have and how serious it is.  If you take medicines called ACE inhibitors to treat chronic hypertension, you may need to switch medicines. ACE inhibitors should not be taken during pregnancy.  If you have gestational hypertension, you may need to take blood pressure medicine.  If you are at risk for preeclampsia, your health care provider may recommend that you take a low-dose aspirin during your pregnancy.  If you have severe preeclampsia, you may need to be hospitalized so you and your baby can be monitored closely. You may also need to take medicine (magnesium sulfate) to prevent seizures and to lower blood pressure. This medicine may be given as an injection or through an IV.  In some cases, if your condition gets worse, you may need to deliver your baby early. Follow these instructions at home: Eating and drinking   Drink enough fluid to keep your urine pale yellow.  Avoid caffeine. Lifestyle  Do not use any products that contain nicotine or tobacco, such as cigarettes and e-cigarettes. If you need help quitting, ask your health care  provider.  Do not use alcohol or drugs.  Avoid stress as much as possible. Rest and get plenty of sleep. General instructions  Take over-the-counter and prescription medicines only as told by your health care provider.  While lying down, lie on your left side. This keeps pressure off your major blood vessels.  While sitting or lying down, raise (elevate) your feet. Try putting some pillows under your lower legs.  Exercise regularly. Ask your health care provider what kinds of exercise are best for you.  Keep all prenatal and follow-up visits as told by your health care provider. This is important. Contact a health care provider if:  You have symptoms that your health care provider told you may require more treatment or monitoring, such as: ? Nausea or vomiting. ? Headache. Get help right away if you have:  Severe abdominal pain that does not get better with treatment.  A severe headache that does not get better.  Vomiting that does not get better.  Sudden, rapid weight gain.  Sudden swelling in your hands, ankles, or face.  Vaginal bleeding.  Blood in your urine.  Fewer movements from your baby than usual.  Blurred or double vision.  Muscle twitching or sudden muscle tightening (spasms).  Shortness of breath.  Blue fingernails or lips. Summary  Hypertension, commonly called high blood pressure,   is when the force of blood pumping through your arteries is too strong.  Hypertension during pregnancy can cause problems for you and your baby.  Treatment for hypertension during pregnancy varies depending on the type of hypertension you have and how serious it is.  Get help right away if you have symptoms that your health care provider told you to watch for. This information is not intended to replace advice given to you by your health care provider. Make sure you discuss any questions you have with your health care provider. Document Released: 04/05/2011 Document  Revised: 07/04/2017 Document Reviewed: 01/01/2016 Elsevier Interactive Patient Education  2019 Elsevier Inc.  

## 2018-09-28 NOTE — Progress Notes (Signed)
Pt complains of 6/10 right hip pain that started this morning.

## 2018-09-29 LAB — PROTEIN / CREATININE RATIO, URINE
Creatinine, Urine: 135.2 mg/dL
Protein, Ur: 14.5 mg/dL
Protein/Creat Ratio: 107 mg/g creat (ref 0–200)

## 2018-10-01 LAB — CBC
Hematocrit: 32.2 % — ABNORMAL LOW (ref 34.0–46.6)
Hemoglobin: 10.8 g/dL — ABNORMAL LOW (ref 11.1–15.9)
MCH: 30.5 pg (ref 26.6–33.0)
MCHC: 33.5 g/dL (ref 31.5–35.7)
MCV: 91 fL (ref 79–97)
Platelets: 277 10*3/uL (ref 150–450)
RBC: 3.54 x10E6/uL — ABNORMAL LOW (ref 3.77–5.28)
RDW: 13.7 % (ref 11.7–15.4)
WBC: 9.5 10*3/uL (ref 3.4–10.8)

## 2018-10-01 LAB — COMPREHENSIVE METABOLIC PANEL
ALT: 13 IU/L (ref 0–32)
AST: 13 IU/L (ref 0–40)
Albumin/Globulin Ratio: 1.3 (ref 1.2–2.2)
Albumin: 3.5 g/dL — ABNORMAL LOW (ref 3.9–5.0)
Alkaline Phosphatase: 50 IU/L (ref 39–117)
BUN / CREAT RATIO: 10 (ref 9–23)
BUN: 7 mg/dL (ref 6–20)
Bilirubin Total: <0.2 mg/dL (ref 0.0–1.2)
CO2: 18 mmol/L — ABNORMAL LOW (ref 20–29)
Calcium: 8.9 mg/dL (ref 8.7–10.2)
Chloride: 106 mmol/L (ref 96–106)
Creatinine, Ser: 0.67 mg/dL (ref 0.57–1.00)
GFR calc Af Amer: 145 mL/min/{1.73_m2} (ref >59)
GFR calc non Af Amer: 126 mL/min/{1.73_m2} (ref >59)
Globulin, Total: 2.8 g/dL (ref 1.5–4.5)
Glucose: 90 mg/dL (ref 65–99)
Potassium: 3.8 mmol/L (ref 3.5–5.2)
Sodium: 141 mmol/L (ref 134–144)
Total Protein: 6.3 g/dL (ref 6.0–8.5)

## 2018-10-01 LAB — GLUCOSE TOLERANCE, 2 HOURS W/ 1HR: GLUCOSE, FASTING: 90 mg/dL (ref 65–91)

## 2018-10-01 LAB — HEMOGLOBIN A1C
Est. average glucose Bld gHb Est-mCnc: 103 mg/dL
Hgb A1c MFr Bld: 5.2 % (ref 4.8–5.6)

## 2018-10-01 LAB — HIV ANTIBODY (ROUTINE TESTING W REFLEX): HIV Screen 4th Generation wRfx: NONREACTIVE

## 2018-10-01 LAB — RPR: RPR Ser Ql: NONREACTIVE

## 2018-10-04 ENCOUNTER — Other Ambulatory Visit: Payer: Self-pay | Admitting: *Deleted

## 2018-10-04 ENCOUNTER — Ambulatory Visit (INDEPENDENT_AMBULATORY_CARE_PROVIDER_SITE_OTHER): Payer: Medicaid Other | Admitting: Obstetrics & Gynecology

## 2018-10-04 ENCOUNTER — Other Ambulatory Visit: Payer: Medicaid Other

## 2018-10-04 VITALS — BP 104/54 | HR 111 | Wt 304.0 lb

## 2018-10-04 DIAGNOSIS — O0990 Supervision of high risk pregnancy, unspecified, unspecified trimester: Secondary | ICD-10-CM

## 2018-10-04 DIAGNOSIS — Z3A28 28 weeks gestation of pregnancy: Secondary | ICD-10-CM

## 2018-10-04 DIAGNOSIS — O30043 Twin pregnancy, dichorionic/diamniotic, third trimester: Secondary | ICD-10-CM

## 2018-10-04 NOTE — Progress Notes (Signed)
   PRENATAL VISIT NOTE  Subjective:  Autumn Duarte is a 23 y.o. G1P0 at [redacted]w[redacted]d being seen today for ongoing prenatal care.  She is currently monitored for the following issues for this high-risk pregnancy and has ECZEMA; PROBLEMS WITH HEARING; Supervision of high risk pregnancy, antepartum; Dichorionic diamniotic twin gestation; Morbid obesity with BMI of 50.0-59.9, adult (HCC); and Maternal morbid obesity, antepartum (HCC) on their problem list.  Patient reports no complaints.  Contractions: Not present. Vag. Bleeding: None.  Movement: Present. Denies leaking of fluid.   The following portions of the patient's history were reviewed and updated as appropriate: allergies, current medications, past family history, past medical history, past social history, past surgical history and problem list. Problem list updated.  Objective:   Vitals:   10/04/18 1048  BP: (!) 104/54  Pulse: (!) 111  Weight: (!) 304 lb (137.9 kg)    Fetal Status: Fetal Heart Rate (bpm): 144/156 Fundal Height: 35 cm Movement: Present     General:  Alert, oriented and cooperative. Patient is in no acute distress.  Skin: Skin is warm and dry. No rash noted.   Cardiovascular: Normal heart rate noted  Respiratory: Normal respiratory effort, no problems with respiration noted  Abdomen: Soft, gravid, appropriate for gestational age.  Pain/Pressure: Absent     Pelvic: Cervical exam deferred        Extremities: Normal range of motion.  Edema: None  Mental Status: Normal mood and affect. Normal behavior. Normal judgment and thought content.   Assessment and Plan:  Pregnancy: G1P0 at [redacted]w[redacted]d  1. Supervision of high risk pregnancy, antepartum Doing well  2. Dichorionic diamniotic twin pregnancy in third trimester growth [place] nl   Preterm labor symptoms and general obstetric precautions including but not limited to vaginal bleeding, contractions, leaking of fluid and fetal movement were reviewed in detail with the  patient. Please refer to After Visit Summary for other counseling recommendations.  Return in about 2 weeks (around 10/18/2018).  Future Appointments  Date Time Provider Department Center  10/09/2018  8:15 AM [facility]  10/18/2018  2:15 PM Reva Bores, MD [facility]  10/23/2018  3:20 PM [facility] NURSE [facility]  10/23/2018  3:30 PM [facility] [place] 5 [facility]    Scheryl Darter, MD

## 2018-10-04 NOTE — Patient Instructions (Signed)
Multiple Pregnancy Having a multiple pregnancy means that a woman is carrying more than one baby at a time. She may be pregnant with twins, triplets, or more. The majority of multiple pregnancies are twins. Naturally conceiving triplets or more (higher-order multiples) is rare. Multiple pregnancies are riskier than single pregnancies. A woman with a multiple pregnancy is more likely to have certain problems during her pregnancy. Therefore, she will need to have more frequent appointments for prenatal care. How does a multiple pregnancy happen? A multiple pregnancy happens when:  The woman's body releases more than one egg at a time, and then each egg gets fertilized by a different sperm. ? This is the most common type of multiple pregnancy. ? Twins or other multiples produced this way are fraternal. They are no more alike than non-multiple siblings are.  One sperm fertilizes one egg, which then divides into more than one embryo. ? Twins or other multiples produced this way are identical. Identical multiples are always the same gender, and they look very much alike. Who is most likely to have a multiple pregnancy? A multiple pregnancy is more likely to develop in women who:  Have had fertility treatment, especially if the treatment included fertility drugs.  Are older than 21 years of age.  Have already had four or more children.  Have a family history of multiple pregnancy. How is a multiple pregnancy diagnosed? A multiple pregnancy may be diagnosed based on:  Symptoms such as: ? Rapid weight gain in the first 3 months of pregnancy (first trimester). ? More severe nausea and breast tenderness than what is typical of a single pregnancy. ? The uterus measuring larger than what is normal for the stage of the pregnancy.  Blood tests that detect a higher-than-normal level of human chorionic gonadotropin (hCG). This is a hormone that your body produces in early pregnancy.  Ultrasound exam.  This is used to confirm that you are carrying multiples. What risks are associated with multiple pregnancy? A multiple pregnancy puts you at a higher risk for certain problems during or after your pregnancy, including:  Having your babies delivered before you have reached a full-term pregnancy (preterm birth). A full-term pregnancy lasts for at least 37 weeks. Babies born before 37 weeks may have a higher risk of a variety of health problems, such as breathing problems, feeding difficulties, cerebral palsy, and learning disabilities.  Diabetes.  Preeclampsia. This is a serious condition that causes high blood pressure along with other symptoms, such as swelling and headaches, during pregnancy.  Excessive blood loss after childbirth (postpartum hemorrhage).  Postpartum depression.  Low birth weight of the babies. How will having a multiple pregnancy affect my care? Your health care provider will want to monitor you more closely during your pregnancy to make sure that your babies are growing normally and that you are healthy. Follow these instructions at home: Because your pregnancy is considered to be high risk, you will need to work closely with your health care team. You may also need to make some lifestyle changes. These may include the following: Eating and drinking  Increase your nutrition. ? Follow your health care provider's recommendations for weight gain. You may need to gain a little extra weight when you are pregnant with multiples. ? Eat healthy snacks often throughout the day. This can add calories and reduce nausea.  Drink enough fluid to keep your urine pale yellow.  Take prenatal vitamins. Activity By 20-24 weeks, you may need to limit your activities.    Avoid activities and work that take a lot of effort (are strenuous).  Ask your health care provider when you should stop having sexual intercourse.  Rest often. General instructions  Do not use any products that  contain nicotine or tobacco, such as cigarettes and e-cigarettes. If you need help quitting, ask your health care provider.  Do not drink alcohol or use illegal drugs.  Take over-the-counter and prescription medicines only as told by your health care provider.  Arrange for extra help around the house.  Keep all follow-up visits and all prenatal visits as told by your health care provider. This is important. Contact a health care provider if:  You have dizziness.  You have persistent nausea, vomiting, or diarrhea.  You are having trouble gaining weight.  You have feelings of depression or other emotions that are interfering with your normal activities. Get help right away if:  You have a fever.  You have pain with urination.  You have fluid leaking from your vagina.  You have a bad-smelling vaginal discharge.  You notice increased swelling in your face, hands, legs, or ankles.  You have spotting or bleeding from your vagina.  You have pelvic cramps, pelvic pressure, or nagging pain in your abdomen or lower back.  You are having regular contractions.  You develop a severe headache, with or without visual changes.  You have shortness of breath or chest pain.  You notice less fetal movement, or no fetal movement. Summary  Having a multiple pregnancy means that a woman is carrying more than one baby at a time.  A multiple pregnancy puts you at a higher risk for certain problems during and after your pregnancy, such as: having your babies delivered before you have reached a full-term pregnancy (preterm birth), diabetes, preeclampsia, excessive blood loss after childbirth (postpartum hemorrhage), postpartum depression, or low birth weight of the babies.  Your health care provider will want to monitor you more closely during your pregnancy to make sure that your babies are growing normally and that you are healthy.  You may need to make some lifestyle changes during  pregnancy, including: increasing your nutrition, limiting your activities after 20-24 weeks of pregnancy, and arranging for extra help around the house. This information is not intended to replace advice given to you by your health care provider. Make sure you discuss any questions you have with your health care provider. Document Released: 04/26/2008 Document Revised: 04/12/2017 Document Reviewed: 03/18/2016 Elsevier Interactive Patient Education  2019 Elsevier Inc.  

## 2018-10-05 LAB — GLUCOSE TOLERANCE, 2 HOURS W/ 1HR
Glucose, 1 hour: 151 mg/dL (ref 65–179)
Glucose, 2 hour: 102 mg/dL (ref 65–152)
Glucose, Fasting: 80 mg/dL (ref 65–91)

## 2018-10-09 ENCOUNTER — Other Ambulatory Visit: Payer: Medicaid Other

## 2018-10-16 ENCOUNTER — Ambulatory Visit (INDEPENDENT_AMBULATORY_CARE_PROVIDER_SITE_OTHER): Payer: Self-pay | Admitting: Pediatrics

## 2018-10-16 ENCOUNTER — Other Ambulatory Visit: Payer: Self-pay

## 2018-10-16 DIAGNOSIS — Z7681 Expectant parent(s) prebirth pediatrician visit: Secondary | ICD-10-CM

## 2018-10-16 NOTE — Progress Notes (Signed)
Prenatal counseling for impending newborn done--1st child, Dichorionic diamniotic twins, prenatal at 17wks Z30.81  --prenatal visit done over phone.

## 2018-10-17 ENCOUNTER — Telehealth: Payer: Self-pay

## 2018-10-17 NOTE — Telephone Encounter (Signed)
Pt called into the front desk complaining of pain, when speaking to the patient she denies any leaking of fluids or bleeding and states that baby is moving a lot. After accessing patients pain level she states its a 7/10 and that its below her belly in the pelvic area, advised patient to take up to 500mg  of tylenol and to stick a pillow between her legs and lay down to see if the pain subsides. Patient verbalized understanding and had no  Questions.

## 2018-10-18 ENCOUNTER — Encounter: Payer: Medicaid Other | Attending: Obstetrics & Gynecology | Admitting: *Deleted

## 2018-10-18 ENCOUNTER — Ambulatory Visit (INDEPENDENT_AMBULATORY_CARE_PROVIDER_SITE_OTHER): Payer: Medicaid Other | Admitting: Family Medicine

## 2018-10-18 ENCOUNTER — Other Ambulatory Visit: Payer: Self-pay

## 2018-10-18 ENCOUNTER — Other Ambulatory Visit: Payer: Self-pay | Admitting: Certified Nurse Midwife

## 2018-10-18 ENCOUNTER — Ambulatory Visit: Payer: Medicaid Other | Admitting: *Deleted

## 2018-10-18 VITALS — BP 121/76 | HR 124 | Wt 305.4 lb

## 2018-10-18 DIAGNOSIS — O30043 Twin pregnancy, dichorionic/diamniotic, third trimester: Secondary | ICD-10-CM | POA: Insufficient documentation

## 2018-10-18 DIAGNOSIS — Z3A30 30 weeks gestation of pregnancy: Secondary | ICD-10-CM

## 2018-10-18 DIAGNOSIS — O99213 Obesity complicating pregnancy, third trimester: Secondary | ICD-10-CM | POA: Insufficient documentation

## 2018-10-18 DIAGNOSIS — Z3A00 Weeks of gestation of pregnancy not specified: Secondary | ICD-10-CM | POA: Insufficient documentation

## 2018-10-18 DIAGNOSIS — O24419 Gestational diabetes mellitus in pregnancy, unspecified control: Secondary | ICD-10-CM | POA: Insufficient documentation

## 2018-10-18 DIAGNOSIS — O0990 Supervision of high risk pregnancy, unspecified, unspecified trimester: Secondary | ICD-10-CM

## 2018-10-18 NOTE — Patient Instructions (Signed)

## 2018-10-18 NOTE — Progress Notes (Signed)
Medical Nutrition Therapy:  Appt start time: 1500 end time:  1600.   Assessment:  Primary concerns today: pregnancy with twins, excessive weight gain.   Preferred Learning Style:   No preference indicated   Learning Readiness:   Change in progress   MEDICATIONS: see list   DIETARY INTAKE:  Usual eating pattern includes 2 meals and 1-2 snacks per day.  Everyday foods include sandwiches, cereal with milk, hot meals.  Avoided foods include sugary foods.    24-hr recall:  B ( AM): skips oftne  Snk ( AM): none  L ( PM): [place] sandwich with chips OR cereal with milk Snk ( PM): none D ( PM): hot meal with meat, starch and vegetables Snk ( PM): occasional popcorn or chips Beverages: 4 oz juice or water  Usual physical activity: walks her dog around her apartment complex for about 20 minutes occasionally  Estimated energy needs: 2000 calories 225 g carbohydrates 150 g protein 56 g fat  Progress Towards Goal(s):  Modified goal(s).   Nutritional Diagnosis:  NI-1.5 Excessive energy intake As related to activity level.  As evidenced by excessive weight gain during pregnancy.    Intervention:  Nutrition counseling for healthy weight maintenance initiated. Discussed Carb Counting by food group as method of portion control, reading food labels, and benefits of increased activity. Patient activity preference is inside and alone so I reviewed Arm Chair Exercises with her. Recommend 10-15 minutes twice daily. Patient states willing to try that.  Teaching Method Utilized:  Visual Auditory Hands on  Handouts given during visit include:  Nutrition and Food Labels   Barriers to learning/adherence to lifestyle change: none  Demonstrated degree of understanding via:  Teach Back   Monitoring/Evaluation:  Dietary intake, exercise, reading food labels, and body weight prn.

## 2018-10-18 NOTE — Progress Notes (Signed)
   PRENATAL VISIT NOTE  Subjective:  Autumn Duarte is a 23 y.o. G1P0 at [redacted]w[redacted]d being seen today for ongoing prenatal care.  She is currently monitored for the following issues for this low-risk pregnancy and has ECZEMA; PROBLEMS WITH HEARING; Supervision of high risk pregnancy, antepartum; Dichorionic diamniotic twin gestation; Morbid obesity with BMI of 50.0-59.9, adult (HCC); and Maternal morbid obesity, antepartum (HCC) on their problem list.  Patient reports no complaints.  Contractions: Not present. Vag. Bleeding: None.  Movement: Present. Denies leaking of fluid.   The following portions of the patient's history were reviewed and updated as appropriate: allergies, current medications, past family history, past medical history, past social history, past surgical history and problem list.   Objective:   Vitals:   10/18/18 1419  BP: 121/76  Pulse: (!) 124  Weight: (!) 305 lb 6.4 oz (138.5 kg)    Fetal Status: Fetal Heart Rate (bpm): 151/159 Fundal Height: 36 cm Movement: Present     General:  Alert, oriented and cooperative. Patient is in no acute distress.  Skin: Skin is warm and dry. No rash noted.   Cardiovascular: Normal heart rate noted  Respiratory: Normal respiratory effort, no problems with respiration noted  Abdomen: Soft, gravid, appropriate for gestational age.  Pain/Pressure: Present     Pelvic: Cervical exam deferred        Extremities: Normal range of motion.  Edema: Trace  Mental Status: Normal mood and affect. Normal behavior. Normal judgment and thought content.   Assessment and Plan:  Pregnancy: G1P0 at [redacted]w[redacted]d 1. Supervision of high risk pregnancy, antepartum Continue prenatal care.   2. Dichorionic diamniotic twin pregnancy in third trimester Normally grown, has f/u with MFM  Preterm labor symptoms and general obstetric precautions including but not limited to vaginal bleeding, contractions, leaking of fluid and fetal movement were reviewed in detail with the  patient. Please refer to After Visit Summary for other counseling recommendations.   Return in 2 weeks (on 11/01/2018).  Future Appointments  Date Time Provider Department Center  10/23/2018  3:20 PM [facility]URSE [facility]  10/23/2018  3:30 PM [facility] [place] 5 [facility]  11/01/2018  3:15 PM Reva Bores, MD [facility]    Reva Bores, MD

## 2018-10-23 ENCOUNTER — Ambulatory Visit (HOSPITAL_COMMUNITY): Payer: Medicaid Other | Admitting: *Deleted

## 2018-10-23 ENCOUNTER — Ambulatory Visit (HOSPITAL_COMMUNITY)
Admission: RE | Admit: 2018-10-23 | Discharge: 2018-10-23 | Disposition: A | Discharge status: Home / Self Care | Payer: Medicaid Other | Source: Ambulatory Visit | Attending: Maternal & Fetal Medicine | Admitting: Maternal & Fetal Medicine

## 2018-10-23 ENCOUNTER — Other Ambulatory Visit: Payer: Self-pay

## 2018-10-23 DIAGNOSIS — O30043 Twin pregnancy, dichorionic/diamniotic, third trimester: Secondary | ICD-10-CM

## 2018-10-23 DIAGNOSIS — O99210 Obesity complicating pregnancy, unspecified trimester: Secondary | ICD-10-CM

## 2018-10-23 DIAGNOSIS — O0990 Supervision of high risk pregnancy, unspecified, unspecified trimester: Secondary | ICD-10-CM

## 2018-10-23 DIAGNOSIS — Z3A31 31 weeks gestation of pregnancy: Secondary | ICD-10-CM

## 2018-10-23 DIAGNOSIS — Z362 Encounter for other antenatal screening follow-up: Secondary | ICD-10-CM | POA: Diagnosis not present

## 2018-10-23 DIAGNOSIS — O30049 Twin pregnancy, dichorionic/diamniotic, unspecified trimester: Secondary | ICD-10-CM | POA: Diagnosis not present

## 2018-10-23 DIAGNOSIS — O99213 Obesity complicating pregnancy, third trimester: Secondary | ICD-10-CM | POA: Diagnosis not present

## 2018-10-24 ENCOUNTER — Other Ambulatory Visit (HOSPITAL_COMMUNITY): Payer: Self-pay | Admitting: *Deleted

## 2018-10-24 DIAGNOSIS — O99213 Obesity complicating pregnancy, third trimester: Secondary | ICD-10-CM

## 2018-11-01 ENCOUNTER — Ambulatory Visit (INDEPENDENT_AMBULATORY_CARE_PROVIDER_SITE_OTHER): Payer: Medicaid Other | Admitting: Family Medicine

## 2018-11-01 ENCOUNTER — Other Ambulatory Visit: Payer: Self-pay

## 2018-11-01 VITALS — BP 124/79 | HR 103 | Temp 98.2°F | Wt 307.2 lb

## 2018-11-01 DIAGNOSIS — O30043 Twin pregnancy, dichorionic/diamniotic, third trimester: Secondary | ICD-10-CM

## 2018-11-01 DIAGNOSIS — Z3A32 32 weeks gestation of pregnancy: Secondary | ICD-10-CM

## 2018-11-01 DIAGNOSIS — O99210 Obesity complicating pregnancy, unspecified trimester: Secondary | ICD-10-CM

## 2018-11-01 DIAGNOSIS — O0990 Supervision of high risk pregnancy, unspecified, unspecified trimester: Secondary | ICD-10-CM

## 2018-11-01 DIAGNOSIS — O99213 Obesity complicating pregnancy, third trimester: Secondary | ICD-10-CM

## 2018-11-01 NOTE — Progress Notes (Signed)
Pt baby scripts account changed to baby scripts optimization.  Pt reports she is able to get access to a cuff at home. Pt account manually changed so that pt could manually pt in BP.  Pt verbalized and demonstrated understanding of all teaching, including use of the app and how to enter BP.

## 2018-11-01 NOTE — Patient Instructions (Signed)

## 2018-11-01 NOTE — Progress Notes (Signed)
   PRENATAL VISIT NOTE  Subjective:  Autumn Duarte is a 23 y.o. G1P0 at [redacted]w[redacted]d being seen today for ongoing prenatal care.  She is currently monitored for the following issues for this high-risk pregnancy and has ECZEMA; PROBLEMS WITH HEARING; Supervision of high risk pregnancy, antepartum; Dichorionic diamniotic twin gestation; Morbid obesity with BMI of 50.0-59.9, adult (HCC); and Maternal morbid obesity, antepartum (HCC) on their problem list.  Patient reports no complaints.  Contractions: Irritability. Vag. Bleeding: None.  Movement: Present. Denies leaking of fluid.   The following portions of the patient's history were reviewed and updated as appropriate: allergies, current medications, past family history, past medical history, past social history, past surgical history and problem list.   Objective:   Vitals:   11/01/18 1537  BP: 124/79  Pulse: (!) 103  Temp: 98.2 F (36.8 C)  Weight: (!) 307 lb 3.2 oz (139.3 kg)    Fetal Status: Fetal Heart Rate (bpm): 131/157   Movement: Present     General:  Alert, oriented and cooperative. Patient is in no acute distress.  Skin: Skin is warm and dry. No rash noted.   Cardiovascular: Normal heart rate noted  Respiratory: Normal respiratory effort, no problems with respiration noted  Abdomen: Soft, gravid, appropriate for gestational age.  Pain/Pressure: Present     Pelvic: Cervical exam deferred        Extremities: Normal range of motion.  Edema: Mild pitting, slight indentation  Mental Status: Normal mood and affect. Normal behavior. Normal judgment and thought content.   Assessment and Plan:  Pregnancy: G1P0 at [redacted]w[redacted]d 1. Supervision of high risk pregnancy, antepartum Continue prenatal care.   2. Dichorionic diamniotic twin pregnancy in third trimester Concordant growth, vtx, vtx  3. Maternal morbid obesity, antepartum (HCC)   Preterm labor symptoms and general obstetric precautions including but not limited to vaginal bleeding,  contractions, leaking of fluid and fetal movement were reviewed in detail with the patient. Please refer to After Visit Summary for other counseling recommendations.   Return in 4 weeks (on 11/29/2018) for ob visit with 28 wk labs 2 wks video. With 36 wk labs  Future Appointments  Date Time Provider Department Center  11/28/2018  3:00 PM [facility] [place] 3 WH-M[facility]US M[facility]-US  11/29/2018  9:15 AM Adam Phenix, MD [facility]  11/29/2018  9:50 AM [facility]    Reva Bores, MD

## 2018-11-06 ENCOUNTER — Inpatient Hospital Stay (HOSPITAL_COMMUNITY)
Admission: AD | Admit: 2018-11-06 | Discharge: 2018-11-06 | Disposition: A | Discharge status: Home / Self Care | Payer: Medicaid Other | Attending: Family Medicine | Admitting: Family Medicine

## 2018-11-06 ENCOUNTER — Other Ambulatory Visit: Payer: Self-pay

## 2018-11-06 ENCOUNTER — Encounter (HOSPITAL_COMMUNITY): Payer: Self-pay | Admitting: *Deleted

## 2018-11-06 DIAGNOSIS — E669 Obesity, unspecified: Secondary | ICD-10-CM | POA: Diagnosis not present

## 2018-11-06 DIAGNOSIS — Z3A33 33 weeks gestation of pregnancy: Secondary | ICD-10-CM | POA: Diagnosis not present

## 2018-11-06 DIAGNOSIS — Z87891 Personal history of nicotine dependence: Secondary | ICD-10-CM | POA: Diagnosis not present

## 2018-11-06 DIAGNOSIS — O99213 Obesity complicating pregnancy, third trimester: Secondary | ICD-10-CM | POA: Insufficient documentation

## 2018-11-06 DIAGNOSIS — J45909 Unspecified asthma, uncomplicated: Secondary | ICD-10-CM | POA: Diagnosis not present

## 2018-11-06 DIAGNOSIS — Z803 Family history of malignant neoplasm of breast: Secondary | ICD-10-CM | POA: Diagnosis not present

## 2018-11-06 DIAGNOSIS — Z833 Family history of diabetes mellitus: Secondary | ICD-10-CM | POA: Diagnosis not present

## 2018-11-06 DIAGNOSIS — B373 Candidiasis of vulva and vagina: Secondary | ICD-10-CM | POA: Insufficient documentation

## 2018-11-06 DIAGNOSIS — O23593 Infection of other part of genital tract in pregnancy, third trimester: Secondary | ICD-10-CM | POA: Diagnosis not present

## 2018-11-06 DIAGNOSIS — O368130 Decreased fetal movements, third trimester, not applicable or unspecified: Secondary | ICD-10-CM | POA: Insufficient documentation

## 2018-11-06 DIAGNOSIS — O30043 Twin pregnancy, dichorionic/diamniotic, third trimester: Secondary | ICD-10-CM | POA: Insufficient documentation

## 2018-11-06 DIAGNOSIS — O99513 Diseases of the respiratory system complicating pregnancy, third trimester: Secondary | ICD-10-CM | POA: Diagnosis not present

## 2018-11-06 DIAGNOSIS — B3731 Acute candidiasis of vulva and vagina: Secondary | ICD-10-CM

## 2018-11-06 DIAGNOSIS — O368131 Decreased fetal movements, third trimester, fetus 1: Secondary | ICD-10-CM

## 2018-11-06 LAB — URINALYSIS, ROUTINE W REFLEX MICROSCOPIC
Bilirubin Urine: NEGATIVE
Glucose, UA: NEGATIVE mg/dL
Ketones, ur: NEGATIVE mg/dL
Nitrite: NEGATIVE
Protein, ur: NEGATIVE mg/dL
Specific Gravity, Urine: 1.017 (ref 1.005–1.030)
pH: 7 (ref 5.0–8.0)

## 2018-11-06 LAB — WET PREP, GENITAL
Clue Cells Wet Prep HPF POC: NONE SEEN
Sperm: NONE SEEN
Trich, Wet Prep: NONE SEEN

## 2018-11-06 MED ORDER — TERCONAZOLE 0.4 % VA CREA: 1.0000 | TOPICAL_CREAM | Freq: Every day | VAGINAL | 0 refills | Status: DC

## 2018-11-06 NOTE — MAU Provider Note (Addendum)
History     CSN: [id number]  Arrival date and time: 11/06/18 1739   First Provider Initiated Contact with Patient 11/06/18 1803      Chief Complaint  Patient presents with  . Decreased Fetal Movement   G1 @33 .1 wks with didi twins presenting with decreased FM. Reports no FM since 10 am today. Reports eating one meal today around noon. Has been drinking water. Also reports vaginal irritation and itching x3-4 days. No vaginal discharge or bleeding. No LOF or ctx.   OB History    Gravida  1   Para      Term      Preterm      AB      Living  0     SAB      TAB      Ectopic      Multiple      Live Births              Past Medical History:  Diagnosis Date  . Asthma   . Bronchitis   . Eczema   . Obesity   . Seasonal allergies     Past Surgical History:  Procedure Laterality Date  . WISDOM TOOTH EXTRACTION      Family History  Problem Relation Age of Onset  . Arthritis Mother   . Hepatitis Mother   . Thyroid disease Mother   . Diabetes Father   . Heart disease Father   . Hypertension Father   . Breast cancer Maternal Aunt   . Breast cancer Maternal Grandmother     Social History   Tobacco Use  . Smoking status: Former Smoker    Types: Cigarettes    Last attempt to quit: 03/19/2018    Years since quitting: 0.6  . Smokeless tobacco: Never Used  Substance Use Topics  . Alcohol use: Never    Frequency: Never  . Drug use: Never    Allergies: No Known Allergies  Medications Prior to Admission  Medication Sig Dispense Refill Last Dose  . albuterol (PROVENTIL HFA;VENTOLIN HFA) 108 (90 Base) MCG/ACT inhaler Inhale into the lungs.   11/06/2018 at Unknown time  . cyclobenzaprine (FLEXERIL) 10 MG tablet Take 1 tablet (10 mg total) by mouth 3 (three) times daily as needed for muscle spasms. 30 tablet 0 11/06/2018 at Unknown time  . folic acid (FOLVITE) 1 MG tablet Take 1 tablet (1 mg total) by mouth daily. 60 tablet 3 11/06/2018 at Unknown time  .  metoCLOPramide (REGLAN) 10 MG tablet Take 1 tablet (10 mg total) by mouth 4 (four) times daily. 120 tablet 1 11/06/2018 at Unknown time  . Prenatal Vit-Fe Fumarate-FA (PREPLUS) 27-1 MG TABS Take 1 tablet by mouth daily. 60 tablet 3 11/06/2018 at Unknown time  . aspirin EC 81 MG tablet Take 1 tablet (81 mg total) by mouth daily. Take after 12 weeks for prevention of preeclampsia later in pregnancy 300 tablet 2 Taking    Review of Systems  Gastrointestinal: Negative for abdominal pain.  Genitourinary: Positive for vaginal pain. Negative for vaginal bleeding and vaginal discharge.   Physical Exam   Blood pressure 139/83, pulse 90, temperature 98.6 F (37 C), resp. rate 16, last menstrual period 03/19/2018, SpO2 98 %.  Physical Exam  Constitutional: She is oriented to person, place, and time. She appears well-developed and well-nourished. No distress.  HENT:  Head: Normocephalic and atraumatic.  Neck: Normal range of motion.  Cardiovascular: Normal rate.  Respiratory: Effort normal. No respiratory distress.  GI: Soft. She exhibits no distension. There is no abdominal tenderness.  gravid  Genitourinary: There is no rash, tenderness, lesion or injury on the right labia. There is no rash, tenderness, lesion or injury on the left labia.    Genitourinary Comments: White clumpy discharge at introitus   Musculoskeletal: Normal range of motion.  Neurological: She is alert and oriented to person, place, and time.  Skin: Skin is warm and dry.  Psychiatric: She has a normal mood and affect.  EFM-A: 135 bpm, mod variability, + accels, no decels EFM-B: 155 bpm, mod variability, + accels, no decels Toco: rare  Results for orders placed or performed during the hospital encounter of 11/06/18 (from the past 24 hour(s))  Urinalysis, Routine w reflex microscopic     Status: Abnormal   Collection Time: 11/06/18  5:58 PM  Result Value Ref Range   Color, Urine YELLOW YELLOW   APPearance HAZY (A) CLEAR    Specific Gravity, Urine 1.017 1.005 - 1.030   pH 7.0 5.0 - 8.0   Glucose, UA NEGATIVE NEGATIVE mg/dL   Hgb urine dipstick SMALL (A) NEGATIVE   Bilirubin Urine NEGATIVE NEGATIVE   Ketones, ur NEGATIVE NEGATIVE mg/dL   Protein, ur NEGATIVE NEGATIVE mg/dL   Nitrite NEGATIVE NEGATIVE   Leukocytes,Ua MODERATE (A) NEGATIVE   RBC / HPF 6-10 0 - 5 RBC/hpf   WBC, UA 0-5 0 - 5 WBC/hpf   Bacteria, UA FEW (A) NONE SEEN   Squamous Epithelial / LPF 0-5 0 - 5   Mucus PRESENT   Wet prep, genital     Status: Abnormal   Collection Time: 11/06/18  6:13 PM  Result Value Ref Range   Yeast Wet Prep HPF POC PRESENT (A) NONE SEEN   Trich, Wet Prep NONE SEEN NONE SEEN   Clue Cells Wet Prep HPF POC NONE SEEN NONE SEEN   WBC, Wet Prep HPF POC MANY (A) NONE SEEN   Sperm NONE SEEN    MAU Course  Procedures  MDM Labs ordered and reviewed. Feeling good FM x2 since EFM applied, FM audible. NST reactive x2. Will treat yeast infection. Stable for discharge home.   Assessment and Plan   1. [redacted] weeks gestation of pregnancy   2. Dichorionic diamniotic twin pregnancy in third trimester   3. Decreased fetal movements in third trimester, fetus 1 of multiple gestation   4. Yeast vaginitis    [facility] Pelvic rest until infection cleared Rx Terazol  Allergies as of 11/06/2018   No Known Allergies     Medication List    TAKE these medications   albuterol 108 (90 Base) MCG/ACT inhaler Commonly known as:  PROVENTIL HFA;VENTOLIN HFA Inhale into the lungs.   aspirin EC 81 MG tablet Take 1 tablet (81 mg total) by mouth daily. Take after 12 weeks for prevention of preeclampsia later in pregnancy   cyclobenzaprine 10 MG tablet Commonly known as:  FLEXERIL Take 1 tablet (10 mg total) by mouth 3 (three) times daily as needed for muscle spasms.   folic acid 1 MG tablet Commonly known as:  FOLVITE Take 1 tablet (1 mg total) by mouth daily.   metoCLOPramide 10 MG  tablet Commonly known as:  REGLAN Take 1 tablet (10 mg total) by mouth 4 (four) times daily.   PrePLUS 27-1 MG Tabs Take 1 tablet by mouth daily.   terconazole 0.4 % vaginal cream Commonly known as:  TERAZOL 7 Place 1 applicator vaginally  at bedtime.      Donette Larry, CNM 11/06/2018, 6:34 PM

## 2018-11-06 NOTE — Discharge Instructions (Signed)
Fetal Movement Counts  Patient Name: ________________________________________________ Patient Due Date: ____________________  What is a fetal movement count?    A fetal movement count is the number of times that you feel your baby move during a certain amount of time. This may also be called a fetal kick count. A fetal movement count is recommended for every pregnant woman. You may be asked to start counting fetal movements as early as week 28 of your pregnancy.  Pay attention to when your baby is most active. You may notice your baby's sleep and wake cycles. You may also notice things that make your baby move more. You should do a fetal movement count:   When your baby is normally most active.   At the same time each day.  A good time to count movements is while you are resting, after having something to eat and drink.  How do I count fetal movements?  1. Find a quiet, comfortable area. Sit, or lie down on your side.  2. Write down the date, the start time and stop time, and the number of movements that you felt between those two times. Take this information with you to your health care visits.  3. For 2 hours, count kicks, flutters, swishes, rolls, and jabs. You should feel at least 10 movements during 2 hours.  4. You may stop counting after you have felt 10 movements.  5. If you do not feel 10 movements in 2 hours, have something to eat and drink. Then, keep resting and counting for 1 hour. If you feel at least 4 movements during that hour, you may stop counting.  Contact a health care provider if:   You feel fewer than 4 movements in 2 hours.   Your baby is not moving like he or she usually does.  Date: ____________ Start time: ____________ Stop time: ____________ Movements: ____________  Date: ____________ Start time: ____________ Stop time: ____________ Movements: ____________  Date: ____________ Start time: ____________ Stop time: ____________ Movements: ____________  Date: ____________ Start time:  ____________ Stop time: ____________ Movements: ____________  Date: ____________ Start time: ____________ Stop time: ____________ Movements: ____________  Date: ____________ Start time: ____________ Stop time: ____________ Movements: ____________  Date: ____________ Start time: ____________ Stop time: ____________ Movements: ____________  Date: ____________ Start time: ____________ Stop time: ____________ Movements: ____________  Date: ____________ Start time: ____________ Stop time: ____________ Movements: ____________  This information is not intended to replace advice given to you by your health care provider. Make sure you discuss any questions you have with your health care provider.  Document Released: 08/17/2006 Document Revised: 03/16/2016 Document Reviewed: 08/27/2015  Elsevier Interactive Patient Education  2019 [employer]  Vaginal Yeast infection, Adult    Vaginal yeast infection is a condition that causes vaginal discharge as well as soreness, swelling, and redness (inflammation) of the vagina. This is a common condition. Some women get this infection frequently.  What are the causes?  This condition is caused by a change in the normal balance of the yeast (candida) and bacteria that live in the vagina. This change causes an overgrowth of yeast, which causes the inflammation.  What increases the risk?  The condition is more likely to develop in women who:   Take antibiotic medicines.   Have diabetes.   Take birth control pills.   Are pregnant.   Douche often.   Have a weak body defense system (immune system).   Have been taking steroid medicines for a long time.     Frequently wear tight clothing.  What are the signs or symptoms?  Symptoms of this condition include:   White, thick, creamy vaginal discharge.   Swelling, itching, redness, and irritation of the vagina. The lips of the vagina (vulva) may be affected as well.   Pain or a burning feeling while urinating.   Pain during sex.  How  is this diagnosed?  This condition is diagnosed based on:   Your medical history.   A physical exam.   A pelvic exam. Your health care provider will examine a sample of your vaginal discharge under a microscope. Your health care provider may send this sample for testing to confirm the diagnosis.  How is this treated?  This condition is treated with medicine. Medicines may be over-the-counter or prescription. You may be told to use one or more of the following:   Medicine that is taken by mouth (orally).   Medicine that is applied as a cream (topically).   Medicine that is inserted directly into the vagina (suppository).  Follow these instructions at home:    Lifestyle   Do not have sex until your health care provider approves. Tell your sex partner that you have a yeast infection. That person should go to his or her health care provider and ask if they should also be treated.   Do not wear tight clothes, such as pantyhose or tight pants.   Wear breathable cotton underwear.  General instructions   Take or apply over-the-counter and prescription medicines only as told by your health care provider.   Eat more yogurt. This may help to keep your yeast infection from returning.   Do not use tampons until your health care provider approves.   Try taking a sitz bath to help with discomfort. This is a warm water bath that is taken while you are sitting down. The water should only come up to your hips and should cover your buttocks. Do this 3-4 times per day or as told by your health care provider.   Do not douche.   If you have diabetes, keep your blood sugar levels under control.   Keep all follow-up visits as told by your health care provider. This is important.  Contact a health care provider if:   You have a fever.   Your symptoms go away and then return.   Your symptoms do not get better with treatment.   Your symptoms get worse.   You have new symptoms.   You develop blisters in or around your  vagina.   You have blood coming from your vagina and it is not your menstrual period.   You develop pain in your abdomen.  Summary   Vaginal yeast infection is a condition that causes discharge as well as soreness, swelling, and redness (inflammation) of the vagina.   This condition is treated with medicine. Medicines may be over-the-counter or prescription.   Take or apply over-the-counter and prescription medicines only as told by your health care provider.   Do not douche. Do not have sex or use tampons until your health care provider approves.   Contact a health care provider if your symptoms do not get better with treatment or your symptoms go away and then return.  This information is not intended to replace advice given to you by your health care provider. Make sure you discuss any questions you have with your health care provider.  Document Released: 04/27/2005 Document Revised: 12/04/2017 Document Reviewed: 12/04/2017  Elsevier Interactive Patient

## 2018-11-06 NOTE — MAU Note (Signed)
Pt reports a decrease in fetal movement since this morning. Vaginal pain. Denies any vaginal bleeding.

## 2018-11-07 LAB — GC/CHLAMYDIA PROBE AMP (~~LOC~~) NOT AT ARMC
Chlamydia: NEGATIVE
Neisseria Gonorrhea: NEGATIVE

## 2018-11-10 ENCOUNTER — Other Ambulatory Visit: Payer: Self-pay

## 2018-11-10 ENCOUNTER — Inpatient Hospital Stay (HOSPITAL_COMMUNITY)
Admission: AD | Admit: 2018-11-10 | Discharge: 2018-11-10 | Disposition: A | Discharge status: Home / Self Care | Payer: Medicaid Other | Attending: Family Medicine | Admitting: Family Medicine

## 2018-11-10 ENCOUNTER — Encounter (HOSPITAL_COMMUNITY): Payer: Self-pay

## 2018-11-10 DIAGNOSIS — Z0371 Encounter for suspected problem with amniotic cavity and membrane ruled out: Secondary | ICD-10-CM | POA: Diagnosis not present

## 2018-11-10 DIAGNOSIS — Z803 Family history of malignant neoplasm of breast: Secondary | ICD-10-CM | POA: Diagnosis not present

## 2018-11-10 DIAGNOSIS — O42913 Preterm premature rupture of membranes, unspecified as to length of time between rupture and onset of labor, third trimester: Secondary | ICD-10-CM | POA: Diagnosis not present

## 2018-11-10 DIAGNOSIS — Z833 Family history of diabetes mellitus: Secondary | ICD-10-CM | POA: Diagnosis not present

## 2018-11-10 DIAGNOSIS — Z87891 Personal history of nicotine dependence: Secondary | ICD-10-CM | POA: Diagnosis not present

## 2018-11-10 DIAGNOSIS — E669 Obesity, unspecified: Secondary | ICD-10-CM | POA: Diagnosis not present

## 2018-11-10 DIAGNOSIS — Z3A33 33 weeks gestation of pregnancy: Secondary | ICD-10-CM | POA: Diagnosis not present

## 2018-11-10 DIAGNOSIS — J45909 Unspecified asthma, uncomplicated: Secondary | ICD-10-CM | POA: Insufficient documentation

## 2018-11-10 DIAGNOSIS — O99213 Obesity complicating pregnancy, third trimester: Secondary | ICD-10-CM | POA: Diagnosis not present

## 2018-11-10 DIAGNOSIS — O99513 Diseases of the respiratory system complicating pregnancy, third trimester: Secondary | ICD-10-CM | POA: Diagnosis not present

## 2018-11-10 DIAGNOSIS — O479 False labor, unspecified: Secondary | ICD-10-CM

## 2018-11-10 LAB — URINALYSIS, ROUTINE W REFLEX MICROSCOPIC
Bilirubin Urine: NEGATIVE
Glucose, UA: NEGATIVE mg/dL
Ketones, ur: 5 mg/dL — AB
Nitrite: NEGATIVE
Protein, ur: 30 mg/dL — AB
Specific Gravity, Urine: 1.023 (ref 1.005–1.030)
pH: 6 (ref 5.0–8.0)

## 2018-11-10 LAB — POCT FERN TEST: POCT Fern Test: NEGATIVE

## 2018-11-10 NOTE — MAU Note (Addendum)
Presents with possible PROM x 45 minutes ago after completion of urination; with occ reoccurrences of leakage thereafter. Pt states,"I feel a gush of fluid after I sat on toilet for a while when I finished urination". Denies cxts; however having cramps in lower abd 5/10. Denies vag bldg. Pos FM.  Adah Perl RN

## 2018-11-10 NOTE — MAU Provider Note (Signed)
Chief Complaint  Patient presents with  . Rupture of Membranes     First Provider Initiated Contact with Patient 11/10/18 2043      S: Autumn Duarte  is a 23 y.o. y.o. year old G1P0 female at [redacted]w[redacted]d weeks gestation who presents to MAU reporting leaking of light yellow fluid x 1 at 2000 right after urinating. No gushes since then. Also reports irreg cramping slightly worse this evening.   Di/di twins.   Contractions: Irreg, mild Vaginal bleeding: denies Fetal movement: Active x 2 Denies urinary complaints  Past Medical History:  Diagnosis Date  . Asthma   . Bronchitis   . Eczema   . Obesity   . Seasonal allergies    Past Surgical History:  Procedure Laterality Date  . WISDOM TOOTH EXTRACTION     Social History   Tobacco Use  . Smoking status: Former Smoker    Types: Cigarettes    Last attempt to quit: 03/19/2018    Years since quitting: 0.6  . Smokeless tobacco: Never Used  Substance Use Topics  . Alcohol use: Never    Frequency: Never  . Drug use: Never   Family History  Problem Relation Age of Onset  . Arthritis Mother   . Hepatitis Mother   . Thyroid disease Mother   . Diabetes Father   . Heart disease Father   . Hypertension Father   . Breast cancer Maternal Aunt   . Breast cancer Maternal Grandmother     O:  Patient Vitals for the past 24 hrs:  BP Temp Temp src Pulse Resp SpO2 Height Weight  11/10/18 2025 106/87 98.4 F (36.9 C) Oral (!) 117 16 98 % 5\' 3"  (1.6 m) (!) 142.4 kg   General: NAD Heart: Regular rate Lungs: Normal rate and effort Abd: Soft, NT, Gravid, S=D Pelvic: NEFG, Neg pooling, no blood.  Dilation: 1.5 Effacement (%): Thick Station: -3 Exam by:: [place] CNM  EFM: 140/150, Moderate variability, 15 x 15 accelerations, no decelerations Toco: Irreg, mild  Neg fern Results for orders placed or performed during the hospital encounter of 11/10/18 (from the past 24 hour(s))  Urinalysis, Routine w reflex microscopic     Status:  Abnormal   Collection Time: 11/10/18  9:05 PM  Result Value Ref Range   Color, Urine YELLOW YELLOW   APPearance CLEAR CLEAR   Specific Gravity, Urine 1.023 1.005 - 1.030   pH 6.0 5.0 - 8.0   Glucose, UA NEGATIVE NEGATIVE mg/dL   Hgb urine dipstick SMALL (A) NEGATIVE   Bilirubin Urine NEGATIVE NEGATIVE   Ketones, ur 5 (A) NEGATIVE mg/dL   Protein, ur 30 (A) NEGATIVE mg/dL   Nitrite NEGATIVE NEGATIVE   Leukocytes,Ua SMALL (A) NEGATIVE   RBC / HPF 21-50 0 - 5 RBC/hpf   WBC, UA 11-20 0 - 5 WBC/hpf   Bacteria, UA RARE (A) NONE SEEN   Squamous Epithelial / LPF 0-5 0 - 5   Mucus PRESENT   POCT fern test     Status: None   Collection Time: 11/10/18  9:41 PM  Result Value Ref Range   POCT Fern Test Negative = intact amniotic membranes      No cervical change throughout MAU visit.  No evidence of SROM or preterm labor.   A: [redacted]w[redacted]d week IUP No evidence of SROM or preterm labor.  FHR reactive x 2  P: Discharge home in stable condition. Preterm Labor precautions and fetal kick counts. Urine culture pending Follow-up as scheduled  for prenatal visit or sooner as needed if symptoms worsen. Return to maternity admissions as needed if symptoms worsen.  Katrinka BlazingSmith, [place], [place] 11/10/2018 9:57 PM  2

## 2018-11-12 LAB — CULTURE, OB URINE
Culture: NO GROWTH
Special Requests: NORMAL

## 2018-11-20 ENCOUNTER — Encounter: Payer: Self-pay | Admitting: General Practice

## 2018-11-22 ENCOUNTER — Telehealth: Payer: Self-pay | Admitting: *Deleted

## 2018-11-22 ENCOUNTER — Telehealth: Payer: Self-pay | Admitting: Obstetrics & Gynecology

## 2018-11-22 NOTE — Telephone Encounter (Signed)
The patient called back due to a missed phone call. Informed the patient of the call from a nurse due to her reported blood pressure. Informed I would send a message to the nurse however the patient stated the nurse messaged her on mychart. She will respond, accordingly.

## 2018-11-22 NOTE — Telephone Encounter (Signed)
Received a call from [employer] regarding a critical BP of 183/103 for the pt.  Attempted to contact the pt via telephone but the pt did not pick up.  Left a message requesting the pt call the clinic.  MyChart message sent.

## 2018-11-23 ENCOUNTER — Encounter (HOSPITAL_COMMUNITY): Payer: Self-pay

## 2018-11-23 ENCOUNTER — Inpatient Hospital Stay (HOSPITAL_COMMUNITY)
Admission: AD | Admit: 2018-11-23 | Discharge: 2018-11-23 | Disposition: A | Discharge status: Home / Self Care | Payer: Medicaid Other | Attending: Obstetrics & Gynecology | Admitting: Obstetrics & Gynecology

## 2018-11-23 ENCOUNTER — Other Ambulatory Visit: Payer: Self-pay

## 2018-11-23 DIAGNOSIS — O163 Unspecified maternal hypertension, third trimester: Secondary | ICD-10-CM | POA: Insufficient documentation

## 2018-11-23 DIAGNOSIS — R51 Headache: Secondary | ICD-10-CM | POA: Diagnosis not present

## 2018-11-23 DIAGNOSIS — O30043 Twin pregnancy, dichorionic/diamniotic, third trimester: Secondary | ICD-10-CM

## 2018-11-23 DIAGNOSIS — O0990 Supervision of high risk pregnancy, unspecified, unspecified trimester: Secondary | ICD-10-CM

## 2018-11-23 DIAGNOSIS — O9989 Other specified diseases and conditions complicating pregnancy, childbirth and the puerperium: Secondary | ICD-10-CM | POA: Diagnosis not present

## 2018-11-23 DIAGNOSIS — Z3689 Encounter for other specified antenatal screening: Secondary | ICD-10-CM

## 2018-11-23 DIAGNOSIS — Z3A35 35 weeks gestation of pregnancy: Secondary | ICD-10-CM | POA: Diagnosis not present

## 2018-11-23 DIAGNOSIS — G44201 Tension-type headache, unspecified, intractable: Secondary | ICD-10-CM

## 2018-11-23 DIAGNOSIS — O133 Gestational [pregnancy-induced] hypertension without significant proteinuria, third trimester: Secondary | ICD-10-CM

## 2018-11-23 DIAGNOSIS — O99210 Obesity complicating pregnancy, unspecified trimester: Secondary | ICD-10-CM

## 2018-11-23 LAB — URINALYSIS, ROUTINE W REFLEX MICROSCOPIC
Bilirubin Urine: NEGATIVE
Glucose, UA: NEGATIVE mg/dL
Ketones, ur: NEGATIVE mg/dL
Nitrite: NEGATIVE
Protein, ur: NEGATIVE mg/dL
Specific Gravity, Urine: 1.023 (ref 1.005–1.030)
WBC, UA: >50 WBC/hpf — ABNORMAL HIGH (ref 0–5)
pH: 6 (ref 5.0–8.0)

## 2018-11-23 LAB — CBC
HCT: 30.5 % — ABNORMAL LOW (ref 36.0–46.0)
Hemoglobin: 10.3 g/dL — ABNORMAL LOW (ref 12.0–15.0)
MCH: 28.9 pg (ref 26.0–34.0)
MCHC: 33.8 g/dL (ref 30.0–36.0)
MCV: 85.4 fL (ref 80.0–100.0)
Platelets: 265 10*3/uL (ref 150–400)
RBC: 3.57 MIL/uL — ABNORMAL LOW (ref 3.87–5.11)
RDW: 13.2 % (ref 11.5–15.5)
WBC: 7.9 10*3/uL (ref 4.0–10.5)
nRBC: 0 % (ref 0.0–0.2)

## 2018-11-23 LAB — COMPREHENSIVE METABOLIC PANEL
ALT: 13 U/L (ref 0–44)
AST: 17 U/L (ref 15–41)
Albumin: 2.4 g/dL — ABNORMAL LOW (ref 3.5–5.0)
Alkaline Phosphatase: 110 U/L (ref 38–126)
Anion gap: 6 (ref 5–15)
BUN: 8 mg/dL (ref 6–20)
CO2: 23 mmol/L (ref 22–32)
Calcium: 8.6 mg/dL — ABNORMAL LOW (ref 8.9–10.3)
Chloride: 110 mmol/L (ref 98–111)
Creatinine, Ser: 0.69 mg/dL (ref 0.44–1.00)
GFR calc Af Amer: >60 mL/min (ref >60)
GFR calc non Af Amer: >60 mL/min (ref >60)
Glucose, Bld: 93 mg/dL (ref 70–99)
Potassium: 4 mmol/L (ref 3.5–5.1)
Sodium: 139 mmol/L (ref 135–145)
Total Bilirubin: 0.4 mg/dL (ref 0.3–1.2)
Total Protein: 5.9 g/dL — ABNORMAL LOW (ref 6.5–8.1)

## 2018-11-23 LAB — PROTEIN / CREATININE RATIO, URINE
Creatinine, Urine: 169.51 mg/dL
Protein Creatinine Ratio: 0.18 mg/mg{Cre} — ABNORMAL HIGH (ref 0.00–0.15)
Total Protein, Urine: 31 mg/dL

## 2018-11-23 MED ORDER — ACETAMINOPHEN 500 MG PO TABS
1000.0000 mg | ORAL_TABLET | Freq: Once | ORAL | Status: AC
  Administered 2018-11-23: 21:00:00 1000 mg via ORAL
  Filled 2018-11-23: qty 2

## 2018-11-23 NOTE — MAU Note (Signed)
My b/p yesterday was 183/103 and later was 132/80. Today was 132//92. Doctor told me to come yesterday but I fell asleep before I had a ride to hospital. Today called after hrs nurse and told to come in. Head hurts temples, more on R. No other symptoms. Did not take any meds. Denies LOF or bleeding

## 2018-11-23 NOTE — MAU Provider Note (Signed)
Chief Complaint  Patient presents with  . Hypertension  . Headache    First Provider Initiated Contact with Patient 11/23/18 2028     S: Autumn Duarte  is a 23 y.o. y.o. year old G1P0 female at [redacted]w[redacted]d weeks gestation who presents to MAU with elevated blood pressures. She reports yesterday her BP 183/103 then repeat was 132/80. She denies hx of hypertension. Was told to present to MAU yesterday but fell asleep prior to fiance getting home to take her.  Current blood pressure medication: none   She reports hx of HA, prior to getting pregnant, was not on any medication usually take Tylenol for HA with relief. Has not taken any medication today due to running out of Tylenol at home.   Associated symptoms: Headache, Denies vision changes, Denies epigastric pain Contractions: Denies Vaginal bleeding: None  Fetal movement: Present   O:  Patient Vitals for the past 24 hrs:  BP Temp Pulse Resp Height Weight  11/23/18 2146 132/83 - 91 - - -  11/23/18 2131 114/90 - 100 - - -  11/23/18 2116 121/90 - 97 - - -  11/23/18 2101 125/77 - 100 - - -  11/23/18 2046 118/67 - 98 - - -  11/23/18 2029 120/67 - (!) 106 - - -  11/23/18 2002 123/73 - (!) 107 - - -  11/23/18 1958 - 98.5 F (36.9 C) - 18 5\' 3"  (1.6 m) (!) 144.7 kg   General: NAD Heart: Regular rate Lungs: Normal rate and effort Abd: Soft, NT, Gravid, S>D Extremities: No Pedal edema Neuro: 2+ deep tendon reflexes, No clonus Pelvic: deferred     FHR A: 135/moderate/ +accels/ no decelerations  FHR B: 145/ moderate/ +accels/ no decelerations  Toco: no UC   Results for orders placed or performed during the hospital encounter of 11/23/18 (from the past 24 hour(s))  CBC     Status: Abnormal   Collection Time: 11/23/18  8:29 PM  Result Value Ref Range   WBC 7.9 4.0 - 10.5 K/uL   RBC 3.57 (L) 3.87 - 5.11 MIL/uL   Hemoglobin 10.3 (L) 12.0 - 15.0 g/dL   HCT 39.0 (L) 30.0 - 92.3 %   MCV 85.4 80.0 - 100.0 fL   MCH 28.9 26.0 - 34.0 pg   MCHC  33.8 30.0 - 36.0 g/dL   RDW 30.0 76.2 - 26.3 %   Platelets 265 150 - 400 K/uL   nRBC 0.0 0.0 - 0.2 %  Comprehensive metabolic panel     Status: Abnormal   Collection Time: 11/23/18  8:29 PM  Result Value Ref Range   Sodium 139 135 - 145 mmol/L   Potassium 4.0 3.5 - 5.1 mmol/L   Chloride 110 98 - 111 mmol/L   CO2 23 22 - 32 mmol/L   Glucose, Bld 93 70 - 99 mg/dL   BUN 8 6 - 20 mg/dL   Creatinine, Ser 3.35 0.44 - 1.00 mg/dL   Calcium 8.6 (L) 8.9 - 10.3 mg/dL   Total Protein 5.9 (L) 6.5 - 8.1 g/dL   Albumin 2.4 (L) 3.5 - 5.0 g/dL   AST 17 15 - 41 U/L   ALT 13 0 - 44 U/L   Alkaline Phosphatase 110 38 - 126 U/L   Total Bilirubin 0.4 0.3 - 1.2 mg/dL   GFR calc non Af Amer >60 >60 mL/min   GFR calc Af Amer >60 >60 mL/min   Anion gap 6 5 - 15  Urinalysis, Routine w reflex  microscopic     Status: Abnormal   Collection Time: 11/23/18  8:45 PM  Result Value Ref Range   Color, Urine YELLOW YELLOW   APPearance CLOUDY (A) CLEAR   Specific Gravity, Urine 1.023 1.005 - 1.030   pH 6.0 5.0 - 8.0   Glucose, UA NEGATIVE NEGATIVE mg/dL   Hgb urine dipstick SMALL (A) NEGATIVE   Bilirubin Urine NEGATIVE NEGATIVE   Ketones, ur NEGATIVE NEGATIVE mg/dL   Protein, ur NEGATIVE NEGATIVE mg/dL   Nitrite NEGATIVE NEGATIVE   Leukocytes,Ua LARGE (A) NEGATIVE   RBC / HPF 21-50 0 - 5 RBC/hpf   WBC, UA >50 (H) 0 - 5 WBC/hpf   Bacteria, UA FEW (A) NONE SEEN   Squamous Epithelial / LPF 21-50 0 - 5   Mucus PRESENT   Protein / creatinine ratio, urine     Status: Abnormal   Collection Time: 11/23/18  8:45 PM  Result Value Ref Range   Creatinine, Urine 169.51 mg/dL   Total Protein, Urine 31 mg/dL   Protein Creatinine Ratio 0.18 (H) 0.00 - 0.15 mg/mg[Cre]   Normotensive BP in MAU. PEC labs negative. Treatments in MAU included 1000mg  Tylenol for HA. Patient reports HA is completely resolved after treatment. Continues to deny vision changes or epigastric pain.   Educated and discussed with patient proper way  to take BP at home, reports she is using wrist cuff, sitting down with hand in air. Discussed sitting down prior to taking BP, placing arm over chest or on table. Patient verbalizes understanding.   A: 6547w4d week IUP Transient Hypertension FHRx2 reactive  P: Discharge home in stable condition  Preeclampsia precautions. Return to maternity admissions as needed in emergencies Follow up as scheduled in the office on 4/29 Continue to monitor BP closer through babyscripts   Sharyon CableRogers, Laurabelle Gorczyca C, CNM 11/23/2018 10:10 PM

## 2018-11-25 LAB — CULTURE, OB URINE

## 2018-11-26 NOTE — Progress Notes (Signed)
Received BP Alert from [facility] of 132/92.

## 2018-11-28 ENCOUNTER — Ambulatory Visit (HOSPITAL_COMMUNITY): Payer: Medicaid Other | Admitting: *Deleted

## 2018-11-28 ENCOUNTER — Ambulatory Visit (HOSPITAL_COMMUNITY)
Admission: RE | Admit: 2018-11-28 | Discharge: 2018-11-28 | Disposition: A | Discharge status: Home / Self Care | Payer: Medicaid Other | Source: Ambulatory Visit | Attending: Obstetrics and Gynecology | Admitting: Obstetrics and Gynecology

## 2018-11-28 ENCOUNTER — Other Ambulatory Visit: Payer: Self-pay | Admitting: Advanced Practice Midwife

## 2018-11-28 ENCOUNTER — Telehealth: Payer: Self-pay | Admitting: Obstetrics and Gynecology

## 2018-11-28 ENCOUNTER — Encounter (HOSPITAL_COMMUNITY): Payer: Self-pay

## 2018-11-28 ENCOUNTER — Other Ambulatory Visit: Payer: Self-pay

## 2018-11-28 VITALS — BP 140/98 | HR 113 | Temp 99.0°F

## 2018-11-28 DIAGNOSIS — O0990 Supervision of high risk pregnancy, unspecified, unspecified trimester: Secondary | ICD-10-CM | POA: Insufficient documentation

## 2018-11-28 DIAGNOSIS — O99213 Obesity complicating pregnancy, third trimester: Secondary | ICD-10-CM | POA: Diagnosis not present

## 2018-11-28 DIAGNOSIS — O30049 Twin pregnancy, dichorionic/diamniotic, unspecified trimester: Secondary | ICD-10-CM | POA: Diagnosis present

## 2018-11-28 DIAGNOSIS — O30043 Twin pregnancy, dichorionic/diamniotic, third trimester: Secondary | ICD-10-CM | POA: Diagnosis not present

## 2018-11-28 DIAGNOSIS — Z3A36 36 weeks gestation of pregnancy: Secondary | ICD-10-CM | POA: Diagnosis not present

## 2018-11-28 DIAGNOSIS — O99210 Obesity complicating pregnancy, unspecified trimester: Secondary | ICD-10-CM | POA: Diagnosis present

## 2018-11-28 NOTE — Telephone Encounter (Signed)
Spoke w/ Dr. Grace Bushy (MFM), bp mildly elevated today, concordant di di twins, has had several elevated values this pregnancy as well, sufficient to make dx of ghtn. Asymptomatic today, had negative preE eval on 4/24. No current plan for induction. Dr. Grace Bushy and I agreed given dx ghtn iol indicated @ 37 wks, scheduled today for 5/4 7:30 AM when pt will be 37+0. L & D to contact patient to inform.

## 2018-11-29 ENCOUNTER — Telehealth (HOSPITAL_COMMUNITY): Payer: Self-pay | Admitting: *Deleted

## 2018-11-29 ENCOUNTER — Other Ambulatory Visit: Payer: Medicaid Other

## 2018-11-29 ENCOUNTER — Ambulatory Visit (INDEPENDENT_AMBULATORY_CARE_PROVIDER_SITE_OTHER): Payer: Medicaid Other | Admitting: Obstetrics & Gynecology

## 2018-11-29 VITALS — BP 112/81 | HR 108 | Temp 98.1°F | Wt 317.4 lb

## 2018-11-29 DIAGNOSIS — O0990 Supervision of high risk pregnancy, unspecified, unspecified trimester: Secondary | ICD-10-CM

## 2018-11-29 DIAGNOSIS — O30043 Twin pregnancy, dichorionic/diamniotic, third trimester: Secondary | ICD-10-CM

## 2018-11-29 DIAGNOSIS — O133 Gestational [pregnancy-induced] hypertension without significant proteinuria, third trimester: Secondary | ICD-10-CM

## 2018-11-29 DIAGNOSIS — Z3A36 36 weeks gestation of pregnancy: Secondary | ICD-10-CM

## 2018-11-29 LAB — OB RESULTS CONSOLE GBS: GBS: NEGATIVE

## 2018-11-29 NOTE — Telephone Encounter (Signed)
Preadmission screen  

## 2018-11-29 NOTE — Patient Instructions (Signed)
Labor Induction    Labor induction is when steps are taken to cause a pregnant woman to begin the labor process. Most women go into labor on their own between 37 weeks and 42 weeks of pregnancy. When this does not happen or when there is a medical need for labor to begin, steps may be taken to induce labor. Labor induction causes a pregnant woman's uterus to contract. It also causes the cervix to soften (ripen), open (dilate), and thin out (efface). Usually, labor is not induced before 39 weeks of pregnancy unless there is a medical reason to do so. Your health care provider will determine if labor induction is needed.  Before inducing labor, your health care provider will consider a number of factors, including:  · Your medical condition and your baby's.  · How many weeks along you are in your pregnancy.  · How mature your baby's lungs are.  · The condition of your cervix.  · The position of your baby.  · The size of your birth canal.  What are some reasons for labor induction?  Labor may be induced if:  · Your health or your baby's health is at risk.  · Your pregnancy is overdue by 1 week or more.  · Your water breaks but labor does not start on its own.  · There is a low amount of amniotic fluid around your baby.  You may also choose (elect) to have labor induced at a certain time. Generally, elective labor induction is done no earlier than 39 weeks of pregnancy.  What methods are used for labor induction?  Methods used for labor induction include:  · Prostaglandin medicine. This medicine starts contractions and causes the cervix to dilate and ripen. It can be taken by mouth (orally) or by being inserted into the vagina (suppository).  · Inserting a small, thin tube (catheter) with a balloon into the vagina and then expanding the balloon with water to dilate the cervix.  · Stripping the membranes. In this method, your health care provider gently separates amniotic sac tissue from the cervix. This causes the  cervix to stretch, which in turn causes the release of a hormone called progesterone. The hormone causes the uterus to contract. This procedure is often done during an office visit, after which you will be sent home to wait for contractions to begin.  · Breaking the water. In this method, your health care provider uses a small instrument to make a small hole in the amniotic sac. This eventually causes the amniotic sac to break. Contractions should begin after a few hours.  · Medicine to trigger or strengthen contractions. This medicine is given through an IV that is inserted into a vein in your arm.  Except for membrane stripping, which can be done in a clinic, labor induction is done in the hospital so that you and your baby can be carefully monitored.  How long does it take for labor to be induced?  The length of time it takes to induce labor depends on how ready your body is for labor. Some inductions can take up to 2-3 days, while others may take less than a day. Induction may take longer if:  · You are induced early in your pregnancy.  · It is your first pregnancy.  · Your cervix is not ready.  What are some risks associated with labor induction?  Some risks associated with labor induction include:  · Changes in fetal heart rate, such as being too   high, too low, or irregular (erratic).  · Failed induction.  · Infection in the mother or the baby.  · Increased risk of having a cesarean delivery.  · Fetal death.  · Breaking off (abruption) of the placenta from the uterus (rare).  · Rupture of the uterus (very rare).  When induction is needed for medical reasons, the benefits of induction generally outweigh the risks.  What are some reasons for not inducing labor?  Labor induction should not be done if:  · Your baby does not tolerate contractions.  · You have had previous surgeries on your uterus, such as a myomectomy, removal of fibroids, or a vertical scar from a previous cesarean delivery.  · Your placenta lies  very low in your uterus and blocks the opening of the cervix (placenta previa).  · Your baby is not in a head-down position.  · The umbilical cord drops down into the birth canal in front of the baby.  · There are unusual circumstances, such as the baby being very early (premature).  · You have had more than 2 previous cesarean deliveries.  Summary  · Labor induction is when steps are taken to cause a pregnant woman to begin the labor process.  · Labor induction causes a pregnant woman's uterus to contract. It also causes the cervix to ripen, dilate, and efface.  · Labor is not induced before 39 weeks of pregnancy unless there is a medical reason to do so.  · When induction is needed for medical reasons, the benefits of induction generally outweigh the risks.  This information is not intended to replace advice given to you by your health care provider. Make sure you discuss any questions you have with your health care provider.  Document Released: 12/07/2006 Document Revised: 08/31/2016 Document Reviewed: 08/31/2016  Elsevier Interactive Patient Education © 2019 [employer]

## 2018-11-29 NOTE — Progress Notes (Signed)
   PRENATAL VISIT NOTE  Subjective:  Autumn Duarte is a 23 y.o. G1P0 at [redacted]w[redacted]d being seen today for ongoing prenatal care.  She is currently monitored for the following issues for this high-risk pregnancy and has ECZEMA; PROBLEMS WITH HEARING; Supervision of high risk pregnancy, antepartum; Dichorionic diamniotic twin gestation; Morbid obesity with BMI of 50.0-59.9, adult (HCC); Maternal morbid obesity, antepartum (HCC); and Gestational hypertension, third trimester on their problem list.  Patient reports no complaints.  Contractions: Irritability. Vag. Bleeding: None.  Movement: Present. Denies leaking of fluid.   The following portions of the patient's history were reviewed and updated as appropriate: allergies, current medications, past family history, past medical history, past social history, past surgical history and problem list.   Objective:   Vitals:   11/29/18 0953  BP: 112/81  Pulse: (!) 108  Temp: 98.1 F (36.7 C)  Weight: (!) 317 lb 6.4 oz (144 kg)    Fetal Status: Fetal Heart Rate (bpm): 150/134   Movement: Present     General:  Alert, oriented and cooperative. Patient is in no acute distress.  Skin: Skin is warm and dry. No rash noted.   Cardiovascular: Normal heart rate noted  Respiratory: Normal respiratory effort, no problems with respiration noted  Abdomen: Soft, gravid, appropriate for gestational age.  Pain/Pressure: Present     Pelvic: Cervical exam deferred        Extremities: Normal range of motion.  Edema: None  Mental Status: Normal mood and affect. Normal behavior. Normal judgment and thought content.   Assessment and Plan:  Pregnancy: G1P0 at [redacted]w[redacted]d 1. Supervision of high risk pregnancy, antepartum IOL 37 weeks  2. Dichorionic diamniotic twin pregnancy in third trimester Normal growth  3. Gestational hypertension, third trimester Normal BP today  Preterm labor symptoms and general obstetric precautions including but not limited to vaginal bleeding,  contractions, leaking of fluid and fetal movement were reviewed in detail with the patient. Please refer to After Visit Summary for other counseling recommendations.   Return in about 4 weeks (around 12/27/2018) for postpartum.  Future Appointments  Date Time Provider Department Center  12/03/2018  7:30 AM [facility] SCHED ROOM [facility] None    Scheryl Darter, MD

## 2018-11-30 ENCOUNTER — Other Ambulatory Visit: Payer: Self-pay | Admitting: Advanced Practice Midwife

## 2018-12-03 ENCOUNTER — Encounter (HOSPITAL_COMMUNITY): Payer: Self-pay

## 2018-12-03 ENCOUNTER — Inpatient Hospital Stay (HOSPITAL_COMMUNITY): Payer: Medicaid Other

## 2018-12-03 ENCOUNTER — Inpatient Hospital Stay (HOSPITAL_COMMUNITY): Payer: Medicaid Other | Admitting: Anesthesiology

## 2018-12-03 ENCOUNTER — Other Ambulatory Visit: Payer: Self-pay

## 2018-12-03 ENCOUNTER — Inpatient Hospital Stay (HOSPITAL_COMMUNITY)
Admission: AD | Admit: 2018-12-03 | Discharge: 2018-12-06 | DRG: 807 | Disposition: A | Discharge status: Home / Self Care | Payer: Medicaid Other | Attending: Obstetrics & Gynecology | Admitting: Obstetrics & Gynecology

## 2018-12-03 DIAGNOSIS — D649 Anemia, unspecified: Secondary | ICD-10-CM | POA: Diagnosis present

## 2018-12-03 DIAGNOSIS — Z87891 Personal history of nicotine dependence: Secondary | ICD-10-CM

## 2018-12-03 DIAGNOSIS — O30043 Twin pregnancy, dichorionic/diamniotic, third trimester: Secondary | ICD-10-CM | POA: Diagnosis present

## 2018-12-03 DIAGNOSIS — Z7982 Long term (current) use of aspirin: Secondary | ICD-10-CM | POA: Diagnosis not present

## 2018-12-03 DIAGNOSIS — O134 Gestational [pregnancy-induced] hypertension without significant proteinuria, complicating childbirth: Secondary | ICD-10-CM | POA: Diagnosis present

## 2018-12-03 DIAGNOSIS — O30049 Twin pregnancy, dichorionic/diamniotic, unspecified trimester: Secondary | ICD-10-CM | POA: Diagnosis present

## 2018-12-03 DIAGNOSIS — Z3A37 37 weeks gestation of pregnancy: Secondary | ICD-10-CM

## 2018-12-03 DIAGNOSIS — O10919 Unspecified pre-existing hypertension complicating pregnancy, unspecified trimester: Secondary | ICD-10-CM | POA: Diagnosis present

## 2018-12-03 DIAGNOSIS — O99214 Obesity complicating childbirth: Secondary | ICD-10-CM | POA: Diagnosis present

## 2018-12-03 DIAGNOSIS — O0990 Supervision of high risk pregnancy, unspecified, unspecified trimester: Secondary | ICD-10-CM

## 2018-12-03 DIAGNOSIS — O139 Gestational [pregnancy-induced] hypertension without significant proteinuria, unspecified trimester: Secondary | ICD-10-CM | POA: Diagnosis present

## 2018-12-03 DIAGNOSIS — O9902 Anemia complicating childbirth: Secondary | ICD-10-CM | POA: Diagnosis present

## 2018-12-03 DIAGNOSIS — O133 Gestational [pregnancy-induced] hypertension without significant proteinuria, third trimester: Secondary | ICD-10-CM

## 2018-12-03 DIAGNOSIS — J45909 Unspecified asthma, uncomplicated: Secondary | ICD-10-CM | POA: Diagnosis present

## 2018-12-03 DIAGNOSIS — O9952 Diseases of the respiratory system complicating childbirth: Secondary | ICD-10-CM | POA: Diagnosis present

## 2018-12-03 DIAGNOSIS — O99210 Obesity complicating pregnancy, unspecified trimester: Secondary | ICD-10-CM

## 2018-12-03 DIAGNOSIS — O30009 Twin pregnancy, unspecified number of placenta and unspecified number of amniotic sacs, unspecified trimester: Secondary | ICD-10-CM

## 2018-12-03 LAB — COMPREHENSIVE METABOLIC PANEL
ALT: 15 U/L (ref 0–44)
AST: 17 U/L (ref 15–41)
Albumin: 2.3 g/dL — ABNORMAL LOW (ref 3.5–5.0)
Alkaline Phosphatase: 110 U/L (ref 38–126)
Anion gap: 7 (ref 5–15)
BUN: 7 mg/dL (ref 6–20)
CO2: 22 mmol/L (ref 22–32)
Calcium: 8.6 mg/dL — ABNORMAL LOW (ref 8.9–10.3)
Chloride: 109 mmol/L (ref 98–111)
Creatinine, Ser: 0.69 mg/dL (ref 0.44–1.00)
GFR calc Af Amer: >60 mL/min (ref >60)
GFR calc non Af Amer: >60 mL/min (ref >60)
Glucose, Bld: 93 mg/dL (ref 70–99)
Potassium: 3.7 mmol/L (ref 3.5–5.1)
Sodium: 138 mmol/L (ref 135–145)
Total Bilirubin: 0.5 mg/dL (ref 0.3–1.2)
Total Protein: 5.9 g/dL — ABNORMAL LOW (ref 6.5–8.1)

## 2018-12-03 LAB — CULTURE, BETA STREP (GROUP B ONLY): Strep Gp B Culture: NEGATIVE

## 2018-12-03 LAB — CBC
HCT: 30.2 % — ABNORMAL LOW (ref 36.0–46.0)
Hemoglobin: 10.1 g/dL — ABNORMAL LOW (ref 12.0–15.0)
MCH: 28.3 pg (ref 26.0–34.0)
MCHC: 33.4 g/dL (ref 30.0–36.0)
MCV: 84.6 fL (ref 80.0–100.0)
Platelets: 256 10*3/uL (ref 150–400)
RBC: 3.57 MIL/uL — ABNORMAL LOW (ref 3.87–5.11)
RDW: 13.4 % (ref 11.5–15.5)
WBC: 8.7 10*3/uL (ref 4.0–10.5)
nRBC: 0 % (ref 0.0–0.2)

## 2018-12-03 LAB — RPR: RPR Ser Ql: NONREACTIVE

## 2018-12-03 LAB — HEPATITIS B SURFACE ANTIGEN: Hepatitis B Surface Ag: NEGATIVE

## 2018-12-03 LAB — ABO/RH: ABO/RH(D): O POS

## 2018-12-03 LAB — PROTEIN / CREATININE RATIO, URINE
Creatinine, Urine: 110.02 mg/dL
Protein Creatinine Ratio: 0.14 mg/mg{Cre} (ref 0.00–0.15)
Total Protein, Urine: 15 mg/dL

## 2018-12-03 MED ORDER — EPHEDRINE 5 MG/ML INJ: 10.0000 mg | INTRAVENOUS | Status: DC | PRN

## 2018-12-03 MED ORDER — SODIUM CHLORIDE (PF) 0.9 % IJ SOLN
INTRAMUSCULAR | Status: DC | PRN
  Administered 2018-12-03: 14 mL/h via EPIDURAL

## 2018-12-03 MED ORDER — PHENYLEPHRINE 40 MCG/ML (10ML) SYRINGE FOR IV PUSH (FOR BLOOD PRESSURE SUPPORT): 80.0000 ug | PREFILLED_SYRINGE | INTRAVENOUS | Status: DC | PRN

## 2018-12-03 MED ORDER — LIDOCAINE HCL (PF) 1 % IJ SOLN: 30.0000 mL | INTRAMUSCULAR | Status: DC | PRN

## 2018-12-03 MED ORDER — PHENYLEPHRINE 40 MCG/ML (10ML) SYRINGE FOR IV PUSH (FOR BLOOD PRESSURE SUPPORT)
80.0000 ug | PREFILLED_SYRINGE | INTRAVENOUS | Status: DC | PRN
  Filled 2018-12-03: qty 10

## 2018-12-03 MED ORDER — ACETAMINOPHEN 325 MG PO TABS: 650.0000 mg | ORAL_TABLET | ORAL | Status: DC | PRN

## 2018-12-03 MED ORDER — NALBUPHINE HCL 10 MG/ML IJ SOLN
5.0000 mg | Freq: Once | INTRAMUSCULAR | Status: AC | PRN
  Administered 2018-12-03: 5 mg via INTRAVENOUS
  Filled 2018-12-03: qty 1

## 2018-12-03 MED ORDER — FENTANYL CITRATE (PF) 100 MCG/2ML IJ SOLN: 100.0000 ug | INTRAMUSCULAR | Status: DC | PRN

## 2018-12-03 MED ORDER — FENTANYL-BUPIVACAINE-NACL 0.5-0.125-0.9 MG/250ML-% EP SOLN
12.0000 mL/h | EPIDURAL | Status: DC | PRN
  Filled 2018-12-03: qty 250

## 2018-12-03 MED ORDER — OXYTOCIN 40 UNITS IN NORMAL SALINE INFUSION - SIMPLE MED
2.5000 [IU]/h | INTRAVENOUS | Status: DC
  Filled 2018-12-03: qty 1000

## 2018-12-03 MED ORDER — OXYTOCIN 40 UNITS IN NORMAL SALINE INFUSION - SIMPLE MED
1.0000 m[IU]/min | INTRAVENOUS | Status: DC
  Administered 2018-12-03: 12 m[IU]/min via INTRAVENOUS
  Administered 2018-12-03: 2 m[IU]/min via INTRAVENOUS

## 2018-12-03 MED ORDER — LIDOCAINE HCL (PF) 1 % IJ SOLN
INTRAMUSCULAR | Status: DC | PRN
  Administered 2018-12-03: 5 mL via EPIDURAL
  Administered 2018-12-03: 4 mL via EPIDURAL

## 2018-12-03 MED ORDER — NALBUPHINE HCL 10 MG/ML IJ SOLN: 5.0000 mg | INTRAMUSCULAR | Status: DC | PRN

## 2018-12-03 MED ORDER — MISOPROSTOL 25 MCG QUARTER TABLET: 25.0000 ug | ORAL_TABLET | ORAL | Status: DC | PRN

## 2018-12-03 MED ORDER — OXYTOCIN BOLUS FROM INFUSION
500.0000 mL | Freq: Once | INTRAVENOUS | Status: AC
  Administered 2018-12-04: 500 mL via INTRAVENOUS

## 2018-12-03 MED ORDER — NALBUPHINE HCL 10 MG/ML IJ SOLN: 5.0000 mg | Freq: Once | INTRAMUSCULAR | Status: AC | PRN

## 2018-12-03 MED ORDER — TERBUTALINE SULFATE 1 MG/ML IJ SOLN: 0.2500 mg | Freq: Once | INTRAMUSCULAR | Status: DC | PRN

## 2018-12-03 MED ORDER — OXYCODONE-ACETAMINOPHEN 5-325 MG PO TABS: 2.0000 | ORAL_TABLET | ORAL | Status: DC | PRN

## 2018-12-03 MED ORDER — SOD CITRATE-CITRIC ACID 500-334 MG/5ML PO SOLN: 30.0000 mL | ORAL | Status: DC | PRN

## 2018-12-03 MED ORDER — DIPHENHYDRAMINE HCL 50 MG/ML IJ SOLN
12.5000 mg | INTRAMUSCULAR | Status: AC | PRN
  Administered 2018-12-03 (×3): 12.5 mg via INTRAVENOUS
  Filled 2018-12-03 (×2): qty 1

## 2018-12-03 MED ORDER — LACTATED RINGERS IV SOLN
500.0000 mL | Freq: Once | INTRAVENOUS | Status: AC
  Administered 2018-12-03: 1000 mL via INTRAVENOUS

## 2018-12-03 MED ORDER — LACTATED RINGERS IV SOLN
INTRAVENOUS | Status: DC
  Administered 2018-12-03: 08:00:00 via INTRAVENOUS

## 2018-12-03 MED ORDER — OXYCODONE-ACETAMINOPHEN 5-325 MG PO TABS: 1.0000 | ORAL_TABLET | ORAL | Status: DC | PRN

## 2018-12-03 MED ORDER — HYDROXYZINE HCL 50 MG PO TABS
25.0000 mg | ORAL_TABLET | Freq: Once | ORAL | Status: AC
  Administered 2018-12-03: 25 mg via ORAL
  Filled 2018-12-03: qty 1

## 2018-12-03 MED ORDER — LACTATED RINGERS IV SOLN: 500.0000 mL | INTRAVENOUS | Status: DC | PRN

## 2018-12-03 MED ORDER — ONDANSETRON HCL 4 MG/2ML IJ SOLN
4.0000 mg | Freq: Four times a day (QID) | INTRAMUSCULAR | Status: DC | PRN
  Administered 2018-12-03: 4 mg via INTRAVENOUS
  Filled 2018-12-03: qty 2

## 2018-12-03 NOTE — Anesthesia Procedure Notes (Signed)
Epidural Patient location during procedure: OB Start time: 12/03/2018 11:43 AM End time: 12/03/2018 11:48 AM  Staffing Anesthesiologist: Beryle Lathe, MD Performed: anesthesiologist   Preanesthetic Checklist Completed: patient identified, pre-op evaluation, timeout performed, IV checked, risks and benefits discussed and monitors and equipment checked  Epidural Patient position: sitting Prep: DuraPrep Patient monitoring: continuous pulse ox and blood pressure Approach: midline Location: L2-L3 Injection technique: LOR saline  Needle:  Needle type: Tuohy  Needle gauge: 17 G Needle length: 9 cm Needle insertion depth: 9 cm Catheter size: 19 Gauge Catheter at skin depth: 15 cm Test dose: negative and Other (1% lidocaine)  Assessment Events: blood not aspirated  Additional Notes Patient identified. Risks including, but not limited to, bleeding, infection, nerve damage, paralysis, inadequate analgesia, blood pressure changes, nausea, vomiting, allergic reaction, postpartum back pain, itching, and headache were discussed. Patient expressed understanding and wished to proceed. Sterile prep and drape, including hand hygiene, mask, and sterile gloves were used. The patient was positioned and the spine was prepped. The skin was anesthetized with lidocaine. No paraesthesia or other complication noted. The patient did not experience any signs of intravascular injection such as tinnitus or metallic taste in mouth, nor signs of intrathecal spread such as rapid motor block. Please see nursing notes for vital signs. The patient tolerated the procedure well.   Leslye Peer, MDReason for block:procedure for pain

## 2018-12-03 NOTE — Progress Notes (Signed)
LABOR PROGRESS NOTE  Autumn Duarte is a 23 y.o. G1P0 at [redacted]w[redacted]d  admitted for IOL for di-di twins and gHTN.   Subjective: Patient has epidural. Shaking but otherwise comfortable. FOB at bedside.   Objective: BP (!) 159/102   Pulse 99   Temp 97.9 F (36.6 C) (Oral)   Resp 17   Ht 5\' 3"  (1.6 m)   Wt (!) 143.8 kg   LMP 03/19/2018   SpO2 100%   BMI 56.15 kg/m  or  Vitals:   12/03/18 1300 12/03/18 1301 12/03/18 1330 12/03/18 1401  BP:  (!) 146/93 (!) 159/100 (!) 159/102  Pulse:  87 94 99  Resp:  17 16 17   Temp:      TempSrc:      SpO2: 100%     Weight:      Height:       Dilation: 5 Effacement (%): 70 Cervical Position: Middle Station: -3 Presentation: Vertex Exam by:: Dr. [name] FHT:  Twin A: 135 bpm, moderate variability, +acels, no decels  Twin B: 145 bpm, moderate variability, +acels, no decels  Toco: q2-3 min   Labs: Lab Results  Component Value Date   WBC 8.7 12/03/2018   HGB 10.1 (L) 12/03/2018   HCT 30.2 (L) 12/03/2018   MCV 84.6 12/03/2018   PLT 256 12/03/2018    Patient Active Problem List   Diagnosis Date Noted  . Gestational hypertension 12/03/2018  . Gestational hypertension, third trimester 11/29/2018  . Supervision of high risk pregnancy, antepartum 06/11/2018  . Dichorionic diamniotic twin gestation 06/11/2018  . Morbid obesity with BMI of 50.0-59.9, adult (HCC) 06/11/2018  . Maternal morbid obesity, antepartum (HCC) 06/11/2018  . PROBLEMS WITH HEARING 11/18/2008  . ECZEMA 04/16/2007    Assessment / Plan: 23 y.o. G1P0 at [redacted]w[redacted]d here for IOL for di-di twins/gHTN. PIH labs WNL.   Labor: Progressing well. On pitocin at 10 mu/min. AROM performed at 1430 with large amount of clear fluid. FSE placed on Twin A due to difficulty tracing externally. IUPC placed for more adequate assessment of MVUs.  Fetal Wellbeing:  Cat I Twin A and B  Pain Control:  Epidural in place  Anticipated MOD:  NSVD   Marcy Siren, D.O. OB Fellow  12/03/2018, 2:46  PM

## 2018-12-03 NOTE — H&P (Signed)
LABOR AND DELIVERY ADMISSION HISTORY AND PHYSICAL NOTE  Autumn Duarte is a 23 y.o. female G1P0 with IUP at 9764w0d by LMP presenting for IOL for di-di twins and gHTN.  She reports positive fetal movement. She denies leakage of fluid or vaginal bleeding.  Prenatal History/Complications: [facility]  Pregnancy complications:  - di-di twins vertex/vertex without significant FW discordancy  -gHTN (no medications)  -morbid obesity, BMI >55 -asthma   Past Medical History: Past Medical History:  Diagnosis Date  . Asthma    last used inhaler 2 months ago  . Bronchitis   . Eczema   . Obesity   . Seasonal allergies     Past Surgical History: Past Surgical History:  Procedure Laterality Date  . WISDOM TOOTH EXTRACTION      Obstetrical History: OB History    Gravida  1   Para      Term      Preterm      AB      Living  0     SAB      TAB      Ectopic      Multiple      Live Births              Social History: Social History   Socioeconomic History  . Marital status: Single    Spouse name: Not on file  . Number of children: Not on file  . Years of education: Not on file  . Highest education level: Not on file  Occupational History  . Not on file  Social Needs  . Financial resource strain: Not on file  . Food insecurity:    Worry: Never true    Inability: Never true  . Transportation needs:    Medical: No    Non-medical: No  Tobacco Use  . Smoking status: Former Smoker    Types: Cigarettes    Last attempt to quit: 03/19/2018    Years since quitting: 0.7  . Smokeless tobacco: Never Used  Substance and Sexual Activity  . Alcohol use: Never    Frequency: Never  . Drug use: Never  . Sexual activity: Yes    Birth control/protection: None    Comment: last night  Lifestyle  . Physical activity:    Days per week: Not on file    Minutes per session: Not on file  . Stress: Not on file  Relationships  . Social connections:    Talks on phone: Not  on file    Gets together: Not on file    Attends religious service: Not on file    Active member of club or organization: Not on file    Attends meetings of clubs or organizations: Not on file    Relationship status: Not on file  Other Topics Concern  . Not on file  Social History Narrative  . Not on file    Family History: Family History  Problem Relation Age of Onset  . Arthritis Mother   . Hepatitis Mother   . Thyroid disease Mother   . Diabetes Father   . Heart disease Father   . Hypertension Father   . Breast cancer Maternal Aunt   . Breast cancer Maternal Grandmother     Allergies: No Known Allergies  Medications Prior to Admission  Medication Sig Dispense Refill Last Dose  . acetaminophen (TYLENOL) 325 MG tablet Take 650 mg by mouth every 6 (six) hours as needed.   Past Week at Unknown  time  . aspirin EC 81 MG tablet Take 1 tablet (81 mg total) by mouth daily. Take after 12 weeks for prevention of preeclampsia later in pregnancy 300 tablet 2 12/02/2018 at Unknown time  . folic acid (FOLVITE) 1 MG tablet Take 1 tablet (1 mg total) by mouth daily. 60 tablet 3 12/02/2018 at Unknown time  . metoCLOPramide (REGLAN) 10 MG tablet Take 1 tablet (10 mg total) by mouth 4 (four) times daily. 120 tablet 1 12/02/2018 at Unknown time  . Prenatal Vit-Fe Fumarate-FA (PREPLUS) 27-1 MG TABS Take 1 tablet by mouth daily. 60 tablet 3 12/02/2018 at Unknown time  . cyclobenzaprine (FLEXERIL) 10 MG tablet Take 1 tablet (10 mg total) by mouth 3 (three) times daily as needed for muscle spasms. (Patient not taking: Reported on 11/28/2018) 30 tablet 0 Not Taking  . terconazole (TERAZOL 7) 0.4 % vaginal cream Place 1 applicator vaginally at bedtime. (Patient not taking: Reported on 11/28/2018) 45 g 0 Not Taking     Review of Systems  All systems reviewed and negative except as stated in HPI  Physical Exam Blood pressure (!) 144/83, pulse 94, temperature 97.9 F (36.6 C), temperature source Oral, resp.  rate 17, height  (1.6 m), weight (!) 143.8 kg, last menstrual period 03/19/2018. General appearance: alert, oriented, NAD Lungs: normal respiratory effort Heart: regular rate Abdomen: soft, non-tender; gravid, FH appropriate for GA Extremities: No calf swelling or tenderness Presentation: cephalic, cephalic  Fetal monitoring:  Twin A: 135 bpm, moderate variability, +acels, no decels  Twin B: 140 bpm, moderate variability, +acels, no decels  Uterine activity: Irregular, q6-9 min  Dilation: 2.5 Effacement (%): 70 Station: -3 Exam by:: Ashley Mariner RNC  Prenatal labs: ABO, Rh: --/--/O POS (05/04 1324) Antibody: NEG (05/04 0755) Rubella:  Unknown, pending  RPR: Non Reactive (02/28 0000)  HBsAg:   Unknown, pending  HIV: Non Reactive (02/28 0000)  GC/Chlamydia: Negative  GBS: Negative (04/30 0000)  2-hr GTT: Normal  Genetic screening:  Normal  Anatomy US: Normal   Prenatal Transfer Tool  Maternal Diabetes: No Genetic Screening: Normal Maternal Ultrasounds/Referrals: Normal Fetal Ultrasounds or other Referrals:  Referred to Materal Fetal Medicine  Maternal Substance Abuse:  No Significant Maternal Medications:  None Significant Maternal Lab Results: Lab values include: Group B Strep negative  Results for orders placed or performed during the hospital encounter of 12/03/18 (from the past 24 hour(s))  Type and screen   Collection Time: 12/03/18  7:55 AM  Result Value Ref Range   ABO/RH(D) O POS    Antibody Screen NEG    Sample Expiration      12/06/2018 Performed at [facility], [address] [place], [place] [address]   CBC   Collection Time: 12/03/18  8:01 AM  Result Value Ref Range   WBC 8.7 4.0 - 10.5 K/uL   RBC 3.57 (L) 3.87 - 5.11 MIL/uL   Hemoglobin 10.1 (L) 12.0 - 15.0 g/dL   HCT 72.5 (L) 36.6 - 44.0 %   MCV 84.6 80.0 - 100.0 fL   MCH 28.3 26.0 - 34.0 pg   MCHC 33.4 30.0 - 36.0 g/dL   RDW 34.7 42.5 - 95.6 %   Platelets 256 150 - 400 K/uL    nRBC 0.0 0.0 - 0.2 %    Patient Active Problem List   Diagnosis Date Noted  . Gestational hypertension 12/03/2018  . Gestational hypertension, third trimester 11/29/2018  . Supervision of high risk pregnancy, antepartum 06/11/2018  .  Dichorionic diamniotic twin gestation 06/11/2018  . Morbid obesity with BMI of 50.0-59.9, adult (HCC) 06/11/2018  . Maternal morbid obesity, antepartum (HCC) 06/11/2018  . PROBLEMS WITH HEARING 11/18/2008  . ECZEMA 04/16/2007    Assessment: Autumn L Maden is a 23 y.o. G1P0 at [redacted]w[redacted]d here for IOL for di-di twins and gHTN. Will order [facility] labs for baseline.   #Labor: Given favorable cervix will start induction with Pitocin 2x2.  #Pain: Plans epidural  #FWB: Cat I for twin A and B  #ID:  GBS neg  #MOF: Breast  #MOC:Undecided  #Circ:  N/A   De Hollingshead 12/03/2018, 9:19 AM

## 2018-12-03 NOTE — Anesthesia Preprocedure Evaluation (Signed)
Anesthesia Evaluation  Patient identified by MRN, date of birth, ID band Patient awake    Reviewed: Allergy & Precautions, NPO status , Patient's Chart, lab work & pertinent test results  History of Anesthesia Complications Negative for: history of anesthetic complications  Airway Mallampati: II  TM Distance: >3 FB Neck ROM: Full    Dental   Pulmonary asthma , former smoker,    breath sounds clear to auscultation       Cardiovascular hypertension,  Rhythm:Regular Rate:Normal     Neuro/Psych negative neurological ROS  negative psych ROS   GI/Hepatic negative GI ROS, Neg liver ROS,   Endo/Other  Morbid obesity  Renal/GU negative Renal ROS     Musculoskeletal negative musculoskeletal ROS (+)   Abdominal (+) + obese,   Peds  Hematology  (+) anemia ,   Anesthesia Other Findings   Reproductive/Obstetrics (+) Pregnancy                             Anesthesia Physical Anesthesia Plan  ASA: III  Anesthesia Plan: Epidural   Post-op Pain Management:    Induction:   PONV Risk Score and Plan: 2 and Treatment may vary due to age or medical condition  Airway Management Planned: Natural Airway  Additional Equipment: None  Intra-op Plan:   Post-operative Plan:   Informed Consent: I have reviewed the patients History and Physical, chart, labs and discussed the procedure including the risks, benefits and alternatives for the proposed anesthesia with the patient or authorized representative who has indicated his/her understanding and acceptance.       Plan Discussed with: Anesthesiologist  Anesthesia Plan Comments: (Labs reviewed. Platelets acceptable, patient not taking any blood thinning medications. Per RN, FHR tracing reported to be stable enough for sitting procedure. Risks and benefits discussed with patient, including PDPH, backache, epidural hematoma, failed epidural, allergic  reaction, and nerve injury. Patient expressed understanding and wished to proceed.)        Anesthesia Quick Evaluation

## 2018-12-03 NOTE — Progress Notes (Signed)
OB/GYN Faculty Practice: Labor Progress Note  Subjective: Doing well, having trouble with itching - has already tried IV Benadryl, Nubain. Denies headaches and vision changes. Is feeling some pressure below with contractions.   RN also noted having some blood-tinged urine.  Objective: BP 124/84   Pulse 86   Temp 98.3 F (36.8 C) (Oral)   Resp 18   Ht 5\' 3"  (1.6 m)   Wt (!) 143.8 kg   LMP 03/19/2018   SpO2 98%   BMI 56.15 kg/m  Gen: tired appearing, NAD Dilation: 8 Effacement (%): 90 Cervical Position: Middle Station: 0 Presentation: Vertex Exam by:: L Casimer Russett  Assessment and Plan: 23 y.o. G1P0 110w0d here for IOL for gHTN. Pregnancy also notable for di-di twins.   Labor: Induction started this morning with pitocin, AROM around 1400 with clear fluid. Di-di twins, both vertex.  -- pain control: epidural -- PPH Risk: medium   Fetal Well-Being: EFW 53%(A), 65% (B) - only 5% discordance. Cephalic by BSUS of both A/B this morning.  -- Category I - continuous fetal monitoring  -- GBS negative  GHTN:  UPC 0.14, CMP, CBC wnl. Asymptomatic. BP normal and moderate range. -- continue to monitor closely   Camdyn Beske S. [name], DO OB/GYN Fellow, Faculty Practice  10:56 PM

## 2018-12-04 ENCOUNTER — Encounter (HOSPITAL_COMMUNITY): Payer: Self-pay

## 2018-12-04 DIAGNOSIS — Z3A37 37 weeks gestation of pregnancy: Secondary | ICD-10-CM

## 2018-12-04 DIAGNOSIS — O134 Gestational [pregnancy-induced] hypertension without significant proteinuria, complicating childbirth: Secondary | ICD-10-CM

## 2018-12-04 DIAGNOSIS — O30043 Twin pregnancy, dichorionic/diamniotic, third trimester: Secondary | ICD-10-CM

## 2018-12-04 DIAGNOSIS — O99214 Obesity complicating childbirth: Secondary | ICD-10-CM

## 2018-12-04 LAB — DIC (DISSEMINATED INTRAVASCULAR COAGULATION)PANEL
D-Dimer, Quant: 2.16 ug/mL-FEU — ABNORMAL HIGH (ref 0.00–0.50)
Fibrinogen: 469 mg/dL (ref 210–475)
INR: 1.2 (ref 0.8–1.2)
Platelets: 247 10*3/uL (ref 150–400)
Prothrombin Time: 14.8 seconds (ref 11.4–15.2)
Smear Review: NONE SEEN

## 2018-12-04 LAB — CBC
HCT: 27.7 % — ABNORMAL LOW (ref 36.0–46.0)
Hemoglobin: 9.3 g/dL — ABNORMAL LOW (ref 12.0–15.0)
MCH: 28.7 pg (ref 26.0–34.0)
MCHC: 33.6 g/dL (ref 30.0–36.0)
MCV: 85.5 fL (ref 80.0–100.0)
Platelets: 244 10*3/uL (ref 150–400)
RBC: 3.24 MIL/uL — ABNORMAL LOW (ref 3.87–5.11)
RDW: 13.5 % (ref 11.5–15.5)
WBC: 11.2 10*3/uL — ABNORMAL HIGH (ref 4.0–10.5)
nRBC: 0 % (ref 0.0–0.2)

## 2018-12-04 LAB — RUBELLA ANTIBODY, IGM: Rubella IgM: <20 AU/mL (ref 0.0–19.9)

## 2018-12-04 LAB — POSTPARTUM HEMORRHAGE PROTOCOL (BB NOTIFICATION)

## 2018-12-04 LAB — DIC (DISSEMINATED INTRAVASCULAR COAGULATION) PANEL: aPTT: 30 seconds (ref 24–36)

## 2018-12-04 LAB — RUBELLA SCREEN: Rubella: 1.7 index (ref >0.99)

## 2018-12-04 MED ORDER — MISOPROSTOL 200 MCG PO TABS
800.0000 ug | ORAL_TABLET | Freq: Once | ORAL | Status: AC
  Administered 2018-12-04: 800 ug via RECTAL

## 2018-12-04 MED ORDER — ONDANSETRON HCL 4 MG/2ML IJ SOLN: 4.0000 mg | INTRAMUSCULAR | Status: DC | PRN

## 2018-12-04 MED ORDER — LACTATED RINGERS IV SOLN: INTRAVENOUS | Status: DC

## 2018-12-04 MED ORDER — DIPHENHYDRAMINE HCL 25 MG PO CAPS: 25.0000 mg | ORAL_CAPSULE | Freq: Four times a day (QID) | ORAL | Status: DC | PRN

## 2018-12-04 MED ORDER — MISOPROSTOL 200 MCG PO TABS
ORAL_TABLET | ORAL | Status: AC
  Filled 2018-12-04: qty 4

## 2018-12-04 MED ORDER — MISOPROSTOL 200 MCG PO TABS
ORAL_TABLET | ORAL | Status: AC
  Filled 2018-12-04: qty 1

## 2018-12-04 MED ORDER — ZOLPIDEM TARTRATE 5 MG PO TABS: 5.0000 mg | ORAL_TABLET | Freq: Every evening | ORAL | Status: DC | PRN

## 2018-12-04 MED ORDER — CEFAZOLIN SODIUM-DEXTROSE 2-4 GM/100ML-% IV SOLN
2.0000 g | Freq: Once | INTRAVENOUS | Status: AC
  Administered 2018-12-04: 2 g via INTRAVENOUS
  Filled 2018-12-04: qty 100

## 2018-12-04 MED ORDER — TRANEXAMIC ACID-NACL 1000-0.7 MG/100ML-% IV SOLN
1000.0000 mg | Freq: Once | INTRAVENOUS | Status: AC | PRN
  Administered 2018-12-04: 05:00:00 1000 mg via INTRAVENOUS
  Filled 2018-12-04: qty 100

## 2018-12-04 MED ORDER — IBUPROFEN 600 MG PO TABS
600.0000 mg | ORAL_TABLET | Freq: Four times a day (QID) | ORAL | Status: DC
  Administered 2018-12-04 – 2018-12-06 (×8): 600 mg via ORAL
  Filled 2018-12-04 (×8): qty 1

## 2018-12-04 MED ORDER — SENNOSIDES-DOCUSATE SODIUM 8.6-50 MG PO TABS
2.0000 | ORAL_TABLET | ORAL | Status: DC
  Administered 2018-12-04 – 2018-12-05 (×2): 2 via ORAL
  Filled 2018-12-04 (×2): qty 2

## 2018-12-04 MED ORDER — METHYLERGONOVINE MALEATE 0.2 MG/ML IJ SOLN: 0.2000 mg | Freq: Once | INTRAMUSCULAR | Status: DC

## 2018-12-04 MED ORDER — WITCH HAZEL-GLYCERIN EX PADS: 1.0000 "application " | MEDICATED_PAD | CUTANEOUS | Status: DC | PRN

## 2018-12-04 MED ORDER — ACETAMINOPHEN 325 MG PO TABS
650.0000 mg | ORAL_TABLET | ORAL | Status: DC | PRN
  Administered 2018-12-04 – 2018-12-06 (×3): 650 mg via ORAL
  Filled 2018-12-04 (×3): qty 2

## 2018-12-04 MED ORDER — ONDANSETRON HCL 4 MG PO TABS: 4.0000 mg | ORAL_TABLET | ORAL | Status: DC | PRN

## 2018-12-04 MED ORDER — DIBUCAINE (PERIANAL) 1 % EX OINT: 1.0000 "application " | TOPICAL_OINTMENT | CUTANEOUS | Status: DC | PRN

## 2018-12-04 MED ORDER — PRENATAL MULTIVITAMIN CH
1.0000 | ORAL_TABLET | Freq: Every day | ORAL | Status: DC
  Administered 2018-12-04 – 2018-12-05 (×2): 1 via ORAL
  Filled 2018-12-04 (×2): qty 1

## 2018-12-04 MED ORDER — SIMETHICONE 80 MG PO CHEW: 80.0000 mg | CHEWABLE_TABLET | ORAL | Status: DC | PRN

## 2018-12-04 MED ORDER — COCONUT OIL OIL: 1.0000 "application " | TOPICAL_OIL | Status: DC | PRN

## 2018-12-04 MED ORDER — TRANEXAMIC ACID-NACL 1000-0.7 MG/100ML-% IV SOLN
INTRAVENOUS | Status: AC
  Administered 2018-12-04: 1000 mg via INTRAVENOUS
  Filled 2018-12-04: qty 100

## 2018-12-04 MED ORDER — BENZOCAINE-MENTHOL 20-0.5 % EX AERO
1.0000 "application " | INHALATION_SPRAY | CUTANEOUS | Status: DC | PRN
  Filled 2018-12-04: qty 56

## 2018-12-04 MED ORDER — LACTATED RINGERS IV BOLUS: 1000.0000 mL | Freq: Once | INTRAVENOUS | Status: DC

## 2018-12-04 MED ORDER — TRANEXAMIC ACID-NACL 1000-0.7 MG/100ML-% IV SOLN
1000.0000 mg | INTRAVENOUS | Status: AC
  Administered 2018-12-04: 1000 mg via INTRAVENOUS

## 2018-12-04 NOTE — Progress Notes (Signed)
Patient vomiting, checked and is complete. Going to start pushing.   Autumn Duarte. [name], DO OB/GYN Fellow

## 2018-12-04 NOTE — Lactation Note (Signed)
This note was copied from a baby's chart. Lactation Consultation Note  Patient Name: Autumn Duarte HIDUP'B Date: 12/04/2018 Reason for consult: Initial assessment;Primapara;1st time breastfeeding;Multiple gestation;Early term 68-38.6wks  10 hours old early term female twins who are being exclusively BF by her mother, she's a P1. Mom participated in the [facility] program at the [facility] and she's already familiar with hand expression. When LC revised hand expression with mom, she was able to get big drops of colostrum out of her left breast. Mom has a DEBP at home.  Baby A  Offered assistance with latch and mom agreed to wake baby A to feed, she said she was due, her RN Alvino Chapel was also helping her with the feeding. LC took baby A STS to mother's left breast in cross cradle position but she didn't latch, she was too sleepy to feed and wouldn't even open her mouth, she fell right back to sleep at the breast. An attempt was documented in Flowsheets.  Baby B  Mom did not request assistance with latch with baby B at this point, she said she has already fed.  LC set up a DEBP, instructions, cleaning and storage were reviewed, as well as milk storage guidelines. Reviewed normal newborn behavior, cluster feeding, LPI policy (baby B is under 6 lbs). Mom understands that due to multiple pregnancy and birth weight supplementation most likely be needed at some point and that mother's milk will always be our first option. Discussed supplementation guidelines amounts using EBM, mom understands that amounts will slowly increase every time babies turn 24 hours older.  Feeding plan:  1. Encouraged mom to feed babies STS every 3 hours or sooner if feeding cues are present 2. Hand expression and spoon feeding was also encouraged, dad is mom's support person 3. She will start pumping today, every 3 hours after feedings and will spoon feed babies any amount of EBM she may get  BF brochure, BF resources, LPI handout and  feeding diary were reviewed. Mom reported all questions and concerns were answered, she's aware of LC OP services and will call PRN.  Maternal Data Formula Feeding for Exclusion: No Has patient been taught Hand Expression?: Yes Does the patient have breastfeeding experience prior to this delivery?: No  Feeding Feeding Type: Breast Fed   Interventions Interventions: Breast feeding basics reviewed;Assisted with latch;Skin to skin;Breast massage;Hand express;Breast compression;Adjust position;Support pillows;DEBP  Lactation Tools Discussed/Used Tools: Pump Breast pump type: Double-Electric Breast Pump WIC Program: Yes Pump Review: Setup, frequency, and cleaning;Milk Storage Initiated by:: MPeck Date initiated:: 12/04/18   Consult Status Consult Status: Follow-up Date: 12/05/18 Follow-up type: In-patient    Kamil Hanigan Venetia Constable 12/04/2018, 12:51 PM

## 2018-12-04 NOTE — Consult Note (Signed)
Neonatology Note:   Attendance at Delivery:    I was asked by Dr. Marice Potter to attend this NSVD at 37 1/7 weeks for twin gestation. The mother is a G1P0 O pos, GBS neg with di-di twins, morbid obesity, asthma, and gestational HTN. She has been on a baby aspirin daily.   ROM 11 hours before delivery for Twin A, fluid clear. Infant vigorous with good spontaneous cry and tone. Delayed cord clamping was done. Needed only minimal bulb suctioning. Ap 8/9. Lungs clear to ausc in DR.  ROM just minutes before delivery for Twin B, fluid clear. Vacuum-assisted delivery due to some variable FHR decelerations. Infant vigorous with good spontaneous cry and tone. Delayed cord clamping was done. Needed only minimal bulb suctioning. Ap 9/9.  Infants are able to remain with their mother for skin to skin time under nursing supervision. Transferred to the care of Pediatrician.   Doretha Sou, MD

## 2018-12-04 NOTE — Discharge Summary (Signed)
Obstetrics Discharge Summary OB/GYN Faculty Practice   Patient Name: Autumn Duarte DOB: 31-Oct-1995 MRN: [id number]  Date of admission: 12/03/2018 Delivering MD:    Belzora, Cravotta [facility] [[id number]]  Auroara, Hutsell Goodell [[id number]]  Rhett Bannister S  Date of discharge: 12/06/2018  Admitting diagnosis: pregnancy Intrauterine pregnancy: [redacted]w[redacted]d     Secondary diagnosis:   Principal Problem:   Gestational hypertension, third trimester Active Problems:   Dichorionic diamniotic twin gestation   Maternal morbid obesity, antepartum (HCC)   Gestational hypertension   Discharge diagnosis: Term Pregnancy Delivered      Postpartum Hemorrhage                                  Postpartum procedures: None  Complications:  Postpartum Hemorrhage  Outpatient Follow-Up: [ ]  continue to counsel on contraception [ ]  BP check   Hospital course: Autumn Duarte is a 23 y.o. [redacted]w[redacted]d who was admitted for induction of labor for gestational hypertension. Her pregnancy was complicated by above noted. Her labor course was notable for induction with pitocin, epidural placement, AROM. She delivered viable twins, abotu 20 minutes apart. Delivery was complicated by postpartum hemorrhage of 2.4L which improved with cytotec and TXA. Please see delivery/op note for additional details. Her postpartum course was uncomplicated. She was breastfeeding without difficulty. By day of discharge, she was passing flatus, urinating, eating and drinking without difficulty. Her pain was well-controlled, and she was discharged home with babies. She will follow-up in clinic in 1 weeks for BP check.   Physical exam  Vitals:   12/05/18 1215 12/05/18 1410 12/05/18 2300 12/06/18 0524  BP: 119/80 119/80 118/84 135/81  Pulse: 96 94 97 93  Resp: 18 18 20    Temp: 98.3 F (36.8 C) 98.6 F (37 C) 98.6 F (37 C) 98.4 F (36.9 C)  TempSrc: Oral Oral Oral   SpO2: 98% 98%    Weight:      Height:       General: No apparent  distress, no dizziness.  Tolerating POs and activity Lochia: appropriate Uterine Fundus: firm Incision: N/A DVT Evaluation: No evidence of DVT seen on physical exam. Labs: Lab Results  Component Value Date   WBC 9.8 12/05/2018   HGB 7.2 (L) 12/05/2018   HCT 21.3 (L) 12/05/2018   MCV 87.3 12/05/2018   PLT 239 12/05/2018   CMP Latest Ref Rng & Units 12/03/2018  Glucose 70 - 99 mg/dL 93  BUN 6 - 20 mg/dL 7  Creatinine 3.22 - 0.25 mg/dL 4.27  Sodium 062 - 376 mmol/L 138  Potassium 3.5 - 5.1 mmol/L 3.7  Chloride 98 - 111 mmol/L 109  CO2 22 - 32 mmol/L 22  Calcium 8.9 - 10.3 mg/dL 2.8(B)  Total Protein 6.5 - 8.1 g/dL 5.9(L)  Total Bilirubin 0.3 - 1.2 mg/dL 0.5  Alkaline Phos 38 - 126 U/L 110  AST 15 - 41 U/L 17  ALT 0 - 44 U/L 15    Discharge instructions: Per After Visit Summary and "Baby and Me Booklet"  After visit meds:  Allergies as of 12/06/2018   No Known Allergies     Medication List    STOP taking these medications   cyclobenzaprine 10 MG tablet Commonly known as:  FLEXERIL   terconazole 0.4 % vaginal cream Commonly known as:  TERAZOL 7     TAKE these medications   acetaminophen 325 MG tablet Commonly  known as:  TYLENOL Take 650 mg by mouth every 6 (six) hours as needed.   aspirin EC 81 MG tablet Take 1 tablet (81 mg total) by mouth daily. Take after 12 weeks for prevention of preeclampsia later in pregnancy   folic acid 1 MG tablet Commonly known as:  FOLVITE Take 1 tablet (1 mg total) by mouth daily.   ibuprofen 600 MG tablet Commonly known as:  ADVIL Take 1 tablet (600 mg total) by mouth every 6 (six) hours.   metoCLOPramide 10 MG tablet Commonly known as:  REGLAN Take 1 tablet (10 mg total) by mouth 4 (four) times daily.   PrePLUS 27-1 MG Tabs Take 1 tablet by mouth daily.       Postpartum contraception: Undecided Diet: Routine Diet Activity: Advance as tolerated. Pelvic rest for 6 weeks.   Follow-up Appt: Future Appointments  Date  Time Provider Department Center  12/18/2018  2:00 PM [facility] NURSE [facility]OC  01/08/2019  1:15 PM Crisoforo OxfordKooistra, Charlesetta GaribaldiKathryn Lorraine, CNM [facility]   Follow-up Visit:No follow-ups on file.  Newborn Data:   Autumn Duarte, GirlA Autumn [[id number]][[id number]]  Live born female  Birth Weight:   APGAR: 8, 9  Newborn Delivery   Birth date/time:  12/04/2018 01:52:00 Delivery type:  Vaginal, Spontaneous      Autumn Duarte, GirlB Autumn [[id number]][[id number]]  Live born female  Birth Weight:   APGAR: 9, 9  Newborn Delivery   Birth date/time:  12/04/2018 02:12:00 Delivery type:  Vaginal, Spontaneous    Baby Feeding: Breast Disposition:home with mother

## 2018-12-04 NOTE — Anesthesia Postprocedure Evaluation (Signed)
Anesthesia Post Note  Patient: Autumn Duarte  Procedure(s) Performed: AN AD HOC LABOR EPIDURAL     Patient location during evaluation: Mother Baby Anesthesia Type: Epidural Level of consciousness: awake and alert and oriented Pain management: satisfactory to patient Vital Signs Assessment: post-procedure vital signs reviewed and stable Respiratory status: spontaneous breathing and nonlabored ventilation Cardiovascular status: stable Postop Assessment: no headache, no backache, no signs of nausea or vomiting, adequate PO intake, patient able to bend at knees, no apparent nausea or vomiting and epidural receding (patient up walking) Anesthetic complications: no    Last Vitals:  Vitals:   12/04/18 0531 12/04/18 0622  BP: 106/68 126/66  Pulse: (!) 110 97  Resp: 18 19  Temp: 37.1 C 37.1 C  SpO2:  99%    Last Pain:  Vitals:   12/04/18 0622  TempSrc: Oral  PainSc: 0-No pain   Pain Goal:                   Dare Spillman

## 2018-12-05 LAB — CBC
HCT: 18.7 % — ABNORMAL LOW (ref 36.0–46.0)
HCT: 21.3 % — ABNORMAL LOW (ref 36.0–46.0)
Hemoglobin: 6.2 g/dL — CL (ref 12.0–15.0)
Hemoglobin: 7.2 g/dL — ABNORMAL LOW (ref 12.0–15.0)
MCH: 28.7 pg (ref 26.0–34.0)
MCH: 29.5 pg (ref 26.0–34.0)
MCHC: 33.2 g/dL (ref 30.0–36.0)
MCHC: 33.8 g/dL (ref 30.0–36.0)
MCV: 86.6 fL (ref 80.0–100.0)
MCV: 87.3 fL (ref 80.0–100.0)
Platelets: 209 10*3/uL (ref 150–400)
Platelets: 239 10*3/uL (ref 150–400)
RBC: 2.16 MIL/uL — ABNORMAL LOW (ref 3.87–5.11)
RBC: 2.44 MIL/uL — ABNORMAL LOW (ref 3.87–5.11)
RDW: 14 % (ref 11.5–15.5)
RDW: 14.2 % (ref 11.5–15.5)
WBC: 9.6 10*3/uL (ref 4.0–10.5)
WBC: 9.8 10*3/uL (ref 4.0–10.5)
nRBC: 0 % (ref 0.0–0.2)
nRBC: 0 % (ref 0.0–0.2)

## 2018-12-05 LAB — PREPARE RBC (CROSSMATCH)

## 2018-12-05 MED ORDER — SODIUM CHLORIDE 0.9% IV SOLUTION
Freq: Once | INTRAVENOUS | Status: AC
  Administered 2018-12-05: 12:00:00 via INTRAVENOUS

## 2018-12-05 MED ORDER — FERROUS SULFATE 325 (65 FE) MG PO TABS
325.0000 mg | ORAL_TABLET | Freq: Two times a day (BID) | ORAL | Status: DC
  Administered 2018-12-05 – 2018-12-06 (×2): 325 mg via ORAL
  Filled 2018-12-05 (×2): qty 1

## 2018-12-05 NOTE — Progress Notes (Signed)
Post Partum Day 1 Subjective: no complaints, up ad lib, voiding and tolerating PO  Objective: Blood pressure (!) 153/97, pulse (!) 102, temperature 98.3 F (36.8 C), resp. rate 20, height 5\' 3"  (1.6 m), weight (!) 143.8 kg, last menstrual period 03/19/2018, SpO2 100 %, unknown if currently breastfeeding.  Physical Exam:  General: alert, cooperative and no distress Lochia: appropriate Uterine Fundus: firm Incision: n/a DVT Evaluation: No evidence of DVT seen on physical exam. Negative Homan's sign. No cords or calf tenderness.  Recent Labs    12/03/18 0801 12/04/18 0244  HGB 10.1* 9.3*  HCT 30.2* 27.7*    Assessment/Plan: Plan for discharge tomorrow and Breastfeeding   LOS: 2 days   Autumn Duarte 12/05/2018, 7:31 AM

## 2018-12-05 NOTE — Progress Notes (Signed)
CSW received consult for history of depression and due to MOB testing positive for barbiturates on 06/07/2018.  CSW met with MOB to offer support and complete assessment.    Per chart review, MOB received Fioricet on 06/03/2018 while in MAU due to headaches. MOB does not have any other substance use noted throughout pregnancy nor were there additional concerns noted in [facility] records. CSW will not be following CDS.   MOB resting in bed with both twins at bedside in their basinets and FOB asleep on the couch, when CSW entered the room. CSW introduced self and explained reason for consult due to mental health history. MOB acknowledged having a history of depression "almost 3 years ago". MOB stated she does not currently take medications for her depression but did receive counseling in high school but stated it was not a positive experience. MOB reported she has a PCP that has encouraged her to utilize coping mechanisms and MOB shared that when symptoms come up that walking and being with her dog help. CSW provided education regarding the baby blues period vs. perinatal mood disorders, discussed treatment and gave resources for mental health follow up if concerns arise.  CSW recommends self-evaluation during the postpartum time period using the New Mom Checklist from Postpartum Progress and encouraged MOB to contact a medical professional if symptoms are noted at any time.  MOB denied any current mental health symptoms and denied any current SI, HI or DV. MOB reported feeling well supported by her support system that consists of her mother, FOB's mother and aunt, and MOB's 4 brothers.   MOB confirmed having all essential items for infant once discharged. MOB stated infants would be sleeping in their basinets or cribs. CSW provided review of Sudden Infant Death Syndrome (SIDS) precautions and safe sleeping habits.  MOB denied having any questions or concerns for CSW. CSW identifies no further need for intervention  and no barriers to discharge at this time.  Ollen Barges, [place]  [facility] [employer] [phone]

## 2018-12-06 MED ORDER — IBUPROFEN 600 MG PO TABS: 600.0000 mg | ORAL_TABLET | Freq: Four times a day (QID) | ORAL | 0 refills | Status: DC

## 2018-12-06 MED ORDER — METOCLOPRAMIDE HCL 10 MG PO TABS: 10.0000 mg | ORAL_TABLET | Freq: Four times a day (QID) | ORAL | 1 refills | Status: DC

## 2018-12-06 NOTE — Discharge Instructions (Signed)
Vaginal Delivery, Care After °Refer to this sheet in the next few weeks. These instructions provide you with information about caring for yourself after vaginal delivery. Your health care provider may also give you more specific instructions. Your treatment has been planned according to current medical practices, but problems sometimes occur. Call your health care provider if you have any problems or questions. °What can I expect after the procedure? °After vaginal delivery, it is common to have: °· Some bleeding from your vagina. °· Soreness in your abdomen, your vagina, and the area of skin between your vaginal opening and your anus (perineum). °· Pelvic cramps. °· Fatigue. °Follow these instructions at home: °Medicines °· Take over-the-counter and prescription medicines only as told by your health care provider. °· If you were prescribed an antibiotic medicine, take it as told by your health care provider. Do not stop taking the antibiotic until it is finished. °Driving ° °· Do not drive or operate heavy machinery while taking prescription pain medicine. °· Do not drive for 24 hours if you received a sedative. °Lifestyle °· Do not drink alcohol. This is especially important if you are breastfeeding or taking medicine to relieve pain. °· Do not use tobacco products, including cigarettes, chewing tobacco, or e-cigarettes. If you need help quitting, ask your health care provider. °Eating and drinking °· Drink at least 8 eight-ounce glasses of water every day unless you are told not to by your health care provider. If you choose to breastfeed your baby, you may need to drink more water than this. °· Eat high-fiber foods every day. These foods may help prevent or relieve constipation. High-fiber foods include: °? Whole grain cereals and breads. °? Brown rice. °? Beans. °? Fresh fruits and vegetables. °Activity °· Return to your normal activities as told by your health care provider. Ask your health care provider what  activities are safe for you. °· Rest as much as possible. Try to rest or take a nap when your baby is sleeping. °· Do not lift anything that is heavier than your baby or 10 lb (4.5 kg) until your health care provider says that it is safe. °· Talk with your health care provider about when you can engage in sexual activity. This may depend on your: °? Risk of infection. °? Rate of healing. °? Comfort and desire to engage in sexual activity. °Vaginal Care °· If you have an episiotomy or a vaginal tear, check the area every day for signs of infection. Check for: °? More redness, swelling, or pain. °? More fluid or blood. °? Warmth. °? Pus or a bad smell. °· Do not use tampons or douches until your health care provider says this is safe. °· Watch for any blood clots that may pass from your vagina. These may look like clumps of dark red, brown, or black discharge. °General instructions °· Keep your perineum clean and dry as told by your health care provider. °· Wear loose, comfortable clothing. °· Wipe from front to back when you use the toilet. °· Ask your health care provider if you can shower or take a bath. If you had an episiotomy or a perineal tear during labor and delivery, your health care provider may tell you not to take baths for a certain length of time. °· Wear a bra that supports your breasts and fits you well. °· If possible, have someone help you with household activities and help care for your baby for at least a few days after you   leave the hospital. °· Keep all follow-up visits for you and your baby as told by your health care provider. This is important. °Contact a health care provider if: °· You have: °? Vaginal discharge that has a bad smell. °? Difficulty urinating. °? Pain when urinating. °? A sudden increase or decrease in the frequency of your bowel movements. °? More redness, swelling, or pain around your episiotomy or vaginal tear. °? More fluid or blood coming from your episiotomy or vaginal  tear. °? Pus or a bad smell coming from your episiotomy or vaginal tear. °? A fever. °? A rash. °? Little or no interest in activities you used to enjoy. °? Questions about caring for yourself or your baby. °· Your episiotomy or vaginal tear feels warm to the touch. °· Your episiotomy or vaginal tear is separating or does not appear to be healing. °· Your breasts are painful, hard, or turn red. °· You feel unusually sad or worried. °· You feel nauseous or you vomit. °· You pass large blood clots from your vagina. If you pass a blood clot from your vagina, save it to show to your health care provider. Do not flush blood clots down the toilet without having your health care provider look at them. °· You urinate more than usual. °· You are dizzy or light-headed. °· You have not breastfed at all and you have not had a menstrual period for 12 weeks after delivery. °· You have stopped breastfeeding and you have not had a menstrual period for 12 weeks after you stopped breastfeeding. °Get help right away if: °· You have: °? Pain that does not go away or does not get better with medicine. °? Chest pain. °? Difficulty breathing. °? Blurred vision or spots in your vision. °? Thoughts about hurting yourself or your baby. °· You develop pain in your abdomen or in one of your legs. °· You develop a severe headache. °· You faint. °· You bleed from your vagina so much that you fill two sanitary pads in one hour. °This information is not intended to replace advice given to you by your health care provider. Make sure you discuss any questions you have with your health care provider. °Document Released: 07/15/2000 Document Revised: 12/30/2015 Document Reviewed: 08/02/2015 °Elsevier Interactive Patient Education © 2019 [employer] Inc[employer] ° °

## 2018-12-06 NOTE — Lactation Note (Signed)
This note was copied from a baby's chart. Lactation Consultation Note:  Mother of twins that are 28 hours old and 37.1 weeks. Infants are at 3 % weight loss. Both infants were just given 30 ml of fomula. Mother was sat up with a DEBP and has not been using consistently.   She has not been hand expressing frequently. Assist mother with hand expression and observed sprays of milk. Hand expressed 5 ml in just a min.  Mother reports that she latches infant on the breast several times daily.   Mother reports that she is not happy that infants are getting so much formula. She reports that she really wants infants to have breastmilk.   Lots of teaching on consistent pump, supply and demand and cue base feed with infants to the breast before supplementing with ebm/formula. Discussed frequent STS. Encouraged FOB to assist mother with bottle feeding infant .   Mother was fit with a #24 NS. Mother reports that the babies latch better without the shield.   Mother has a [employer] DEBP at home. She also has a harmony hand pump.   Mother advised to post pump every 2-3 hours for 15-20 mins.mother to pump her breast for 20 mins piror to discharge.  Discussed cluster feeding.  Discussed treatment and prevention of engorgement.   Mother is active with WIC . Advised mother to phone [facility] today and request the breastfeeding package. WIC referral form sent in hopes that mother can get a Symphony pump.  Mother to page [facility] if she wants assistance with positioning infant to the breast and observe a feeding.   Mother to follow up with outpatient dept.   Patient Name: Autumn Duarte VFIEP'P Date: 12/06/2018 Reason for consult: Follow-up assessment   Maternal Data    Feeding Feeding Type: Formula Nipple Type: Slow - flow  LATCH Score                   Interventions Interventions: Hand express;Pre-pump if needed;Hand pump;DEBP  Lactation Tools Discussed/Used     Consult Status Consult  Status: Complete    Michel Bickers 12/06/2018, 10:43 AM

## 2018-12-07 LAB — TYPE AND SCREEN
ABO/RH(D): O POS
Antibody Screen: NEGATIVE
Unit division: 0
Unit division: 0

## 2018-12-07 LAB — BPAM RBC
Blood Product Expiration Date: 202005172359
Blood Product Expiration Date: 202005172359
ISSUE DATE / TIME: 202005061146
Unit Type and Rh: 5100
Unit Type and Rh: 5100

## 2018-12-13 ENCOUNTER — Encounter (HOSPITAL_COMMUNITY): Payer: Self-pay | Admitting: *Deleted

## 2018-12-13 ENCOUNTER — Other Ambulatory Visit: Payer: Self-pay

## 2018-12-13 ENCOUNTER — Inpatient Hospital Stay (HOSPITAL_COMMUNITY)
Admission: AD | Admit: 2018-12-13 | Discharge: 2018-12-13 | Disposition: A | Discharge status: Home / Self Care | Payer: Medicaid Other | Attending: Obstetrics and Gynecology | Admitting: Obstetrics and Gynecology

## 2018-12-13 DIAGNOSIS — O9989 Other specified diseases and conditions complicating pregnancy, childbirth and the puerperium: Secondary | ICD-10-CM

## 2018-12-13 DIAGNOSIS — Z87891 Personal history of nicotine dependence: Secondary | ICD-10-CM | POA: Diagnosis not present

## 2018-12-13 DIAGNOSIS — Z8349 Family history of other endocrine, nutritional and metabolic diseases: Secondary | ICD-10-CM | POA: Diagnosis not present

## 2018-12-13 DIAGNOSIS — Z7982 Long term (current) use of aspirin: Secondary | ICD-10-CM | POA: Insufficient documentation

## 2018-12-13 DIAGNOSIS — Z8249 Family history of ischemic heart disease and other diseases of the circulatory system: Secondary | ICD-10-CM | POA: Diagnosis not present

## 2018-12-13 DIAGNOSIS — R51 Headache: Secondary | ICD-10-CM | POA: Diagnosis not present

## 2018-12-13 DIAGNOSIS — Z833 Family history of diabetes mellitus: Secondary | ICD-10-CM | POA: Diagnosis not present

## 2018-12-13 DIAGNOSIS — Z8759 Personal history of other complications of pregnancy, childbirth and the puerperium: Secondary | ICD-10-CM

## 2018-12-13 DIAGNOSIS — R030 Elevated blood-pressure reading, without diagnosis of hypertension: Secondary | ICD-10-CM | POA: Insufficient documentation

## 2018-12-13 LAB — CBC WITH DIFFERENTIAL/PLATELET
Abs Immature Granulocytes: 0.02 10*3/uL (ref 0.00–0.07)
Basophils Absolute: 0 10*3/uL (ref 0.0–0.1)
Basophils Relative: 0 %
Eosinophils Absolute: 0.1 10*3/uL (ref 0.0–0.5)
Eosinophils Relative: 1 %
HCT: 26.2 % — ABNORMAL LOW (ref 36.0–46.0)
Hemoglobin: 8.6 g/dL — ABNORMAL LOW (ref 12.0–15.0)
Immature Granulocytes: 0 %
Lymphocytes Relative: 28 %
Lymphs Abs: 2.1 10*3/uL (ref 0.7–4.0)
MCH: 28.7 pg (ref 26.0–34.0)
MCHC: 32.8 g/dL (ref 30.0–36.0)
MCV: 87.3 fL (ref 80.0–100.0)
Monocytes Absolute: 0.4 10*3/uL (ref 0.1–1.0)
Monocytes Relative: 5 %
Neutro Abs: 4.7 10*3/uL (ref 1.7–7.7)
Neutrophils Relative %: 66 %
Platelets: 363 10*3/uL (ref 150–400)
RBC: 3 MIL/uL — ABNORMAL LOW (ref 3.87–5.11)
RDW: 13.9 % (ref 11.5–15.5)
WBC: 7.3 10*3/uL (ref 4.0–10.5)
nRBC: 0 % (ref 0.0–0.2)

## 2018-12-13 LAB — COMPREHENSIVE METABOLIC PANEL
ALT: 22 U/L (ref 0–44)
AST: 20 U/L (ref 15–41)
Albumin: 2.8 g/dL — ABNORMAL LOW (ref 3.5–5.0)
Alkaline Phosphatase: 49 U/L (ref 38–126)
Anion gap: 9 (ref 5–15)
BUN: 12 mg/dL (ref 6–20)
CO2: 24 mmol/L (ref 22–32)
Calcium: 8.9 mg/dL (ref 8.9–10.3)
Chloride: 107 mmol/L (ref 98–111)
Creatinine, Ser: 0.86 mg/dL (ref 0.44–1.00)
GFR calc Af Amer: >60 mL/min (ref >60)
GFR calc non Af Amer: >60 mL/min (ref >60)
Glucose, Bld: 83 mg/dL (ref 70–99)
Potassium: 4.1 mmol/L (ref 3.5–5.1)
Sodium: 140 mmol/L (ref 135–145)
Total Bilirubin: 0.4 mg/dL (ref 0.3–1.2)
Total Protein: 6.1 g/dL — ABNORMAL LOW (ref 6.5–8.1)

## 2018-12-13 LAB — URINALYSIS, ROUTINE W REFLEX MICROSCOPIC
Bilirubin Urine: NEGATIVE
Glucose, UA: NEGATIVE mg/dL
Ketones, ur: NEGATIVE mg/dL
Nitrite: NEGATIVE
Protein, ur: 100 mg/dL — AB
RBC / HPF: >50 RBC/hpf — ABNORMAL HIGH (ref 0–5)
Specific Gravity, Urine: 1.027 (ref 1.005–1.030)
WBC, UA: >50 WBC/hpf — ABNORMAL HIGH (ref 0–5)
pH: 5 (ref 5.0–8.0)

## 2018-12-13 LAB — PROTEIN / CREATININE RATIO, URINE
Creatinine, Urine: 222.5 mg/dL
Protein Creatinine Ratio: 0.21 mg/mg{Cre} — ABNORMAL HIGH (ref 0.00–0.15)
Total Protein, Urine: 46 mg/dL

## 2018-12-13 MED ORDER — DEXAMETHASONE SODIUM PHOSPHATE 10 MG/ML IJ SOLN
10.0000 mg | Freq: Once | INTRAMUSCULAR | Status: AC
  Administered 2018-12-13: 10 mg via INTRAVENOUS
  Filled 2018-12-13: qty 1

## 2018-12-13 MED ORDER — ACETAMINOPHEN 325 MG PO TABS
650.0000 mg | ORAL_TABLET | Freq: Once | ORAL | Status: AC
  Administered 2018-12-13: 650 mg via ORAL
  Filled 2018-12-13: qty 2

## 2018-12-13 MED ORDER — METOCLOPRAMIDE HCL 5 MG/ML IJ SOLN
10.0000 mg | Freq: Once | INTRAMUSCULAR | Status: AC
  Administered 2018-12-13: 10 mg via INTRAVENOUS
  Filled 2018-12-13: qty 2

## 2018-12-13 MED ORDER — DIPHENHYDRAMINE HCL 50 MG/ML IJ SOLN
25.0000 mg | Freq: Once | INTRAMUSCULAR | Status: AC
  Administered 2018-12-13: 25 mg via INTRAVENOUS
  Filled 2018-12-13: qty 1

## 2018-12-13 NOTE — Discharge Instructions (Signed)

## 2018-12-13 NOTE — MAU Provider Note (Signed)
History     CSN: [id number]  Arrival date and time: 12/13/18 1056   First Provider Initiated Contact with Patient 12/13/18 1145      Chief Complaint  Patient presents with  . Hypertension   HPI Autumn Duarte is a 23 y.o. [id number] postpartum patient who presents to MAU with chief complaint of  headache in the setting of hx of Gestational Hypertension. Patient called clinic this morning and was told to seek care in MAU. She is s/p SVD of di/di twins on 12/04/18.  Headache This is a recurring problem, onset three days ago. Patient reports pain 7/10 behind both eyes. Her pain does not radiate. She took 1000 mg Tylenol around 8am today but did not experience relief.  Patient denies visual disturbances but states she saw a few spots yesterday which resolved with rest and hydration. She denies RUQ or any other upper abdominal pain. She denies new onset swelling.  Patient reports very little intake today. She has consumed a single cup of water and "part of a muffin" as of noon today.  OB History    Gravida  1   Para  1   Term  1   Preterm      AB      Living  2     SAB      TAB      Ectopic      Multiple  1   Live Births  2           Past Medical History:  Diagnosis Date  . Asthma    last used inhaler 2 months ago  . Bronchitis   . Eczema   . Obesity   . Seasonal allergies     Past Surgical History:  Procedure Laterality Date  . WISDOM TOOTH EXTRACTION      Family History  Problem Relation Age of Onset  . Arthritis Mother   . Hepatitis Mother   . Thyroid disease Mother   . Diabetes Father   . Heart disease Father   . Hypertension Father   . Breast cancer Maternal Aunt   . Breast cancer Maternal Grandmother     Social History   Tobacco Use  . Smoking status: Former Smoker    Types: Cigarettes    Last attempt to quit: 03/19/2018    Years since quitting: 0.7  . Smokeless tobacco: Never Used  Substance Use Topics  . Alcohol use: Never   Frequency: Never  . Drug use: Never    Allergies: No Known Allergies  Medications Prior to Admission  Medication Sig Dispense Refill Last Dose  . acetaminophen (TYLENOL) 325 MG tablet Take 650 mg by mouth every 6 (six) hours as needed.   12/13/2018 at 0900  . aspirin EC 81 MG tablet Take 1 tablet (81 mg total) by mouth daily. Take after 12 weeks for prevention of preeclampsia later in pregnancy 300 tablet 2 12/12/2018 at 2330  . ibuprofen (ADVIL) 600 MG tablet Take 1 tablet (600 mg total) by mouth every 6 (six) hours. 30 tablet 0 12/13/2018 at 0815  . metoCLOPramide (REGLAN) 10 MG tablet Take 1 tablet (10 mg total) by mouth 4 (four) times daily. 120 tablet 1 Past Week at Unknown time  . Prenatal Vit-Fe Fumarate-FA (PREPLUS) 27-1 MG TABS Take 1 tablet by mouth daily. 60 tablet 3 Past Week at Unknown time  . folic acid (FOLVITE) 1 MG tablet Take 1 tablet (1 mg total) by mouth daily. 60 tablet 3 12/02/2018  at Unknown time    Review of Systems Physical Exam   Blood pressure (!) 140/96, pulse 85, temperature 98.3 F (36.8 C), resp. rate 18, last menstrual period 03/19/2018, SpO2 100 %, unknown if currently breastfeeding.  Physical Exam  Nursing note and vitals reviewed. Constitutional: She is oriented to person, place, and time. She appears well-developed and well-nourished.  Cardiovascular: Normal rate.  Respiratory: Effort normal and breath sounds normal. No respiratory distress.  GI: She exhibits no distension. There is no abdominal tenderness. There is no rebound and no guarding.  Neurological: She is alert and oriented to person, place, and time. She has normal strength and normal reflexes.  Skin: Skin is warm and dry.  Psychiatric: She has a normal mood and affect. Her behavior is normal. Judgment and thought content normal.    MAU Course/MDM  Procedures  --Elevated BP, no severe range --Patient sleeping after headache cocktail --Reviewed nutrition recommendations for postpartum,  exclusively breastfeeding twins.  Patient Vitals for the past 24 hrs:  BP Temp Temp src Pulse Resp SpO2  12/13/18 1411 123/74 98.2 F (36.8 C) Oral 76 20 100 %  12/13/18 1401 118/66 - - 70 - -  12/13/18 1346 132/86 - - 70 - -  12/13/18 1331 (!) 150/85 - - 78 - -  12/13/18 1316 (!) 144/104 - - 76 - -  12/13/18 1301 122/69 - - 62 - -  12/13/18 1246 (!) 135/99 - - 79 - -  12/13/18 1231 (!) 144/89 - - 73 - -  12/13/18 1217 134/89 - - 79 - -  12/13/18 1201 138/90 - - 78 - -  12/13/18 1146 (!) 142/101 - - 84 - -  12/13/18 1133 (!) 140/96 - - 85 - -  12/13/18 1112 (!) 149/99 98.3 F (36.8 C) - 92 18 100 %    Results for orders placed or performed during the hospital encounter of 12/13/18 (from the past 24 hour(s))  CBC with Differential/Platelet     Status: Abnormal   Collection Time: 12/13/18 11:36 AM  Result Value Ref Range   WBC 7.3 4.0 - 10.5 K/uL   RBC 3.00 (L) 3.87 - 5.11 MIL/uL   Hemoglobin 8.6 (L) 12.0 - 15.0 g/dL   HCT 44.026.2 (L) 10.236.0 - 72.546.0 %   MCV 87.3 80.0 - 100.0 fL   MCH 28.7 26.0 - 34.0 pg   MCHC 32.8 30.0 - 36.0 g/dL   RDW 36.613.9 44.011.5 - 34.715.5 %   Platelets 363 150 - 400 K/uL   nRBC 0.0 0.0 - 0.2 %   Neutrophils Relative % 66 %   Neutro Abs 4.7 1.7 - 7.7 K/uL   Lymphocytes Relative 28 %   Lymphs Abs 2.1 0.7 - 4.0 K/uL   Monocytes Relative 5 %   Monocytes Absolute 0.4 0.1 - 1.0 K/uL   Eosinophils Relative 1 %   Eosinophils Absolute 0.1 0.0 - 0.5 K/uL   Basophils Relative 0 %   Basophils Absolute 0.0 0.0 - 0.1 K/uL   Immature Granulocytes 0 %   Abs Immature Granulocytes 0.02 0.00 - 0.07 K/uL  Comprehensive metabolic panel     Status: Abnormal   Collection Time: 12/13/18 11:36 AM  Result Value Ref Range   Sodium 140 135 - 145 mmol/L   Potassium 4.1 3.5 - 5.1 mmol/L   Chloride 107 98 - 111 mmol/L   CO2 24 22 - 32 mmol/L   Glucose, Bld 83 70 - 99 mg/dL   BUN 12 6 - 20  mg/dL   Creatinine, Ser 0.10 0.44 - 1.00 mg/dL   Calcium 8.9 8.9 - 27.2 mg/dL   Total Protein  6.1 (L) 6.5 - 8.1 g/dL   Albumin 2.8 (L) 3.5 - 5.0 g/dL   AST 20 15 - 41 U/L   ALT 22 0 - 44 U/L   Alkaline Phosphatase 49 38 - 126 U/L   Total Bilirubin 0.4 0.3 - 1.2 mg/dL   GFR calc non Af Amer >60 >60 mL/min   GFR calc Af Amer >60 >60 mL/min   Anion gap 9 5 - 15  Urinalysis, Routine w reflex microscopic     Status: Abnormal   Collection Time: 12/13/18 12:23 PM  Result Value Ref Range   Color, Urine YELLOW YELLOW   APPearance HAZY (A) CLEAR   Specific Gravity, Urine 1.027 1.005 - 1.030   pH 5.0 5.0 - 8.0   Glucose, UA NEGATIVE NEGATIVE mg/dL   Hgb urine dipstick LARGE (A) NEGATIVE   Bilirubin Urine NEGATIVE NEGATIVE   Ketones, ur NEGATIVE NEGATIVE mg/dL   Protein, ur 536 (A) NEGATIVE mg/dL   Nitrite NEGATIVE NEGATIVE   Leukocytes,Ua LARGE (A) NEGATIVE   RBC / HPF >50 (H) 0 - 5 RBC/hpf   WBC, UA >50 (H) 0 - 5 WBC/hpf   Bacteria, UA FEW (A) NONE SEEN   Squamous Epithelial / LPF 11-20 0 - 5   Mucus PRESENT   Protein / creatinine ratio, urine     Status: Abnormal   Collection Time: 12/13/18 12:23 PM  Result Value Ref Range   Creatinine, Urine 222.50 mg/dL   Total Protein, Urine 46 mg/dL   Protein Creatinine Ratio 0.21 (H) 0.00 - 0.15 mg/mg[Cre]    Assessment and Plan  -23 y.o. [id number] postpartum patient, currently breastfeeding --Headache resolved with HA cocktail --Normal PEC labs --Urine culture pending --Discharge home in stable condition  F/U: Patient has BP check scheduled for 05/19  Calvert Cantor, CNM 12/13/2018, 2:22 PM

## 2018-12-13 NOTE — MAU Note (Signed)
Pt presents to MAU with a headache that she rates a 10. Denies any blurred vision. Delivered twins on May the 5th. Called physician and was told to come in and be evaluated for her blood pressure. Blood pressure was increasing at the end of her pregnancy

## 2018-12-18 ENCOUNTER — Other Ambulatory Visit: Payer: Self-pay

## 2018-12-18 ENCOUNTER — Ambulatory Visit (INDEPENDENT_AMBULATORY_CARE_PROVIDER_SITE_OTHER): Payer: Medicaid Other

## 2018-12-18 VITALS — BP 132/70

## 2018-12-18 DIAGNOSIS — Z0130 Encounter for examination of blood pressure without abnormal findings: Secondary | ICD-10-CM

## 2018-12-19 NOTE — Progress Notes (Signed)
Pt here today for BP check.  BP 132/70 RA.  Pt denies any pain or bleeding.  Notified Dr. Marice Potter, no new orders.  Pt has pp visit scheduled for 01/08/19.  I informed pt to continue to monitor for signs of HTN and that we f/u with her at her pp visit.  Pt verbalized understanding with no further questions.

## 2018-12-21 NOTE — Progress Notes (Signed)
I have reviewed this chart and agree with the RN/CMA assessment and management.    Arayah Krouse C Taijon Vink, MD, FACOG Attending Physician, [facility]  

## 2019-01-07 ENCOUNTER — Telehealth: Payer: Self-pay

## 2019-01-07 NOTE — Telephone Encounter (Signed)
Called the patient to confirm the change in appointment. Left a detailed voicemail of how to access mychart. Also informed please give our clinic a call back with any questions.

## 2019-01-07 NOTE — Telephone Encounter (Signed)
Called the patient to confirm the appointment via mychart. Left a detailed voicemail message of calling our office for appointment information. The voicemail was not customized for the patient.

## 2019-01-08 ENCOUNTER — Telehealth: Payer: Medicaid Other

## 2019-01-08 ENCOUNTER — Telehealth (INDEPENDENT_AMBULATORY_CARE_PROVIDER_SITE_OTHER): Payer: Medicaid Other

## 2019-01-08 ENCOUNTER — Other Ambulatory Visit: Payer: Self-pay

## 2019-01-08 NOTE — Progress Notes (Signed)
TELEHEALTH VIRTUAL POSTPARTUM VISIT ENCOUNTER NOTE  I connected with@ on 01/08/19 at 11:15 AM EDT by MyChart Virtual Visit at home and verified that I am speaking with the correct person using two identifiers.   I discussed the limitations, risks, security and privacy concerns of performing an evaluation and management service by telephone and the availability of in person appointments. I also discussed with the patient that there may be a patient responsible charge related to this service. The patient expressed understanding and agreed to proceed.  Appointment Date: 01/08/2019  OBGYN Clinic: [facility]  Chief Complaint: Postpartum Visit  History of Present Illness: Autumn Duarte is a 23 y.o. African-American G1P1002 (No LMP recorded.), seen for the above chief complaint. Her past medical history is significant for obesity, eczema and depression   She is s/p normal spontaneous vaginal delivery of twins on 5/5 at 37 weeks; she was discharged to home on PPD#2. Pregnancy complicated by twins, gestational hypertension and postpartum hemorrhage. Babies are doing well.  Complains of n/a  Vaginal bleeding or discharge: No  Mode of feeding infant: Bottle Intercourse: No  Contraception: IUD PP depression s/s: No .  Any bowel or bladder issues: No  Pap smear: not done due to age Review of Systems: Positive for n/a. Her 12 point review of systems is negative or as noted in the History of Present Illness.  Patient Active Problem List   Diagnosis Date Noted  . Morbid obesity with BMI of 50.0-59.9, adult (HCC) 06/11/2018  . 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 PROBLEMS WITH HEARING 11/18/2008  . ECZEMA 04/16/2007    Medications Autumn L. Gaillard had no medications administered during this visit. Current Outpatient Medications  Medication Sig Dispense Refill  . acetaminophen (TYLENOL) 325 MG tablet Take 650 mg by mouth every 6 (six) hours as needed.    . folic acid (FOLVITE) 1 MG tablet  Take 1 tablet (1 mg total) by mouth daily. 60 tablet 3  . ibuprofen (ADVIL) 600 MG tablet Take 1 tablet (600 mg total) by mouth every 6 (six) hours. 30 tablet 0  . Prenatal Vit-Fe Fumarate-FA (PREPLUS) 27-1 MG TABS Take 1 tablet by mouth daily. 60 tablet 3   No current facility-administered medications for this visit.     Allergies Patient has no known allergies.  Physical Exam:  General:  Alert, oriented and cooperative.   Mental Status: Normal mood and affect perceived. Normal judgment and thought content.  Rest of physical exam deferred due to type of encounter  PP Depression Screening:    Assessment:Patient is a 23 y.o. G1P1002 who is 4 weeks postpartum from a normal spontaneous vaginal delivery.  She is doing well.   Plan:  1. Postpartum care and examination -Patient doing well PP -Lengthy discussion about contraception options. Patient desires IUD. Will schedule for IUD placement as soon as possible. Patient also due for first pap smear and can complete with IUD placement. -Patient requesting note to go back to work. Will have note at office to pick up   RTC as soon as possible for IUD placement and pap smear  I discussed the assessment and treatment plan with the patient. The patient was provided an opportunity to ask questions and all were answered. The patient agreed with the plan and demonstrated an understanding of the instructions.   The patient was advised to call back or seek an in-person evaluation/go to the ED for any concerning postpartum symptoms.  I provided 20 minutes of non-face-to-face time  during this encounter.   Wende Mott, [place] for [employer], [place]

## 2019-01-08 NOTE — Progress Notes (Signed)
@  11am LVM

## 2019-01-08 NOTE — Patient Instructions (Signed)
PLEASE TAKE 800mg  IBUPROFEN BEFORE IUD PLACEMENT    Intrauterine Device Information An intrauterine device (IUD) is a medical device that is inserted in the uterus to prevent pregnancy. It is a small, T-shaped device that has one or two nylon strings hanging down from it. The strings hang out of the lower part of the uterus (cervix) to allow for future IUD removal. There are two types of IUDs available:  Hormone IUD. This type of IUD is made of plastic and contains the hormone progestin (synthetic progesterone). A hormone IUD may last 3-5 years.  Copper IUD. This type of IUD has copper wire wrapped around it. A copper IUD may last up to 10 years. How is an IUD inserted? An IUD is inserted through the vagina and placed into the uterus with a minor medical procedure. The exact procedure for IUD insertion may vary among health care providers and hospitals. How does an IUD work? Synthetic progesterone in a hormonal IUD prevents pregnancy by:  Thickening cervical mucus to prevent sperm from entering the uterus.  Thinning the uterine lining to prevent a fertilized egg from being implanted there. Copper in a copper IUD prevents pregnancy by making the uterus and fallopian tubes produce a fluid that kills sperm. What are the advantages of an IUD? Advantages of either type of IUD  It is highly effective in preventing pregnancy.  It is reversible. You can become pregnant shortly after the IUD is removed.  It is low-maintenance and can stay in place for a long time.  There are no estrogen-related side effects.  It can be used when breastfeeding.  It is not associated with weight gain.  It can be inserted right after childbirth, an abortion, or a miscarriage. Advantages of a hormone IUD  If it is inserted within 7 days of your period starting, it works right after it is inserted. If the hormone IUD is inserted at any other time in your cycle, you will need to use a backup method of birth  control for 7 days after insertion.  It can make menstrual periods lighter.  It can reduce menstrual cramping.  It can be used for 3-5 years. Advantages of a copper IUD  It works right after it is inserted.  It can be used as a form of emergency birth control if it is inserted within 5 days after having unprotected sex.  It does not interfere with your body's natural hormones.  It can be used for 10 years. What are the disadvantages of an IUD?  An IUD may cause irregular menstrual bleeding for a period of time after insertion.  You may have pain during insertion and have cramping and vaginal bleeding after insertion.  An IUD may cut the uterus (uterine perforation) when it is inserted. This is rare.  An IUD may cause pelvic inflammatory disease (PID), which is an infection in the uterus and fallopian tubes. This is rare, and it usually happens during the first 20 days after the IUD is inserted.  A copper IUD can make your menstrual flow heavier and more painful. How is an IUD removed?  You will lie on your back with your knees bent and your feet in footrests (stirrups).  A device will be inserted into your vagina to spread apart the vaginal walls (speculum). This will allow your health care provider to see the strings attached to the IUD.  Your health care provider will use a small instrument (forceps) to grasp the IUD strings and pull firmly  until the IUD is removed. You may have some discomfort when the IUD is removed. Your health care provider may recommend taking over-the-counter pain relievers, such as ibuprofen, before the procedure. You may also have minor spotting for a few days after the procedure. The exact procedure for IUD removal may vary among health care providers and hospitals. Is the IUD right for me? Your health care provider will make sure you are a good candidate for an IUD and will discuss the advantages, disadvantages, and possible side effects with you.  Summary  An intrauterine device (IUD) is a medical device that is inserted in the uterus to prevent pregnancy. It is a small, T-shaped device that has one or two nylon strings hanging down from it.  A hormone IUD contains the hormone progestin (synthetic progesterone). A copper IUD has copper wire wrapped around it.  Synthetic progesterone in a hormone IUD prevents pregnancy by thickening cervical mucus and thinning the walls of the uterus. Copper in a copper IUD prevents pregnancy by making the uterus and fallopian tubes produce a fluid that kills sperm.  A hormone IUD can be left in place for 3-5 years. A copper IUD can be left in place for up to 10 years.  An IUD is inserted and removed by a health care provider. You may feel some pain during insertion and removal. Your health care provider may recommend taking over-the-counter pain medicine, such as ibuprofen, before an IUD procedure. This information is not intended to replace advice given to you by your health care provider. Make sure you discuss any questions you have with your health care provider. Document Released: 06/21/2004 Document Revised: 08/16/2016 Document Reviewed: 08/16/2016 Elsevier Interactive Patient Education  2019 [employer].

## 2019-01-10 ENCOUNTER — Telehealth: Payer: Self-pay | Admitting: Obstetrics and Gynecology

## 2019-01-10 NOTE — Telephone Encounter (Signed)
The patient called in to have the appointment for the IUD scheduled as well the letter to return back to work. Informed the patient it will be available in mychart however I will also mail it to her with the appointment reminder. The patient agreed.

## 2019-01-28 ENCOUNTER — Telehealth: Payer: Self-pay | Admitting: Obstetrics and Gynecology

## 2019-01-28 ENCOUNTER — Ambulatory Visit: Payer: Medicaid Other | Admitting: Obstetrics & Gynecology

## 2019-01-28 NOTE — Telephone Encounter (Signed)
Patient was called and instructed about her visit for 01/29/2019. Patient was screened for COVID-19 symptoms and denied any symptoms. Instructed to use the provided hand sanitizer when entering building. Patient was instructed about wearing a mask for their entire visit, and no visitors.  °

## 2019-01-29 ENCOUNTER — Encounter: Payer: Self-pay | Admitting: Obstetrics and Gynecology

## 2019-01-29 ENCOUNTER — Ambulatory Visit (INDEPENDENT_AMBULATORY_CARE_PROVIDER_SITE_OTHER): Payer: Medicaid Other | Admitting: Obstetrics and Gynecology

## 2019-01-29 ENCOUNTER — Other Ambulatory Visit (HOSPITAL_COMMUNITY)
Admission: RE | Admit: 2019-01-29 | Discharge: 2019-01-29 | Disposition: A | Discharge status: Home / Self Care | Payer: Medicaid Other | Source: Ambulatory Visit | Attending: Obstetrics and Gynecology | Admitting: Obstetrics and Gynecology

## 2019-01-29 ENCOUNTER — Other Ambulatory Visit: Payer: Self-pay

## 2019-01-29 VITALS — BP 122/83 | HR 81 | Ht 63.0 in | Wt 299.0 lb

## 2019-01-29 DIAGNOSIS — Z01419 Encounter for gynecological examination (general) (routine) without abnormal findings: Secondary | ICD-10-CM | POA: Insufficient documentation

## 2019-01-29 DIAGNOSIS — Z3202 Encounter for pregnancy test, result negative: Secondary | ICD-10-CM | POA: Diagnosis not present

## 2019-01-29 DIAGNOSIS — Z30430 Encounter for insertion of intrauterine contraceptive device: Secondary | ICD-10-CM

## 2019-01-29 DIAGNOSIS — Z3009 Encounter for other general counseling and advice on contraception: Secondary | ICD-10-CM

## 2019-01-29 DIAGNOSIS — Z124 Encounter for screening for malignant neoplasm of cervix: Secondary | ICD-10-CM

## 2019-01-29 LAB — POCT PREGNANCY, URINE: Preg Test, Ur: NEGATIVE

## 2019-01-29 MED ORDER — LEVONORGESTREL 19.5 MCG/DAY IU IUD
INTRAUTERINE_SYSTEM | Freq: Once | INTRAUTERINE | Status: AC
  Administered 2019-01-29: 09:00:00 via INTRAUTERINE

## 2019-01-29 NOTE — Progress Notes (Signed)
    IUD INSERTION PROCEDURE NOTE  Autumn Duarte is a 23 y.o. [id number] here for Liletta insertion. No GYN concerns.   She was counseled regarding the risks/benefits of IUD including insertion risk of infection, hemorrhage, damage to surrounding tissue and organs, uterine perforation. She was counseled regarding risks of IUD including implantation into uterine wall, malpositioning, misplacement out of the uterus, migration outside of uterus, possible need for hysteroscopic or laparoscopic removal, ovarian cysts, expulsion. She was advised that risk of pregnancy is low with negative UPT but is not zero and IUD insertion may cause miscarriage. Reviewed that she is also at slightly higher risk for ectopic pregnancy and she should take a pregnancy test if she believes she may be pregnant. She was advised to use backup method of protection for one week. She verbalized understanding of all of the above and consent signed.   Last intercourse was yesterday and protected Last pap smear was today UPT today: negative  IUD Insertion  Patient identified and an adequate time out was performed. Speculum placed in the vagina. The cervix was cleaned with Betadine x 2 and grasped anteriorly with a single tooth tenaculum.  A uterine sound was used to sound the uterus to 8 cm;  the IUD was then placed per manufacturer's recommendations. Strings trimmed to 3 cm. Tenaculum was removed, good hemostasis noted with pressure. Patient tolerated procedure well.   Patient was given post-procedure instructions.  She was reminded to have backup contraception for one week during this transition period between IUDs.  Patient was also asked to check IUD strings periodically and follow up in 4 weeks for IUD check.  Liletta IUD Exp: 07/2022 Lot: [id number]  K. Arvilla Meres, M.D. Attending Center for [employer] farm manager)

## 2019-01-29 NOTE — Progress Notes (Signed)
   GYNECOLOGY OFFICE FOLLOW UP NOTE  History:  23 y.o. [id number] here today for follow up for contraception counseling, IUD insertion and pap smear. She is doing well with no acute issues. Had had intercourse in last two weeks, all protected.     Past Medical History:  Diagnosis Date  . Asthma    last used inhaler 2 months ago  . Bronchitis   . Eczema   . Obesity   . Seasonal allergies     Past Surgical History:  Procedure Laterality Date  . WISDOM TOOTH EXTRACTION       Current Outpatient Medications:  .  ibuprofen (ADVIL) 600 MG tablet, Take 1 tablet (600 mg total) by mouth every 6 (six) hours., Disp: 30 tablet, Rfl: 0  The following portions of the patient's history were reviewed and updated as appropriate: allergies, current medications, past family history, past medical history, past social history, past surgical history and problem list.   Review of Systems:  Pertinent items noted in HPI and remainder of comprehensive ROS otherwise negative.   Objective:  Physical Exam BP 122/83   Pulse 81   Ht 5\' 3"  (1.6 m)   Wt 299 lb (135.6 kg)   LMP 01/10/2019 (Within Days)   Breastfeeding No   BMI 52.97 kg/m  CONSTITUTIONAL: Well-developed, well-nourished female in no acute distress.  HENT:  Normocephalic, atraumatic. External right and left ear normal. Oropharynx is clear and moist EYES: Conjunctivae and EOM are normal. Pupils are equal, round, and reactive to light. No scleral icterus.  NECK: Normal range of motion, supple, no masses SKIN: Skin is warm and dry. No rash noted. Not diaphoretic. No erythema. No pallor. NEUROLOGIC: Alert and oriented to person, place, and time. Normal reflexes, muscle tone coordination. No cranial nerve deficit noted. PSYCHIATRIC: Normal mood and affect. Normal behavior. Normal judgment and thought content. CARDIOVASCULAR: Normal heart rate noted RESPIRATORY: Effort normal, no problems with respiration noted ABDOMEN: Soft, no distention noted.    PELVIC: Normal appearing external genitalia; normal appearing vaginal mucosa and cervix.  No abnormal discharge noted.  Pap smear obtained.  Normal uterine size, no other palpable masses, no uterine or adnexal tenderness. MUSCULOSKELETAL: Normal range of motion. No edema noted.  Exam done with chaperone present.  Labs and Imaging No results found.  Assessment & Plan:   1. Encounter for IUD insertion Please see separate procedure note  2. Pap smear for cervical cancer screening - Cytology - PAP( [place])  3. Encounter for counseling regarding contraception Patient here for IUD insertion, not sure about what kind. Reviewed at length risks/benefits of hormonal vs non-hormonal IUDs. Emphasized benefits of both methods. Answered all questions. She would like hormonal IUD.   Routine preventative health maintenance measures emphasized. Please refer to After Visit Summary for other counseling recommendations.   Return in about 1 year (around 01/29/2020) for annual.   Feliz Beam, M.D. Attending Center for [employer] Fish farm manager)

## 2019-01-30 LAB — CYTOLOGY - PAP: Diagnosis: NEGATIVE

## 2019-02-05 ENCOUNTER — Encounter: Payer: Self-pay | Admitting: *Deleted

## 2019-02-27 ENCOUNTER — Ambulatory Visit: Payer: Medicaid Other | Admitting: Student

## 2019-03-01 ENCOUNTER — Telehealth: Payer: Self-pay | Admitting: Obstetrics & Gynecology

## 2019-03-01 NOTE — Telephone Encounter (Signed)
Called the patient to inform of the upcoming appointment, the patient answered and disconnected the call. Called the patient a second time and left a voicemail message.  Called the patient to inform of the appointment. Left a detailed voicemail stating if she has been in contact with someone who has had covid-19, waiting on test results for covid-19, or has been diagnosed with covid-19 please call to reschedule the appointment. Also, if youve had any flu-like symptoms such as fever, rash, sore throat or shortness of breath please call our office to reschedule. If you answered no to previous questions, please also note that at this time no children or visitors are allowed due to covid-19 restrictions. If you have any questions or concerns, please call our office.

## 2019-03-05 ENCOUNTER — Encounter: Payer: Self-pay | Admitting: Student

## 2019-03-05 ENCOUNTER — Other Ambulatory Visit: Payer: Self-pay

## 2019-03-05 ENCOUNTER — Ambulatory Visit (INDEPENDENT_AMBULATORY_CARE_PROVIDER_SITE_OTHER): Payer: Medicaid Other | Admitting: Student

## 2019-03-05 DIAGNOSIS — Z30431 Encounter for routine checking of intrauterine contraceptive device: Secondary | ICD-10-CM | POA: Diagnosis not present

## 2019-03-05 NOTE — Progress Notes (Signed)
History:  Autumn Duarte is a 23 y.o. G1P1002 who presents to clinic today for string check. Patient reports that she is not having any pain, just some light bleeding that is inconsistent. She is happy with her IUD.    The following portions of the patient's history were reviewed and updated as appropriate: allergies, current medications, family history, past medical history, social history, past surgical history and problem list.  Review of Systems:  Review of Systems  Constitutional: Negative.   HENT: Negative.   Respiratory: Negative.   Cardiovascular: Negative.   Genitourinary: Negative.   Musculoskeletal: Negative.   Skin: Negative.       Objective:  Physical Exam BP (!) 153/95   Pulse (!) 103   Wt (!) 305 lb (138.3 kg)   BMI 54.03 kg/m  Physical Exam  Constitutional: She appears well-developed.  HENT:  Head: Normocephalic.  Neck: Normal range of motion.  GI: Soft.  Genitourinary:    Vagina normal.     Genitourinary Comments: NEFG; trace amount of light pink blood in the vagina; cervix is pink with no lesions, IUD strings visualized. No CMT, suprapubic or adnexal tenderness.    Musculoskeletal: Normal range of motion.  Neurological: She is alert.  Skin: Skin is warm and dry.      Labs and Imaging No results found for this or any previous visit (from the past 24 hour(s)).  No results found.   Assessment & Plan:   1. IUD check up    2. Patient stable for discharge with recommendation to come back in 1 year for well-woman. Pap UTD.  Starr Lake, CNM 03/05/2019 11:31 AM

## 2019-05-09 ENCOUNTER — Telehealth: Payer: Self-pay | Admitting: Family Medicine

## 2019-05-09 NOTE — Telephone Encounter (Signed)
Reviewed and noted.

## 2019-05-09 NOTE — Telephone Encounter (Signed)
Completed a referral for counseling per mother's request to address symptoms of Post-Partum Depression.

## 2019-05-09 NOTE — Telephone Encounter (Signed)
HSS called client at request of children's PCP to discuss her request for resources for post-partum depression. HSS discussed current symptoms and severity. Mother reports she had been doing better but still has moments of feeling very overwhelmed and that sometimes her coping strategies do not work for helping her in the moment. HSS explored social supports and completed risk assessment. Mother has been getting good support from an extended family member recently. Discussed additional strategies that could potentially be used for times when she is feeling overwhelmed. HSS encouraged mother to touch base with her PCP or OB to discuss possible medical supports. Explored mother's interest in counseling. Mother reports she is willing to explore and requested a referral. HSS will complete a referral for counseling for mother and encouraged her to contact HSS if she does not hear from anyone. Mother expressed understanding. HSS will also mail information on [employer]um [employer] Support organization as well as mother reports she did not receive it when it was sent previously.

## 2019-05-10 ENCOUNTER — Ambulatory Visit: Payer: Medicaid Other | Admitting: Obstetrics and Gynecology

## 2019-05-10 ENCOUNTER — Other Ambulatory Visit: Payer: Self-pay | Admitting: Lactation Services

## 2019-05-10 DIAGNOSIS — F53 Postpartum depression: Secondary | ICD-10-CM

## 2019-05-10 NOTE — Progress Notes (Signed)
ambul;a 

## 2019-05-22 NOTE — BH Specialist Note (Signed)
Pt did not arrive to video visit and did not answer the phone; Left HIPPA-compliant message to call back Roselyn Reef from [facility] [employer] at [phone], and left MyChart message for patient.   [place] via Telemedicine Video Visit  05/22/2019 LEXYS MILLINER [phone]  Garlan Fair

## 2019-05-23 ENCOUNTER — Other Ambulatory Visit: Payer: Self-pay

## 2019-05-23 ENCOUNTER — Ambulatory Visit: Payer: Medicaid Other | Admitting: Clinical

## 2019-05-23 ENCOUNTER — Encounter: Payer: Self-pay | Admitting: Obstetrics & Gynecology

## 2019-05-23 DIAGNOSIS — Z5329 Procedure and treatment not carried out because of patient's decision for other reasons: Secondary | ICD-10-CM

## 2019-05-23 DIAGNOSIS — Z91199 Patient's noncompliance with other medical treatment and regimen due to unspecified reason: Secondary | ICD-10-CM

## 2019-08-19 ENCOUNTER — Ambulatory Visit: Payer: Medicaid Other | Admitting: Nurse Practitioner

## 2019-09-24 ENCOUNTER — Ambulatory Visit: Payer: Medicaid Other | Admitting: Student

## 2019-11-18 DIAGNOSIS — R7303 Prediabetes: Secondary | ICD-10-CM | POA: Insufficient documentation

## 2020-01-22 ENCOUNTER — Other Ambulatory Visit: Payer: Self-pay

## 2020-01-22 ENCOUNTER — Encounter (HOSPITAL_COMMUNITY): Payer: Self-pay | Admitting: Emergency Medicine

## 2020-01-22 ENCOUNTER — Emergency Department (HOSPITAL_COMMUNITY)
Admission: EM | Admit: 2020-01-22 | Discharge: 2020-01-23 | Disposition: A | Discharge status: Home / Self Care | Payer: Medicaid Other | Attending: Emergency Medicine | Admitting: Emergency Medicine

## 2020-01-22 ENCOUNTER — Emergency Department (HOSPITAL_COMMUNITY): Payer: Medicaid Other

## 2020-01-22 DIAGNOSIS — R05 Cough: Secondary | ICD-10-CM | POA: Diagnosis not present

## 2020-01-22 DIAGNOSIS — J029 Acute pharyngitis, unspecified: Secondary | ICD-10-CM | POA: Insufficient documentation

## 2020-01-22 DIAGNOSIS — R509 Fever, unspecified: Secondary | ICD-10-CM | POA: Insufficient documentation

## 2020-01-22 DIAGNOSIS — Z5321 Procedure and treatment not carried out due to patient leaving prior to being seen by health care provider: Secondary | ICD-10-CM | POA: Insufficient documentation

## 2020-01-22 DIAGNOSIS — M7918 Myalgia, other site: Secondary | ICD-10-CM | POA: Diagnosis not present

## 2020-01-22 LAB — COMPREHENSIVE METABOLIC PANEL
ALT: 31 U/L (ref 0–44)
AST: 24 U/L (ref 15–41)
Albumin: 3.6 g/dL (ref 3.5–5.0)
Alkaline Phosphatase: 33 U/L — ABNORMAL LOW (ref 38–126)
Anion gap: 11 (ref 5–15)
BUN: 7 mg/dL (ref 6–20)
CO2: 21 mmol/L — ABNORMAL LOW (ref 22–32)
Calcium: 9.3 mg/dL (ref 8.9–10.3)
Chloride: 105 mmol/L (ref 98–111)
Creatinine, Ser: 0.84 mg/dL (ref 0.44–1.00)
GFR calc Af Amer: >60 mL/min (ref >60)
GFR calc non Af Amer: >60 mL/min (ref >60)
Glucose, Bld: 96 mg/dL (ref 70–99)
Potassium: 3.6 mmol/L (ref 3.5–5.1)
Sodium: 137 mmol/L (ref 135–145)
Total Bilirubin: 0.7 mg/dL (ref 0.3–1.2)
Total Protein: 7.8 g/dL (ref 6.5–8.1)

## 2020-01-22 LAB — CBC WITH DIFFERENTIAL/PLATELET
Abs Immature Granulocytes: 0.04 10*3/uL (ref 0.00–0.07)
Basophils Absolute: 0 10*3/uL (ref 0.0–0.1)
Basophils Relative: 0 %
Eosinophils Absolute: 0 10*3/uL (ref 0.0–0.5)
Eosinophils Relative: 0 %
HCT: 42 % (ref 36.0–46.0)
Hemoglobin: 13.7 g/dL (ref 12.0–15.0)
Immature Granulocytes: 0 %
Lymphocytes Relative: 18 %
Lymphs Abs: 1.8 10*3/uL (ref 0.7–4.0)
MCH: 28.1 pg (ref 26.0–34.0)
MCHC: 32.6 g/dL (ref 30.0–36.0)
MCV: 86.2 fL (ref 80.0–100.0)
Monocytes Absolute: 0.4 10*3/uL (ref 0.1–1.0)
Monocytes Relative: 4 %
Neutro Abs: 8 10*3/uL — ABNORMAL HIGH (ref 1.7–7.7)
Neutrophils Relative %: 78 %
Platelets: 305 10*3/uL (ref 150–400)
RBC: 4.87 MIL/uL (ref 3.87–5.11)
RDW: 14.6 % (ref 11.5–15.5)
WBC: 10.4 10*3/uL (ref 4.0–10.5)
nRBC: 0 % (ref 0.0–0.2)

## 2020-01-22 LAB — URINALYSIS, ROUTINE W REFLEX MICROSCOPIC
Bilirubin Urine: NEGATIVE
Glucose, UA: NEGATIVE mg/dL
Ketones, ur: NEGATIVE mg/dL
Nitrite: NEGATIVE
Protein, ur: 30 mg/dL — AB
RBC / HPF: >50 RBC/hpf — ABNORMAL HIGH (ref 0–5)
Specific Gravity, Urine: 1.026 (ref 1.005–1.030)
pH: 7 (ref 5.0–8.0)

## 2020-01-22 LAB — I-STAT BETA HCG BLOOD, ED (MC, WL, AP ONLY): I-stat hCG, quantitative: <5 m[IU]/mL (ref <5)

## 2020-01-22 MED ORDER — SODIUM CHLORIDE 0.9% FLUSH: 3.0000 mL | Freq: Once | INTRAVENOUS | Status: DC

## 2020-01-22 NOTE — ED Triage Notes (Addendum)
Patient arrives to ED with complaints of sore throat, body aches, and chills for the last two days. Patient states that she does have a cough and has a hard time taking deep breaths. Patient have fever and tachycardic in triage. Denies dysuria.

## 2020-01-23 NOTE — ED Notes (Signed)
Pt has been called X3 for vitals check with no response

## 2020-01-29 DIAGNOSIS — F4323 Adjustment disorder with mixed anxiety and depressed mood: Secondary | ICD-10-CM | POA: Insufficient documentation

## 2020-04-07 ENCOUNTER — Other Ambulatory Visit: Payer: Self-pay

## 2020-04-07 ENCOUNTER — Encounter (HOSPITAL_BASED_OUTPATIENT_CLINIC_OR_DEPARTMENT_OTHER): Payer: Self-pay | Admitting: Emergency Medicine

## 2020-04-07 DIAGNOSIS — Z79899 Other long term (current) drug therapy: Secondary | ICD-10-CM | POA: Insufficient documentation

## 2020-04-07 DIAGNOSIS — R590 Localized enlarged lymph nodes: Secondary | ICD-10-CM | POA: Diagnosis not present

## 2020-04-07 DIAGNOSIS — J069 Acute upper respiratory infection, unspecified: Secondary | ICD-10-CM | POA: Diagnosis not present

## 2020-04-07 DIAGNOSIS — R0602 Shortness of breath: Secondary | ICD-10-CM | POA: Insufficient documentation

## 2020-04-07 DIAGNOSIS — R05 Cough: Secondary | ICD-10-CM | POA: Diagnosis present

## 2020-04-07 DIAGNOSIS — Z87891 Personal history of nicotine dependence: Secondary | ICD-10-CM | POA: Insufficient documentation

## 2020-04-07 DIAGNOSIS — Z20822 Contact with and (suspected) exposure to covid-19: Secondary | ICD-10-CM | POA: Diagnosis not present

## 2020-04-07 DIAGNOSIS — J45909 Unspecified asthma, uncomplicated: Secondary | ICD-10-CM | POA: Diagnosis not present

## 2020-04-07 NOTE — ED Triage Notes (Signed)
Pt c/o cough and congestion x 3 days ?

## 2020-04-08 ENCOUNTER — Emergency Department (HOSPITAL_BASED_OUTPATIENT_CLINIC_OR_DEPARTMENT_OTHER)
Admission: EM | Admit: 2020-04-08 | Discharge: 2020-04-08 | Disposition: A | Discharge status: Home / Self Care | Payer: Medicaid Other | Attending: Emergency Medicine | Admitting: Emergency Medicine

## 2020-04-08 DIAGNOSIS — J069 Acute upper respiratory infection, unspecified: Secondary | ICD-10-CM

## 2020-04-08 LAB — SARS CORONAVIRUS 2 BY RT PCR (HOSPITAL ORDER, PERFORMED IN ~~LOC~~ HOSPITAL LAB): SARS Coronavirus 2: NEGATIVE

## 2020-04-08 MED ORDER — BENZONATATE 100 MG PO CAPS: 100.0000 mg | ORAL_CAPSULE | Freq: Three times a day (TID) | ORAL | 0 refills | Status: DC

## 2020-04-08 MED ORDER — DEXAMETHASONE 6 MG PO TABS
10.0000 mg | ORAL_TABLET | Freq: Once | ORAL | Status: AC
  Administered 2020-04-08: 10 mg via ORAL
  Filled 2020-04-08: qty 1

## 2020-04-08 NOTE — ED Provider Notes (Signed)
[facility] EMERGENCY DEPARTMENT Provider Note   CSN: [id number] Arrival date & time: 04/07/20  2336     History Chief Complaint  Patient presents with  . URI    Autumn Duarte is a 24 y.o. female.  HPI     This is a 24 year old female with a history of asthma who presents with cough, congestion, shortness of breath.  Patient reports she has 2 daughters at home who had similar symptoms.  Symptoms started on Saturday.  She not had any fever.  She reports a dry cough and some shortness of breath.  She has been using her inhaler with minimal relief.  She has not been in contact with anyone who has had COVID-19 that she knows of.  She is fully vaccinated.  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  Past Medical History:  Diagnosis Date  . Asthma    last used inhaler 2 months ago  . Bronchitis   . Eczema   . Obesity   . Seasonal allergies     Patient Active Problem List   Diagnosis Date Noted  . IUD check up 03/05/2019  . Morbid obesity with BMI of 50.0-59.9, adult (HCC) 06/11/2018  . 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 PROBLEMS WITH HEARING 11/18/2008  . ECZEMA 04/16/2007    Past Surgical History:  Procedure Laterality Date  . WISDOM TOOTH EXTRACTION       OB History    Gravida  1   Para  1   Term  1   Preterm      AB      Living  2     SAB      TAB      Ectopic      Multiple  1   Live Births  2           Family History  Problem Relation Age of Onset  . Arthritis Mother   . Hepatitis Mother   . Thyroid disease Mother   . Diabetes Father   . Heart disease Father   . Hypertension Father   . Breast cancer Maternal Aunt   . Breast cancer Maternal Grandmother     Social History   Tobacco Use  . Smoking status: Former Smoker    Types: Cigarettes    Quit date: 03/19/2018    Years since quitting: 2.0  . Smokeless tobacco: Never Used  Substance Use Topics  .  Alcohol use: Never  . Drug use: Never    Home Medications Prior to Admission medications   Medication Sig Start Date End Date Taking? Authorizing Provider  sertraline (ZOLOFT) 25 MG tablet Take by mouth. 01/29/20  Yes [provider]  benzonatate (TESSALON) 100 MG capsule Take 1 capsule (100 mg total) by mouth every 8 (eight) hours. 04/08/20   [name], Mayer Masker, MD    Allergies    Patient has no known allergies.  Review of Systems   Review of Systems  Constitutional: Negative for fever.  HENT: Positive for congestion.   Respiratory: Positive for cough and shortness of breath.   Cardiovascular: Negative for chest pain.  Gastrointestinal: Negative for abdominal pain, nausea and vomiting.  All other systems reviewed and are negative.   Physical Exam Updated Vital Signs BP (!) 121/54 (BP Location: Right Arm)   Pulse (!) 104   Temp 98.2 F (36.8 C) (Oral)   Resp 16   Ht 1.626 m (  5\' 4" )   Wt (!) 143.8 kg   SpO2 98%   BMI 54.41 kg/m   Physical Exam Vitals and nursing note reviewed.  Constitutional:      Appearance: She is well-developed. She is obese. She is not ill-appearing.  HENT:     Head: Normocephalic and atraumatic.     Nose: Congestion present.     Mouth/Throat:     Mouth: Mucous membranes are moist.     Pharynx: No oropharyngeal exudate.  Eyes:     Pupils: Pupils are equal, round, and reactive to light.  Cardiovascular:     Rate and Rhythm: Normal rate and regular rhythm.     Heart sounds: Normal heart sounds.  Pulmonary:     Effort: Pulmonary effort is normal. No respiratory distress.     Breath sounds: No wheezing.     Comments: Distant breath sounds but clear, no active wheezing noted Abdominal:     General: Bowel sounds are normal.     Palpations: Abdomen is soft.  Musculoskeletal:     Cervical back: Neck supple.     Right lower leg: No edema.     Left lower leg: No edema.  Lymphadenopathy:     Cervical: Cervical adenopathy present.   Skin:    General: Skin is warm and dry.  Neurological:     Mental Status: She is alert and oriented to person, place, and time.  Psychiatric:        Mood and Affect: Mood normal.     ED Results / Procedures / Treatments   Labs (all labs ordered are listed, but only abnormal results are displayed) Labs Reviewed  SARS CORONAVIRUS 2 BY RT PCR (HOSPITAL ORDER, PERFORMED IN [facility])    EKG None  Radiology No results found.  Procedures Procedures (including critical care time)  Medications Ordered in ED Medications  dexamethasone (DECADRON) tablet 10 mg (has no administration in time range)    ED Course  I have reviewed the triage vital signs and the nursing notes.  Pertinent labs & imaging results that were available during my care of the patient were reviewed by me and considered in my medical decision making (see chart for details).    MDM Rules/Calculators/A&P                          Patient presents with upper respiratory symptoms including congestion and ongoing cough.  History of asthma.  She is overall nontoxic-appearing and vital signs are largely reassuring.  She is in no respiratory distress.  She states that the most concerning thing is her asthma and cough.  She is clear on exam.  No fevers.  Doubt pneumonia.  Suspect viral upper respiratory infection.  Given prevalence of COVID-19 in the community, Covid testing was sent and was negative.  Patient was given a dose of Decadron.  Recommend continued inhalers and Tessalon Perles for symptomatic control.  After history, exam, and medical workup I feel the patient has been appropriately medically screened and is safe for discharge home. Pertinent diagnoses were discussed with the patient. Patient was given return precautions.   Autumn Duarte was evaluated in Emergency Department on 04/08/2020 for the symptoms described in the history of present illness. She was evaluated in the context of the global  COVID-19 pandemic, which necessitated consideration that the patient might be at risk for infection with the SARS-CoV-2 virus that causes COVID-19. Institutional protocols and algorithms that pertain  to the evaluation of patients at risk for COVID-19 are in a state of rapid change based on information released by regulatory bodies including the CDC and federal and state organizations. These policies and algorithms were followed during the patient's care in the ED.  Final Clinical Impression(s) / ED Diagnoses Final diagnoses:  Viral URI with cough    Rx / DC Orders ED Discharge Orders         Ordered    benzonatate (TESSALON) 100 MG capsule  Every 8 hours        04/08/20 0448           Lesslie Mckeehan, Mayer Masker, MD 04/08/20 [phone]

## 2020-04-08 NOTE — Discharge Instructions (Addendum)
You were seen today for upper respiratory symptoms.  Given your history of asthma, continue your inhaler at home.  You were given a dose of steroids.  Take Tessalon Perles as needed for your cough.

## 2020-04-24 IMAGING — US US OB EACH ADDL GEST<[ID]
1 series · 15 of 28 positions shown · non-contrast
Comparison: None.

CLINICAL DATA: LEFT lower quadrant pain. Positive pregnancy test.
Gestational age by last menstrual period 10 weeks and 5 days. Beta
HCG [DATE].

EXAM:
TWIN OBSTETRICAL ULTRASOUND <14 WKS

[Series 1: us ob each addl gest<(id) · 15 of 43 slices shown]
[im 1/43]
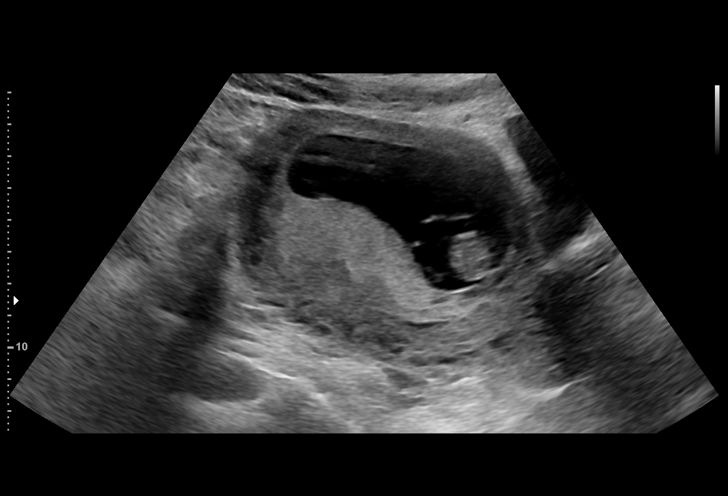
[im 4/43]
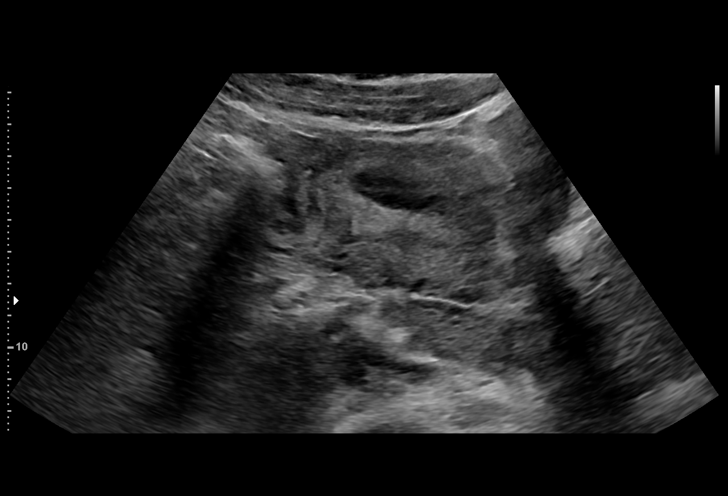
[im 7/43]
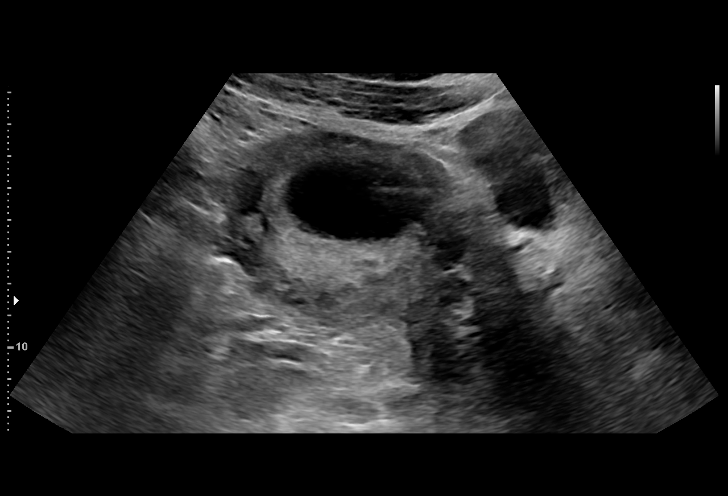
[im 10/43]
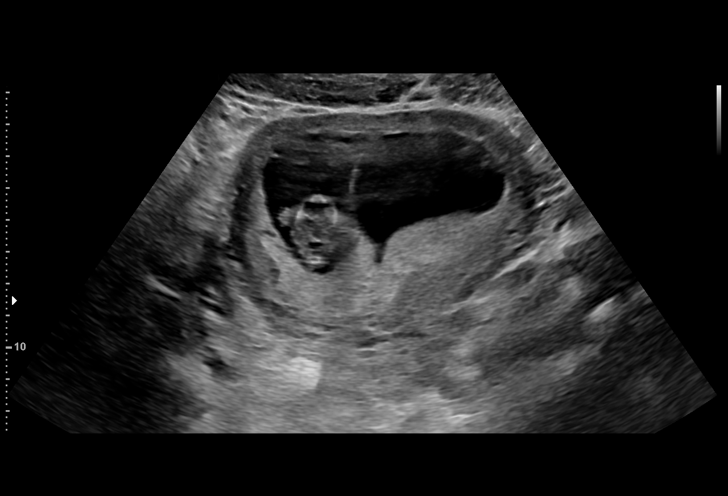
[im 13/43]
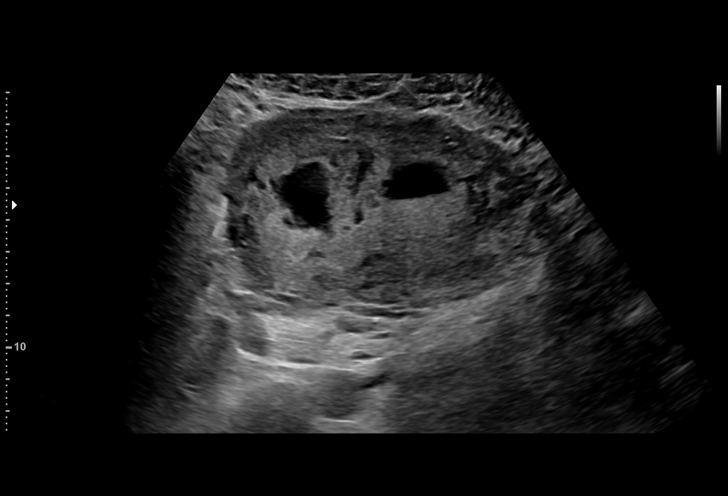
[im 16/43]
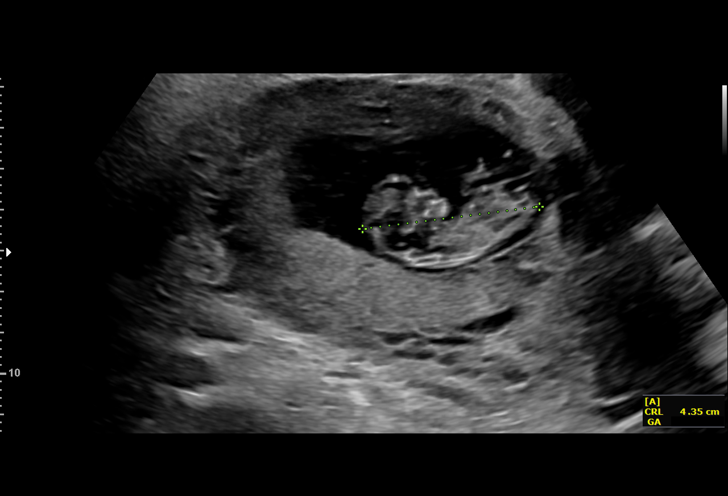
[im 19/43]
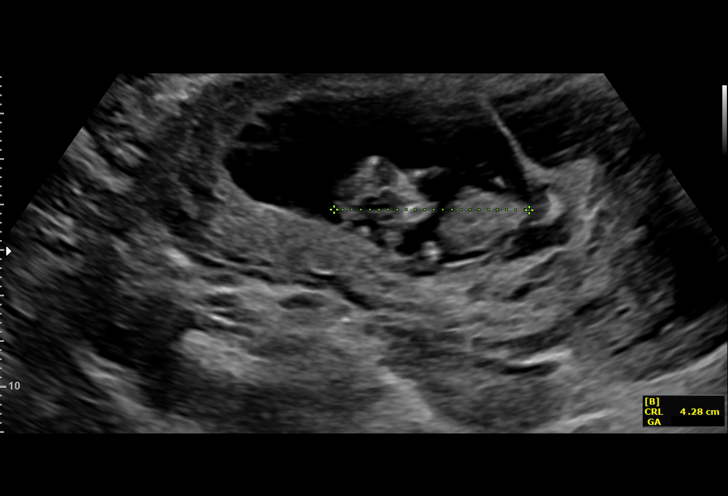
[im 22/43]
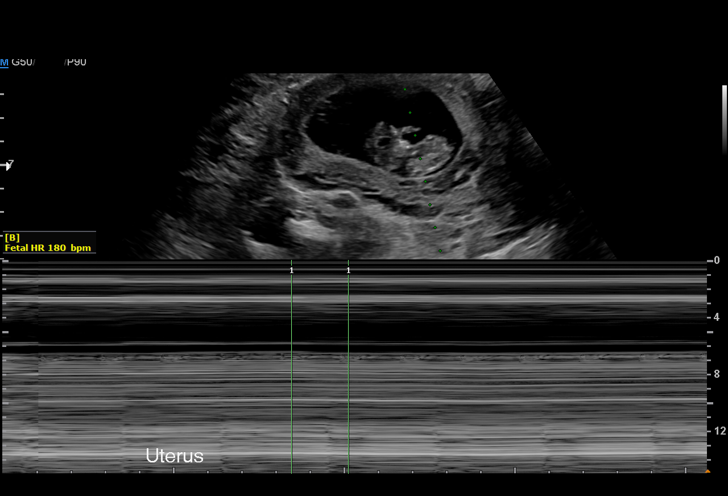
[im 24/43]
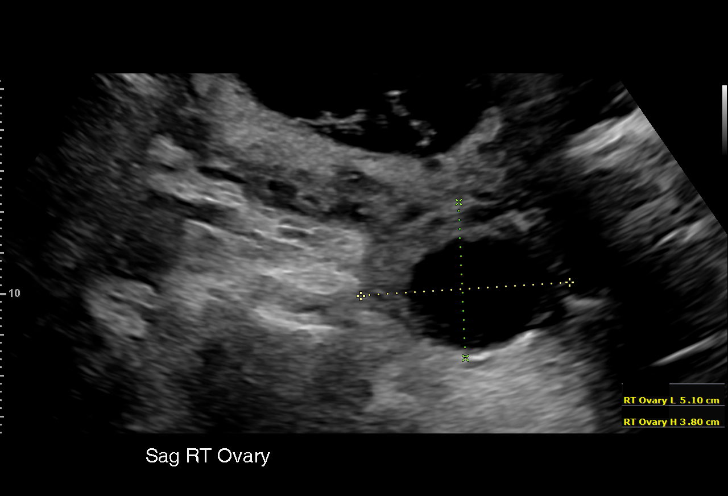
[im 27/43]
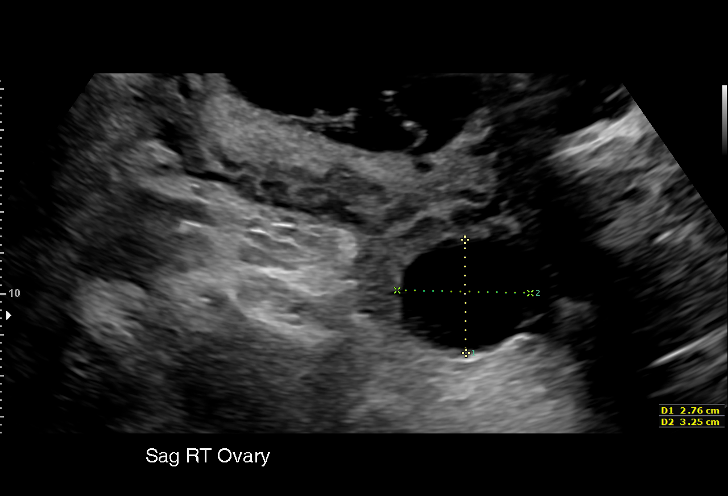
[im 30/43]
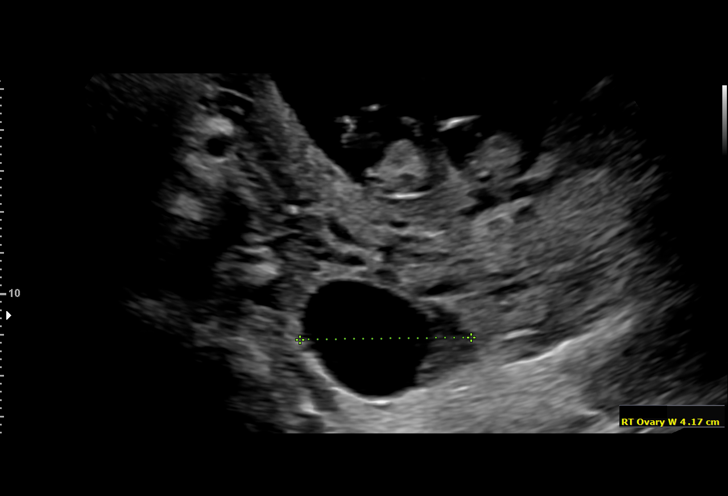
[im 33/43]
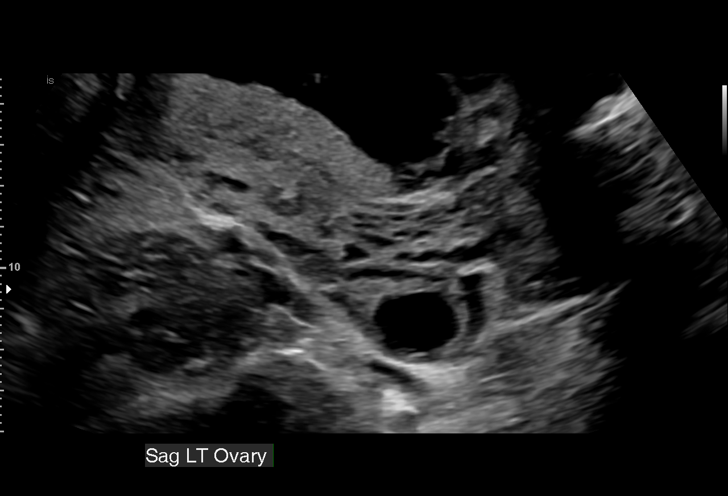
[im 36/43]
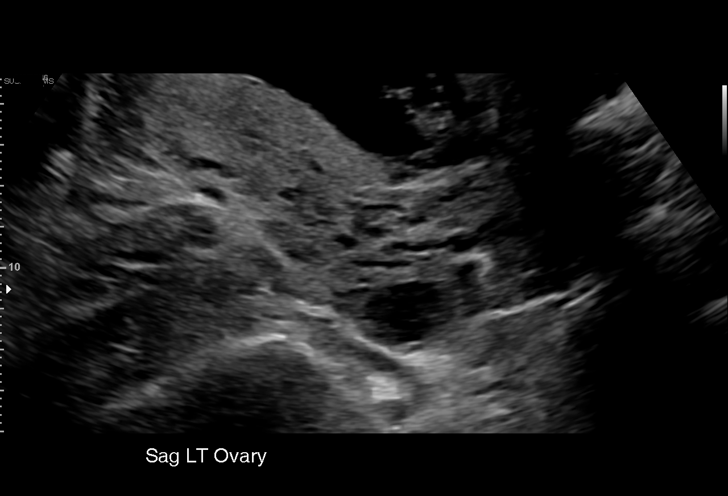
[im 39/43]
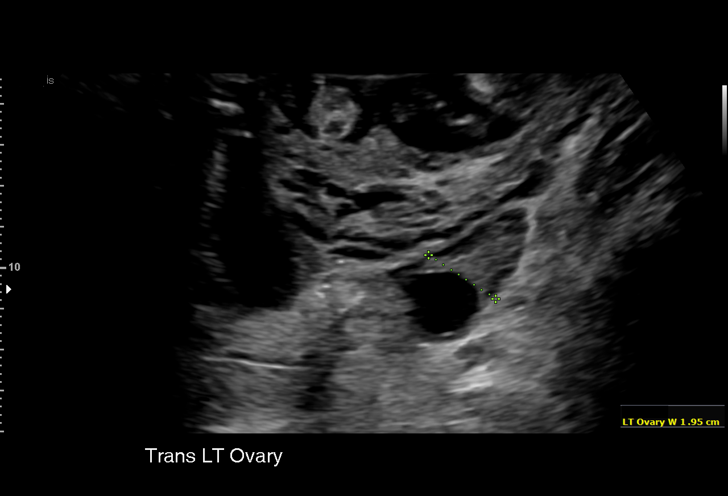
[im 43/43]
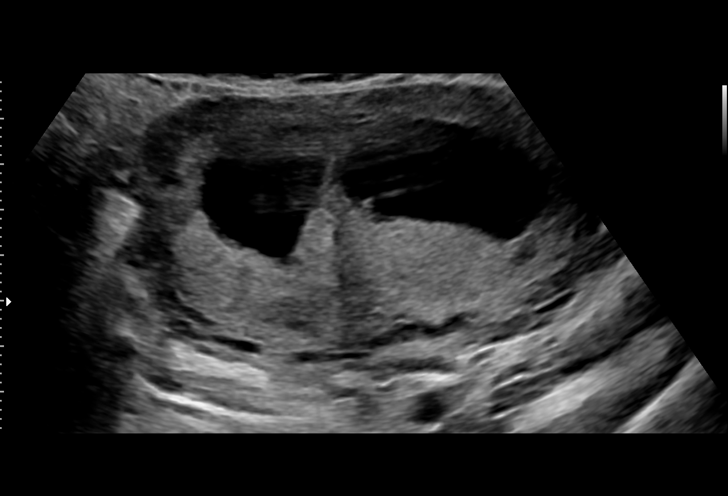

[15 of 28 positions shown; findings below may reference images not displayed]

FINDINGS: Number of IUPs:  2

Chorionicity/Amnionicity:  Dichorionic/diamniotic

TWIN 1

Yolk sac:  Not present

Embryo:  Present

Cardiac Activity: Present

Heart Rate: 161 bpm

CRL: 43 mm   11 w 1 d                  US EDC: December 21, 2018

With

TWIN 2

Yolk sac:  Not present

Embryo:  Present

Cardiac Activity: Present

Heart Rate: 180 bpm

CRL:   44 mm   11 w 1 d                  US EDC: December 21, 2018

Subchorionic hemorrhage:  None visualized.

Maternal uterus/adnexae: Trace free fluid. 3.3 cm RIGHT anechoic
simple cyst. 2 cm LEFT corpus luteal cyst.
IMPRESSION: Twin live intrauterine pregnancies; gestational age by ultrasound 11
weeks and 1 day without an immediate complication.

## 2020-06-23 ENCOUNTER — Encounter: Payer: Self-pay | Admitting: Obstetrics and Gynecology

## 2020-06-23 ENCOUNTER — Ambulatory Visit (INDEPENDENT_AMBULATORY_CARE_PROVIDER_SITE_OTHER): Payer: Medicaid Other | Admitting: Obstetrics and Gynecology

## 2020-06-23 ENCOUNTER — Other Ambulatory Visit: Payer: Self-pay

## 2020-06-23 VITALS — BP 119/85 | HR 122 | Wt 308.0 lb

## 2020-06-23 DIAGNOSIS — Z30431 Encounter for routine checking of intrauterine contraceptive device: Secondary | ICD-10-CM | POA: Diagnosis not present

## 2020-06-23 NOTE — Progress Notes (Signed)
24 yo here for IUD check. Patient is sexually active without complaints but reports occasional sharp vaginal pain and she is able to feel the stings. She reports a monthly light period. Patient is without any other complaints  Past Medical History:  Diagnosis Date  . Asthma    last used inhaler 2 months ago  . Bronchitis   . Eczema   . Obesity   . Seasonal allergies    Past Surgical History:  Procedure Laterality Date  . WISDOM TOOTH EXTRACTION     Family History  Problem Relation Age of Onset  . Arthritis Mother   . Hepatitis Mother   . Thyroid disease Mother   . Diabetes Father   . Heart disease Father   . Hypertension Father   . Breast cancer Maternal Aunt   . Breast cancer Maternal Grandmother    Social History   Tobacco Use  . Smoking status: Former Smoker    Types: Cigarettes    Quit date: 03/19/2018    Years since quitting: 2.2  . Smokeless tobacco: Never Used  Substance Use Topics  . Alcohol use: Never  . Drug use: Never   ROS See pertinent in HPI. All other systems reviewed and negative  Blood pressure 119/85, pulse (!) 122, weight (!) 308 lb (139.7 kg), last menstrual period 05/13/2020, not currently breastfeeding. GENERAL: Well-developed, well-nourished female in no acute distress.  ABDOMEN: Soft, nontender, nondistended. No organomegaly. PELVIC: Normal external female genitalia. Vagina is pink and rugated.  Normal discharge. Normal appearing cervix. IUD strings visualized and approximately 4 cm. Strings trimmed to 2 cm. Uterus is normal in size.  No adnexal mass or tenderness. EXTREMITIES: No cyanosis, clubbing, or edema, 2+ distal pulses.  A/P 24 yo here for IUD check - IUD appears to be in the appropriate location - Strings trimmed - Normal pap smear 01/29/19 - RTC prn

## 2020-08-18 ENCOUNTER — Ambulatory Visit: Payer: Medicaid Other | Admitting: Advanced Practice Midwife

## 2020-12-21 ENCOUNTER — Ambulatory Visit: Payer: Medicaid Other | Admitting: Nurse Practitioner

## 2020-12-21 ENCOUNTER — Emergency Department (HOSPITAL_BASED_OUTPATIENT_CLINIC_OR_DEPARTMENT_OTHER): Payer: Medicaid Other

## 2020-12-21 ENCOUNTER — Other Ambulatory Visit: Payer: Self-pay

## 2020-12-21 ENCOUNTER — Emergency Department (HOSPITAL_BASED_OUTPATIENT_CLINIC_OR_DEPARTMENT_OTHER)
Admission: EM | Admit: 2020-12-21 | Discharge: 2020-12-21 | Disposition: A | Discharge status: Home / Self Care | Payer: Medicaid Other | Attending: Emergency Medicine | Admitting: Emergency Medicine

## 2020-12-21 ENCOUNTER — Encounter (HOSPITAL_BASED_OUTPATIENT_CLINIC_OR_DEPARTMENT_OTHER): Payer: Self-pay | Admitting: Emergency Medicine

## 2020-12-21 DIAGNOSIS — M79601 Pain in right arm: Secondary | ICD-10-CM | POA: Diagnosis not present

## 2020-12-21 DIAGNOSIS — Z87891 Personal history of nicotine dependence: Secondary | ICD-10-CM | POA: Diagnosis not present

## 2020-12-21 DIAGNOSIS — J45909 Unspecified asthma, uncomplicated: Secondary | ICD-10-CM | POA: Diagnosis not present

## 2020-12-21 MED ORDER — NAPROXEN 500 MG PO TABS: 500.0000 mg | ORAL_TABLET | Freq: Two times a day (BID) | ORAL | 0 refills | Status: DC

## 2020-12-21 NOTE — ED Provider Notes (Signed)
[facility] EMERGENCY DEPARTMENT Provider Note   CSN: [id number] Arrival date & time: 12/21/20  0016     History Chief Complaint  Patient presents with  . R arm pain    Autumn Duarte is a 25 y.o. female.  Patient is a 25 year old female with history of asthma.  She presents today for evaluation of right arm pain.  She describes a 2-week history of persistent pain in the posterior aspect of her arm just distal to the elbow.  She denies any numbness or tingling.  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  Pain is worse with range of motion of the elbow and relieved somewhat with rest.  She has not tried taking any medications for this.  The history is provided by the patient.       Past Medical History:  Diagnosis Date  . Asthma    last used inhaler 2 months ago  . Bronchitis   . Eczema   . Obesity   . Seasonal allergies     Patient Active Problem List   Diagnosis Date Noted  . IUD check up 03/05/2019  . Morbid obesity with BMI of 50.0-59.9, adult (HCC) 06/11/2018  . 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 PROBLEMS WITH HEARING 11/18/2008  . ECZEMA 04/16/2007    Past Surgical History:  Procedure Laterality Date  . WISDOM TOOTH EXTRACTION       OB History    Gravida  1   Para  1   Term  1   Preterm      AB      Living  2     SAB      IAB      Ectopic      Multiple  1   Live Births  2           Family History  Problem Relation Age of Onset  . Arthritis Mother   . Hepatitis Mother   . Thyroid disease Mother   . Diabetes Father   . Heart disease Father   . Hypertension Father   . Breast cancer Maternal Aunt   . Breast cancer Maternal Grandmother     Social History   Tobacco Use  . Smoking status: Former Smoker    Types: Cigarettes    Quit date: 03/19/2018    Years since quitting: 2.7  . Smokeless  tobacco: Never Used  Substance Use Topics  . Alcohol use: Never  . Drug use: Never    Home Medications Prior to Admission medications   Medication Sig Start Date End Date Taking? Authorizing Provider  sertraline (ZOLOFT) 25 MG tablet Take by mouth. Patient not taking: Reported on 06/23/2020 01/29/20   [provider]    Allergies    Patient has no known allergies.  Review of Systems   Review of Systems  All other systems reviewed and are negative.   Physical Exam Updated Vital Signs Pulse 90   Temp 98.5 F (36.9 C) (Oral)   Resp 17   Ht 5\' 3"  (1.6 m)   Wt (!) 148.8 kg   LMP 12/01/2020   SpO2 100%   BMI 58.10 kg/m   Physical Exam Vitals and nursing note reviewed.  Constitutional:      General: She is not in acute distress.    Appearance: Normal appearance. She is not ill-appearing, toxic-appearing or diaphoretic.  HENT:  Head: Normocephalic and atraumatic.  Pulmonary:     Effort: Pulmonary effort is normal.  Musculoskeletal:     Comments: The right elbow appears grossly normal.  There is no swelling, redness, or warmth.  Ulnar and radial pulses are easily palpable and motor and sensation are intact throughout the entire hand.  She does have some discomfort with range of motion of the elbow, however no crepitus.  Skin:    General: Skin is warm and dry.  Neurological:     Mental Status: She is alert.     ED Results / Procedures / Treatments   Labs (all labs ordered are listed, but only abnormal results are displayed) Labs Reviewed - No data to display  EKG None  Radiology No results found.  Procedures Procedures   Medications Ordered in ED Medications - No data to display  ED Course  I have reviewed the triage vital signs and the nursing notes.  Pertinent labs & imaging results that were available during my care of the patient were reviewed by me and considered in my medical decision making (see chart for details).    MDM  Rules/Calculators/A&P  X-rays are negative.  Patient's pain likely related to overuse.  Patient will be placed in an arm sling, treated with naproxen, rest, and follow-up as needed if not improving.  Final Clinical Impression(s) / ED Diagnoses Final diagnoses:  None    Rx / DC Orders ED Discharge Orders    None       Geoffery Lyons, MD 12/21/20 [phone]

## 2020-12-21 NOTE — ED Triage Notes (Signed)
R arm pain x ~2 weeks with no specific known injury. Pinpoints pain to area just distal to elbow joint on medial aspect of upper forearm. Describes the pain as shooting up her arm at times. She states the pain is intermittent, and states its "made worse by everyday stuff" including picking up her 29 lb child. She works as an Engineer, production to a wheelchair bound individual.

## 2020-12-21 NOTE — Discharge Instructions (Addendum)
Begin taking naproxen as prescribed.  Wear arm sling as applied for the next several days for rest and comfort.  If symptoms are not improving in the next week, follow-up with your primary doctor for reevaluation.

## 2021-02-27 ENCOUNTER — Emergency Department (HOSPITAL_BASED_OUTPATIENT_CLINIC_OR_DEPARTMENT_OTHER)
Admission: EM | Admit: 2021-02-27 | Discharge: 2021-02-27 | Disposition: A | Discharge status: Home / Self Care | Payer: Medicaid Other | Attending: Emergency Medicine | Admitting: Emergency Medicine

## 2021-02-27 ENCOUNTER — Other Ambulatory Visit: Payer: Self-pay

## 2021-02-27 ENCOUNTER — Encounter (HOSPITAL_BASED_OUTPATIENT_CLINIC_OR_DEPARTMENT_OTHER): Payer: Self-pay | Admitting: Emergency Medicine

## 2021-02-27 DIAGNOSIS — J45909 Unspecified asthma, uncomplicated: Secondary | ICD-10-CM | POA: Diagnosis not present

## 2021-02-27 DIAGNOSIS — N644 Mastodynia: Secondary | ICD-10-CM | POA: Insufficient documentation

## 2021-02-27 DIAGNOSIS — R519 Headache, unspecified: Secondary | ICD-10-CM | POA: Diagnosis not present

## 2021-02-27 DIAGNOSIS — R110 Nausea: Secondary | ICD-10-CM | POA: Insufficient documentation

## 2021-02-27 DIAGNOSIS — R1030 Lower abdominal pain, unspecified: Secondary | ICD-10-CM | POA: Diagnosis not present

## 2021-02-27 DIAGNOSIS — Z87891 Personal history of nicotine dependence: Secondary | ICD-10-CM | POA: Diagnosis not present

## 2021-02-27 LAB — PREGNANCY, URINE: Preg Test, Ur: NEGATIVE

## 2021-02-27 MED ORDER — PROCHLORPERAZINE EDISYLATE 10 MG/2ML IJ SOLN
10.0000 mg | Freq: Once | INTRAMUSCULAR | Status: AC
  Administered 2021-02-27: 10 mg via INTRAMUSCULAR
  Filled 2021-02-27: qty 2

## 2021-02-27 MED ORDER — KETOROLAC TROMETHAMINE 60 MG/2ML IM SOLN
30.0000 mg | Freq: Once | INTRAMUSCULAR | Status: AC
  Administered 2021-02-27: 30 mg via INTRAMUSCULAR
  Filled 2021-02-27: qty 2

## 2021-02-27 NOTE — ED Triage Notes (Signed)
Pt c/o headache, breast tenderness, vomiting x 1 episode, and spotting.

## 2021-02-27 NOTE — ED Provider Notes (Signed)
[facility] EMERGENCY DEPARTMENT Provider Note  CSN: [id number] Arrival date & time: 02/27/21 0160  Chief Complaint(s) No chief complaint on file.  HPI Autumn Duarte is a 25 y.o. female here with multiple complaints including lower abdominal cramping, nipple tenderness, nausea, headache.  Most concerning is the nipple tenderness and a headache. Headaches been ongoing for 3 days.  Frontal.  Aching.  Consistent with prior migraine headaches.  Mildly alleviated with Motrin.  No fevers or chills.  No coughing or congestion.  No nuchal rigidity.  No focal deficits or visual disturbance.  Nipple tenderness been ongoing for 1 to 2 weeks.  No redness or swelling.  No discharge.  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 Authorizing Provider  naproxen (NAPROSYN) 500 MG tablet Take 1 tablet (500 mg total) by mouth 2 (two) times daily with a meal. 12/21/20   Geoffery Lyons, MD  sertraline (ZOLOFT) 25 MG tablet Take by mouth. Patient not taking: Reported on 06/23/2020 01/29/20   [provider]                                                                                                                                    Past Surgical History Past Surgical History:  Procedure Laterality Date   WISDOM TOOTH EXTRACTION     Family History Family History  Problem Relation Age of Onset   Arthritis Mother    Hepatitis Mother    Thyroid disease Mother    Diabetes Father    Heart disease Father    Hypertension  Father    Breast cancer Maternal Aunt    Breast cancer Maternal Grandmother     Social History Social History   Tobacco Use   Smoking status: Former    Types: Cigarettes    Quit date: 03/19/2018    Years since quitting: 2.9   Smokeless tobacco: Never  Substance Use Topics   Alcohol use: Never   Drug use: Never   Allergies Patient has no known allergies.  Review of Systems Review of Systems All other systems are reviewed and are negative for acute change except as noted in the HPI  Physical Exam Vital Signs  I have reviewed the triage vital signs BP (!) 137/101   Pulse 96   Temp 98.6 F (37 C) (Oral)   Resp 16   Ht 5\' 4"  (1.626 m)  Wt (!) 144.8 kg   LMP 02/01/2021 (Exact Date)   SpO2 99%   BMI 54.80 kg/m   Physical Exam Vitals reviewed.  Constitutional:      General: She is not in acute distress.    Appearance: She is well-developed. She is not diaphoretic.  HENT:     Head: Normocephalic and atraumatic.     Right Ear: External ear normal.     Left Ear: External ear normal.     Nose: Nose normal.  Eyes:     General: No scleral icterus.    Conjunctiva/sclera: Conjunctivae normal.  Neck:     Trachea: Phonation normal.  Cardiovascular:     Rate and Rhythm: Normal rate and regular rhythm.  Pulmonary:     Effort: Pulmonary effort is normal. No respiratory distress.     Breath sounds: No stridor.  Chest:  Breasts:    Right: Tenderness present. No swelling, bleeding, inverted nipple, mass, nipple discharge or skin change.     Left: Tenderness present. No swelling, bleeding, inverted nipple, mass, nipple discharge or skin change.  Abdominal:     General: There is no distension.     Tenderness: There is no abdominal tenderness.  Musculoskeletal:        General: Normal range of motion.     Cervical back: Normal range of motion.  Neurological:     Mental Status: She is alert and oriented to person, place, and time.     Cranial Nerves: Cranial nerves are  intact.     Sensory: Sensation is intact.     Motor: Motor function is intact.  Psychiatric:        Behavior: Behavior normal.    ED Results and Treatments Labs (all labs ordered are listed, but only abnormal results are displayed) Labs Reviewed  PREGNANCY, URINE                                                                                                                         EKG  EKG Interpretation  Date/Time:    Ventricular Rate:    PR Interval:    QRS Duration:   QT Interval:    QTC Calculation:   R Axis:     Text Interpretation:         Radiology No results found.  Pertinent labs & imaging results that were available during my care of the patient were reviewed by me and considered in my medical decision making (see chart for details).  Medications Ordered in ED Medications  prochlorperazine (COMPAZINE) injection 10 mg (has no administration in time range)  ketorolac (TORADOL) injection 30 mg (has no administration in time range)  Procedures Procedures  (including critical care time)  Medical Decision Making / ED Course I have reviewed the nursing notes for this encounter and the patient's prior records (if available in EHR or on provided paperwork).   Autumn Duarte was evaluated in Emergency Department on 02/27/2021 for the symptoms described in the history of present illness. She was evaluated in the context of the global COVID-19 pandemic, which necessitated consideration that the patient might be at risk for infection with the SARS-CoV-2 virus that causes COVID-19. Institutional protocols and algorithms that pertain to the evaluation of patients at risk for COVID-19 are in a state of rapid change based on information released by regulatory bodies including the CDC and federal and state organizations. These policies and algorithms  were followed during the patient's care in the ED.  Typical migraine headache for the pt. Non focal neuro exam. No recent head trauma. No fever. Doubt meningitis. Doubt intracranial bleed. Doubt IIH. No indication for imaging.   No evidence of inflammation or infection of the breasts. Abdomen benign. UPT negative.      Final Clinical Impression(s) / ED Diagnoses Final diagnoses:  Bad headache  Nipple pain  The patient appears reasonably screened and/or stabilized for discharge and I doubt any other medical condition or other [facility] requiring further screening, evaluation, or treatment in the ED at this time prior to discharge. Safe for discharge with strict return precautions.  Disposition: Discharge  Condition: Good  I have discussed the results, Dx and Tx plan with the patient/family who expressed understanding and agree(s) with the plan. Discharge instructions discussed at length. The patient/family was given strict return precautions who verbalized understanding of the instructions. No further questions at time of discharge.    ED Discharge Orders     None       Follow Up: Bailey Mech, PA-C [address] [place] [address] [phone]  Go on 03/01/2021 as scheduled       This chart was dictated using voice recognition software.  Despite best efforts to proofread,  errors can occur which can change the documentation meaning.    Nira Conn, MD 02/27/21 [phone]

## 2021-04-15 ENCOUNTER — Other Ambulatory Visit: Payer: Self-pay

## 2021-04-15 ENCOUNTER — Ambulatory Visit (INDEPENDENT_AMBULATORY_CARE_PROVIDER_SITE_OTHER): Payer: Medicaid Other | Admitting: Nurse Practitioner

## 2021-04-15 ENCOUNTER — Encounter: Payer: Self-pay | Admitting: Nurse Practitioner

## 2021-04-15 ENCOUNTER — Other Ambulatory Visit (HOSPITAL_COMMUNITY)
Admission: RE | Admit: 2021-04-15 | Discharge: 2021-04-15 | Disposition: A | Discharge status: Home / Self Care | Payer: Medicaid Other | Source: Ambulatory Visit | Attending: Medical | Admitting: Medical

## 2021-04-15 VITALS — BP 125/78 | HR 100 | Wt 320.0 lb

## 2021-04-15 DIAGNOSIS — Z30011 Encounter for initial prescription of contraceptive pills: Secondary | ICD-10-CM

## 2021-04-15 DIAGNOSIS — Z30432 Encounter for removal of intrauterine contraceptive device: Secondary | ICD-10-CM | POA: Diagnosis not present

## 2021-04-15 DIAGNOSIS — N898 Other specified noninflammatory disorders of vagina: Secondary | ICD-10-CM | POA: Diagnosis present

## 2021-04-15 DIAGNOSIS — Z23 Encounter for immunization: Secondary | ICD-10-CM

## 2021-04-15 MED ORDER — LEVONORGESTREL-ETHINYL ESTRAD 0.15-30 MG-MCG PO TABS: 1.0000 | ORAL_TABLET | Freq: Every day | ORAL | 2 refills | Status: DC

## 2021-04-15 NOTE — Progress Notes (Signed)
   GYNECOLOGY OFFICE VISIT NOTE   History:  25 y.o. G1P1002 here today for IUD removal.  She has been unhappy with the lack of a regular period with the [place] IUD.  She wants a break from the IUD.  Has 23 year old twins at home.  Advised her periods will go back to the way they were previously going from 3 days currently to her 5 day usual.  Advised she can become fertile again immediately and could become pregnant soon.  She decided to try birth control pills but was strong in her decision to have the IUD removed. She denies any abnormal vaginal discharge, bleeding, pelvic pain or other concerns.   Past Medical History:  Diagnosis Date   Asthma    last used inhaler 2 months ago   Bronchitis    Eczema    Obesity    Seasonal allergies     Past Surgical History:  Procedure Laterality Date   WISDOM TOOTH EXTRACTION      The following portions of the patient's history were reviewed and updated as appropriate: allergies, current medications, past family history, past medical history, past social history, past surgical history and problem list.   Health Maintenance:  Normal pap on 01-29-19.     Review of Systems:  Pertinent items noted in HPI and remainder of comprehensive ROS otherwise negative.  Objective:  Physical Exam BP 125/78   Pulse 100   Wt (!) 320 lb (145.2 kg)   BMI 54.93 kg/m  CONSTITUTIONAL: Well-developed, well-nourished female in no acute distress.  HENT:  Normocephalic, atraumatic. External right and left ear normal.  EYES: Conjunctivae and EOM are normal. Pupils are equal, round.  No scleral icterus.  NECK: Normal range of motion, supple, no masses SKIN: Skin is warm and dry. No rash noted. Not diaphoretic. No erythema. No pallor. NEUROLOGIC: Alert and oriented to person, place, and time. Normal muscle tone coordination. No cranial nerve deficit noted. PSYCHIATRIC: Normal mood and affect. Normal behavior. Normal judgment and thought content. CARDIOVASCULAR: Normal  heart rate noted RESPIRATORY: Effort and breath sounds normal, no problems with respiration noted ABDOMEN: Soft, no distention noted.   PELVIC:  IUD removal consent signed and time out done.  speculum exam normal and IUD strings visualized and IUD removed without discomfort.  Vaginal swab done for STD testing MUSCULOSKELETAL: Normal range of motion. No edema noted.  Labs and Imaging No results found.  Assessment & Plan:  1. Encounter for IUD removal Removed easily  2. Encounter for BCP (birth control pills) initial prescription Will start pills on Sunday.  Take daily.  Use condoms for 4 weeks if having intercourse.  3. Vaginal discharge Results pending.  - Cervicovaginal ancillary only( [place])  4. Need for immunization against influenza Flu shot given. - Flu Vaccine QUAD 43mo+IM (Fluarix, Fluzone & Alfiuria Quad PF)   Routine preventative health maintenance measures emphasized. Please refer to After Visit Summary for other counseling recommendations.   Return in about 2 months (around 06/15/2021) for RN visit for BP check and pill refill.   Total face-to-face time with patient:  10  minutes.  Over 50% of encounter was spent on counseling and coordination of care.  Nolene Bernheim, RN, MSN, NP-BC Nurse Practitioner, [facility] for [employer], [facility] 04/15/2021 6:36 PM

## 2021-04-16 LAB — CERVICOVAGINAL ANCILLARY ONLY
Bacterial Vaginitis (gardnerella): POSITIVE — AB
Candida Glabrata: NEGATIVE
Candida Vaginitis: NEGATIVE
Chlamydia: NEGATIVE
Comment: NEGATIVE
Comment: NEGATIVE
Comment: NEGATIVE
Comment: NEGATIVE
Comment: NEGATIVE
Comment: NORMAL
Neisseria Gonorrhea: NEGATIVE
Trichomonas: POSITIVE — AB

## 2021-04-16 MED ORDER — METRONIDAZOLE 500 MG PO TABS: 500.0000 mg | ORAL_TABLET | Freq: Two times a day (BID) | ORAL | 0 refills | Status: DC

## 2021-04-16 NOTE — Addendum Note (Signed)
Addended by: Currie Paris on: 04/16/2021 02:39 PM   Modules accepted: Orders

## 2021-05-01 ENCOUNTER — Encounter: Payer: Self-pay | Admitting: Radiology

## 2021-07-06 ENCOUNTER — Encounter (HOSPITAL_BASED_OUTPATIENT_CLINIC_OR_DEPARTMENT_OTHER): Payer: Self-pay

## 2021-07-06 ENCOUNTER — Emergency Department (HOSPITAL_BASED_OUTPATIENT_CLINIC_OR_DEPARTMENT_OTHER)
Admission: EM | Admit: 2021-07-06 | Discharge: 2021-07-06 | Disposition: A | Discharge status: Home / Self Care | Payer: Medicaid Other | Attending: Emergency Medicine | Admitting: Emergency Medicine

## 2021-07-06 ENCOUNTER — Other Ambulatory Visit: Payer: Self-pay

## 2021-07-06 DIAGNOSIS — Z7951 Long term (current) use of inhaled steroids: Secondary | ICD-10-CM | POA: Diagnosis not present

## 2021-07-06 DIAGNOSIS — J45909 Unspecified asthma, uncomplicated: Secondary | ICD-10-CM | POA: Diagnosis not present

## 2021-07-06 DIAGNOSIS — J069 Acute upper respiratory infection, unspecified: Secondary | ICD-10-CM | POA: Diagnosis not present

## 2021-07-06 DIAGNOSIS — R519 Headache, unspecified: Secondary | ICD-10-CM | POA: Diagnosis present

## 2021-07-06 DIAGNOSIS — Z87891 Personal history of nicotine dependence: Secondary | ICD-10-CM | POA: Diagnosis not present

## 2021-07-06 DIAGNOSIS — Z20822 Contact with and (suspected) exposure to covid-19: Secondary | ICD-10-CM | POA: Diagnosis not present

## 2021-07-06 DIAGNOSIS — J3489 Other specified disorders of nose and nasal sinuses: Secondary | ICD-10-CM | POA: Insufficient documentation

## 2021-07-06 LAB — URINALYSIS, ROUTINE W REFLEX MICROSCOPIC
Bilirubin Urine: NEGATIVE
Glucose, UA: NEGATIVE mg/dL
Ketones, ur: NEGATIVE mg/dL
Leukocytes,Ua: NEGATIVE
Nitrite: NEGATIVE
Protein, ur: NEGATIVE mg/dL
Specific Gravity, Urine: 1.02 (ref 1.005–1.030)
pH: 8.5 — ABNORMAL HIGH (ref 5.0–8.0)

## 2021-07-06 LAB — URINALYSIS, MICROSCOPIC (REFLEX): WBC, UA: NONE SEEN WBC/hpf (ref 0–5)

## 2021-07-06 LAB — RESP PANEL BY RT-PCR (FLU A&B, COVID) ARPGX2
Influenza A by PCR: NEGATIVE
Influenza B by PCR: NEGATIVE
SARS Coronavirus 2 by RT PCR: NEGATIVE

## 2021-07-06 LAB — PREGNANCY, URINE: Preg Test, Ur: NEGATIVE

## 2021-07-06 NOTE — ED Triage Notes (Signed)
Pt c/o flu like sx x 4 days-NAD-steady gait 

## 2021-07-06 NOTE — Discharge Instructions (Signed)
You were seen in the ER today for your symptoms. You tested negative for COVID-19 and influenza.  You likely have another viral URI. You may take over the counter medications as needed. Return to the ER if you develop any new severe symptoms.

## 2021-07-06 NOTE — ED Provider Notes (Signed)
[facility] EMERGENCY DEPARTMENT Provider Note   CSN: [id number] Arrival date & time: 07/06/21  1330     History Chief Complaint  Patient presents with   Cough    Autumn Duarte is a 25 y.o. female who presents with concern for runny nose, decreased appetite, chills, fatigue, urinary frequency and urgency as well as headaches without blurry or double vision for the last 4 days.  She was unvaccinated for influenza this year.  Denies nausea, vomiting, or diarrhea.  Denies any chest pain or shortness of breath.  I personally reviewed this patient's medical record.  She is history of depression and prediabetes.  She works as a Naval architect.  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   PROBLEMS WITH HEARING 11/18/2008   ECZEMA 04/16/2007    Past Surgical History:  Procedure Laterality Date   WISDOM TOOTH EXTRACTION       OB History     Gravida  1   Para  1   Term  1   Preterm      AB      Living  2      SAB      IAB      Ectopic      Multiple  1   Live Births  2           Family History  Problem Relation Age of Onset   Arthritis Mother    Hepatitis Mother    Thyroid disease Mother    Diabetes Father    Heart disease Father    Hypertension Father    Breast cancer Maternal Aunt    Breast cancer Maternal Grandmother     Social History   Tobacco Use   Smoking status: Former    Types: Cigarettes    Quit date: 03/19/2018    Years since quitting: 3.3   Smokeless tobacco: Never  Vaping Use   Vaping Use: Never used  Substance Use Topics    Alcohol use: Never   Drug use: Never    Home Medications Prior to Admission medications   Medication Sig Start Date End Date Taking? Authorizing Provider  Cholecalciferol 1.25 MG (50000 UT) capsule Take by mouth. 11/18/19   [provider]  levonorgestrel-ethinyl estradiol (NORDETTE) 0.15-30 MG-MCG tablet Take 1 tablet by mouth daily. 04/15/21   Burleson, Brand Males, NP  metroNIDAZOLE (FLAGYL) 500 MG tablet Take 1 tablet (500 mg total) by mouth 2 (two) times daily. No alcohol while taking this medication 04/16/21   Currie Paris, NP  naproxen (NAPROSYN) 500 MG tablet Take 1 tablet (500 mg total) by mouth 2 (two) times daily with a meal. Patient not taking: Reported on 04/15/2021 12/21/20   Geoffery Lyons, MD  PROAIR HFA 108 [phone] Base) MCG/ACT inhaler Inhale into the lungs. 04/01/21   [provider]  rizatriptan (MAXALT-MLT) 10 MG disintegrating tablet Take by mouth. 03/05/21   [provider]  sertraline (ZOLOFT) 25 MG tablet Take by mouth. 01/29/20  [provider]  sertraline (ZOLOFT) 50 MG tablet Take 1 tablet by mouth daily. 04/30/20   [provider]    Allergies    Shellfish allergy  Review of Systems   Review of Systems  Constitutional:  Positive for appetite change, chills and fatigue. Negative for activity change and fever.  HENT:  Positive for congestion and rhinorrhea. Negative for sore throat.   Eyes: Negative.   Respiratory:  Positive for cough. Negative for shortness of breath.   Cardiovascular: Negative.   Gastrointestinal: Negative.   Genitourinary:  Positive for frequency and urgency. Negative for decreased urine volume, dysuria, vaginal bleeding, vaginal discharge and vaginal pain.  Musculoskeletal: Negative.   Neurological:  Positive for headaches. Negative for light-headedness.   Physical Exam Updated Vital Signs BP 108/73   Pulse 85   Temp 98.6 F (37 C)   Resp 18   Ht 5\' 4"  (1.626 m)   Wt (!) 146.5 kg   SpO2 99%   BMI  55.44 kg/m   Physical Exam Vitals and nursing note reviewed.  Constitutional:      Appearance: She is not ill-appearing or toxic-appearing.  HENT:     Head: Normocephalic and atraumatic.     Nose: Rhinorrhea present. No congestion. Rhinorrhea is clear.     Mouth/Throat:     Mouth: Mucous membranes are moist.     Pharynx: Oropharynx is clear. Uvula midline. No oropharyngeal exudate or posterior oropharyngeal erythema.     Tonsils: No tonsillar exudate.  Eyes:     General: Lids are normal. Vision grossly intact.        Right eye: No discharge.        Left eye: No discharge.     Extraocular Movements: Extraocular movements intact.     Conjunctiva/sclera: Conjunctivae normal.     Pupils: Pupils are equal, round, and reactive to light.  Neck:     Trachea: Trachea normal.  Cardiovascular:     Rate and Rhythm: Normal rate and regular rhythm.     Pulses: Normal pulses.     Heart sounds: Normal heart sounds. No murmur heard. Pulmonary:     Effort: Pulmonary effort is normal. No tachypnea, bradypnea, accessory muscle usage or respiratory distress.     Breath sounds: Normal breath sounds. No wheezing or rales.  Chest:     Chest wall: No mass, lacerations, deformity, swelling, tenderness, crepitus or edema.  Abdominal:     General: Bowel sounds are normal. There is no distension.     Palpations: Abdomen is soft.     Tenderness: There is no abdominal tenderness. There is no right CVA tenderness, left CVA tenderness, guarding or rebound.  Musculoskeletal:        General: No deformity.     Cervical back: Normal range of motion and neck supple. No edema, rigidity, tenderness or crepitus. No pain with movement, spinous process tenderness or muscular tenderness.     Right lower leg: No edema.     Left lower leg: No edema.  Lymphadenopathy:     Cervical: Cervical adenopathy present.     Right cervical: Superficial cervical adenopathy present.     Left cervical: Superficial cervical  adenopathy present.  Skin:    General: Skin is warm and dry.     Capillary Refill: Capillary refill takes less than 2 seconds.     Findings: No rash.  Neurological:     General: No focal deficit present.     Mental Status: She is alert and oriented  to person, place, and time. Mental status is at baseline.     Gait: Gait is intact. Gait normal.  Psychiatric:        Mood and Affect: Mood normal.    ED Results / Procedures / Treatments   Labs (all labs ordered are listed, but only abnormal results are displayed) Labs Reviewed  RESP PANEL BY RT-PCR (FLU A&B, COVID) ARPGX2 - Abnormal; Notable for the following components:      Result Value   SARS Coronavirus 2 by RT PCR   (*)    Value: INVALID, UNABLE TO DETERMINE THE PRESENCE OF TARGET DUE TO SPECIMEN INTEGRITY. RECOLLECTION REQUESTED.   Influenza A by PCR   (*)    Value: INVALID, UNABLE TO DETERMINE THE PRESENCE OF TARGET DUE TO SPECIMEN INTEGRITY. RECOLLECTION REQUESTED.   Influenza B by PCR   (*)    Value: INVALID, UNABLE TO DETERMINE THE PRESENCE OF TARGET DUE TO SPECIMEN INTEGRITY. RECOLLECTION REQUESTED.   All other components within normal limits  URINALYSIS, ROUTINE W REFLEX MICROSCOPIC - Abnormal; Notable for the following components:   pH 8.5 (*)    Hgb urine dipstick SMALL (*)    All other components within normal limits  URINALYSIS, MICROSCOPIC (REFLEX) - Abnormal; Notable for the following components:   Bacteria, UA RARE (*)    All other components within normal limits  RESP PANEL BY RT-PCR (FLU A&B, COVID) ARPGX2  PREGNANCY, URINE    EKG None  Radiology No results found.  Procedures Procedures   Medications Ordered in ED Medications - No data to display  ED Course  I have reviewed the triage vital signs and the nursing notes.  Pertinent labs & imaging results that were available during my care of the patient were reviewed by me and considered in my medical decision making (see chart for details).     MDM Rules/Calculators/A&P                         25 year old female who presents with concern flu-like symptoms x 4 days.   Vital signs are normal and intake.  Cardiopulmonary exam is normal, abdominal exam is benign.  Patient with clear rhinorrhea but otherwise unremarkable exam.  Respiratory pathogen panel was pan negative.  UA obtained given patient's concern for urinary frequency and urgency which is unremarkable; she then states that she believes it secondary to her increased hydration over the last few days as she has been feeling poorly.  No further work-up warranted near this time given reassuring physical exam, vital signs, and laboratory studies.  Patient may follow-up with her primary care doctor. Autumn voiced understanding of her medical evaluation and treatment plan.  Her questions answered to her expressed satisfaction.  Return precautions were given.  Patient is stable and appropriate for discharge at this time.  This chart was dictated using voice recognition software, Dragon. Despite the best efforts of this provider to proofread and correct errors, errors may still occur which can change documentation meaning.  Final Clinical Impression(s) / ED Diagnoses Final diagnoses:  Upper respiratory tract infection, unspecified type    Rx / DC Orders ED Discharge Orders     None        Paris Lore, PA-C 07/06/21 1743    Horton, Clabe Seal, DO 07/06/21 2307

## 2021-07-29 ENCOUNTER — Encounter (HOSPITAL_BASED_OUTPATIENT_CLINIC_OR_DEPARTMENT_OTHER): Payer: Self-pay

## 2021-07-29 ENCOUNTER — Emergency Department (HOSPITAL_BASED_OUTPATIENT_CLINIC_OR_DEPARTMENT_OTHER)
Admission: EM | Admit: 2021-07-29 | Discharge: 2021-07-29 | Disposition: A | Discharge status: Home / Self Care | Payer: Medicaid Other | Attending: Emergency Medicine | Admitting: Emergency Medicine

## 2021-07-29 ENCOUNTER — Other Ambulatory Visit: Payer: Self-pay

## 2021-07-29 DIAGNOSIS — Z79899 Other long term (current) drug therapy: Secondary | ICD-10-CM | POA: Insufficient documentation

## 2021-07-29 DIAGNOSIS — J45909 Unspecified asthma, uncomplicated: Secondary | ICD-10-CM | POA: Diagnosis not present

## 2021-07-29 DIAGNOSIS — Z87891 Personal history of nicotine dependence: Secondary | ICD-10-CM | POA: Insufficient documentation

## 2021-07-29 DIAGNOSIS — R059 Cough, unspecified: Secondary | ICD-10-CM | POA: Diagnosis present

## 2021-07-29 DIAGNOSIS — U071 COVID-19: Secondary | ICD-10-CM | POA: Insufficient documentation

## 2021-07-29 LAB — RESP PANEL BY RT-PCR (FLU A&B, COVID) ARPGX2
Influenza A by PCR: NEGATIVE
Influenza B by PCR: NEGATIVE
SARS Coronavirus 2 by RT PCR: POSITIVE — AB

## 2021-07-29 MED ORDER — IBUPROFEN 800 MG PO TABS
800.0000 mg | ORAL_TABLET | Freq: Once | ORAL | Status: AC
  Administered 2021-07-29: 20:00:00 800 mg via ORAL
  Filled 2021-07-29: qty 1

## 2021-07-29 NOTE — ED Triage Notes (Signed)
Pt c/o flu like sx x 6 days-states she was seen here for same ~2weeks ago

## 2021-07-29 NOTE — ED Provider Notes (Signed)
[facility] EMERGENCY DEPARTMENT Provider Note   CSN: [id number] Arrival date & time: 07/29/21  1920     History Chief Complaint  Patient presents with   Cough    Autumn Duarte is a 25 y.o. female.  Patient presents with chief complaint of cough congestion body aches ongoing for 6 days.  She was seen here 2 weeks ago for similar symptoms then with negative work-up.  She states she had improved but got sick again for the past 6 days and presents again here today.  Denies vomiting or diarrhea denies chest pain or shortness of breath.      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   PROBLEMS WITH HEARING 11/18/2008   ECZEMA 04/16/2007    Past Surgical History:  Procedure Laterality Date   WISDOM TOOTH EXTRACTION       OB History     Gravida  1   Para  1   Term  1   Preterm      AB      Living  2      SAB      IAB      Ectopic      Multiple  1   Live Births  2           Family History  Problem Relation Age of Onset   Arthritis Mother    Hepatitis Mother    Thyroid disease Mother    Diabetes Father    Heart disease Father    Hypertension Father    Breast cancer Maternal Aunt    Breast cancer Maternal Grandmother     Social History   Tobacco Use   Smoking status: Former    Types: Cigarettes    Quit date: 03/19/2018    Years since quitting: 3.3   Smokeless tobacco: Never  Vaping Use   Vaping Use: Never used  Substance Use Topics   Alcohol use: Never   Drug use: Never    Home Medications Prior to Admission medications   Medication Sig  Start Date End Date Taking? Authorizing Provider  Cholecalciferol 1.25 MG (50000 UT) capsule Take by mouth. 11/18/19   [provider]  levonorgestrel-ethinyl estradiol (NORDETTE) 0.15-30 MG-MCG tablet Take 1 tablet by mouth daily. 04/15/21   Burleson, Brand Males, NP  metroNIDAZOLE (FLAGYL) 500 MG tablet Take 1 tablet (500 mg total) by mouth 2 (two) times daily. No alcohol while taking this medication 04/16/21   Currie Paris, NP  naproxen (NAPROSYN) 500 MG tablet Take 1 tablet (500 mg total) by mouth 2 (two) times daily with a meal. Patient not taking: Reported on 04/15/2021 12/21/20   Geoffery Lyons, MD  PROAIR HFA 108 [phone] Base) MCG/ACT inhaler Inhale into the lungs. 04/01/21   [provider]  rizatriptan (MAXALT-MLT) 10 MG disintegrating tablet Take by mouth. 03/05/21   [provider]  sertraline (ZOLOFT) 25 MG tablet Take by mouth. 01/29/20   [provider]  sertraline (ZOLOFT) 50 MG tablet Take 1 tablet by mouth  daily. 04/30/20   [provider]    Allergies    Shellfish allergy  Review of Systems   Review of Systems  Constitutional:  Positive for fever.  HENT:  Negative for ear pain.   Eyes:  Negative for pain.  Respiratory:  Positive for cough.   Cardiovascular:  Negative for chest pain.  Gastrointestinal:  Negative for abdominal pain.  Genitourinary:  Negative for flank pain.  Musculoskeletal:  Negative for back pain.  Skin:  Negative for rash.  Neurological:  Negative for headaches.   Physical Exam Updated Vital Signs BP 135/88 (BP Location: Left Arm)    Pulse (!) 123    Temp (!) 102.7 F (39.3 C) (Oral)    Resp 18    SpO2 99%   Physical Exam Constitutional:      General: She is not in acute distress.    Appearance: Normal appearance.  HENT:     Head: Normocephalic.     Nose: Nose normal.  Eyes:     Extraocular Movements: Extraocular movements intact.  Cardiovascular:     Rate and Rhythm: Normal rate.  Pulmonary:     Effort:  Pulmonary effort is normal.  Musculoskeletal:        General: Normal range of motion.     Cervical back: Normal range of motion.  Neurological:     General: No focal deficit present.     Mental Status: She is alert. Mental status is at baseline.    ED Results / Procedures / Treatments   Labs (all labs ordered are listed, but only abnormal results are displayed) Labs Reviewed  RESP PANEL BY RT-PCR (FLU A&B, COVID) ARPGX2 - Abnormal; Notable for the following components:      Result Value   SARS Coronavirus 2 by RT PCR POSITIVE (*)    All other components within normal limits    EKG None  Radiology No results found.  Procedures Procedures   Medications Ordered in ED Medications  ibuprofen (ADVIL) tablet 800 mg (800 mg Oral Given 07/29/21 1941)    ED Course  I have reviewed the triage vital signs and the nursing notes.  Pertinent labs & imaging results that were available during my care of the patient were reviewed by me and considered in my medical decision making (see chart for details).    MDM Rules/Calculators/A&P                         Viral panel was positive for COVID.  Patient is 7 days into her illness.  Recommending continued supportive care at home fluid hydration.  Advised her to call her primary care doctor for videoconference within 2 to 3 days.  Advised immediate return if chest difficulty breathing or any additional concerns.     Final Clinical Impression(s) / ED Diagnoses Final diagnoses:  COVID-19 virus infection    Rx / DC Orders ED Discharge Orders     None        Cheryll Cockayne, MD 07/29/21 2304

## 2021-07-29 NOTE — Discharge Instructions (Addendum)
Call your primary care doctor or specialist as discussed in the next 2-3 days.   Return immediately back to the ER if:  Your symptoms worsen within the next 12-24 hours. You develop new symptoms such as new fevers, persistent vomiting, new pain, shortness of breath, or new weakness or numbness, or if you have any other concerns.  

## 2021-08-01 NOTE — L&D Delivery Note (Addendum)
[facility] Delivery Note  Autumn Duarte is a 26 y.o. G2P1002 s/p SVD di-di twins  at [redacted]w[redacted]d. She was admitted for PPROM.   ROM: 271h 94m with clear fluid GBS Status:  Negative/-- (12/12 0000) Maximum Maternal Temperature: Temp (24hrs), Avg:98.3 F (36.8 C), Min:98.1 F (36.7 C), Max:98.4 F (36.9 C)    Labor Progress: Patient arrived at 5 cm dilation and progressed spontaneously  Delivery Date/Time: 07/18/2022 at 0813 Delivery:  Twin A:  Called to room and patient was complete and pushing. Head delivered in ROA position. Nuchal cord present. Shoulder and body delivered in usual fashion. Infant with spontaneous cry, placed on mother's abdomen, dried and stimulated. Cord clamped x 2 after 1-minute delay, and cut by provider. Cord blood drawn.  Delivery Date/Time: 07/18/2022 at 0849 Delivery:  Twin B:  Called to room and patient was complete and pushing. Head delivered in  position. Nuchal cord present. Shoulder and body delivered in usual fashion. Infant with spontaneous cry, placed on mother's abdomen, dried and stimulated. Cord clamped x 2 after 1-minute delay, and cut by provider. Cord blood drawn.  Both placentas delivered spontaneously with gentle cord traction. Fundus firm with massage and Pitocin. Labia, perineum, vagina, and cervix inspected with no lacerations .  Considerable blood loss appreciated.  Patient had received TXA prior to delivery and received a second dose.  Methergine was also given.  Code hemorrhage was called and Mel Almond was placed.  Patient was given 800 mcg of Cytotec rectally.  Concern for DIC.  Placenta: Spontaneous, intact, three-vessel: Complications: Postpartum hemorrhage Lacerations: Right periurethral that was hemostatic EBL: 1919 mL Analgesia: None   Infant: APGAR (1 MIN):    Pia, Jedlicka [[id number]]  3    Shayona, Hibbitts [[id number]]     APGAR (5 MINS):    Abbagayle, Zaragoza [[id number]]  9    Felisia, Balcom [[id number]]      APGAR (10 MINS):    Shagun, Wordell [[id number]]     Aryonna, Gunnerson [[id number]]    Derrel Nip, MD  OB Fellow  07/18/2022 9:23 AM

## 2021-09-01 ENCOUNTER — Emergency Department (HOSPITAL_BASED_OUTPATIENT_CLINIC_OR_DEPARTMENT_OTHER)
Admission: EM | Admit: 2021-09-01 | Discharge: 2021-09-01 | Disposition: A | Discharge status: Home / Self Care | Payer: Medicaid Other | Attending: Emergency Medicine | Admitting: Emergency Medicine

## 2021-09-01 ENCOUNTER — Other Ambulatory Visit: Payer: Self-pay

## 2021-09-01 ENCOUNTER — Encounter (HOSPITAL_BASED_OUTPATIENT_CLINIC_OR_DEPARTMENT_OTHER): Payer: Self-pay

## 2021-09-01 DIAGNOSIS — E860 Dehydration: Secondary | ICD-10-CM | POA: Diagnosis not present

## 2021-09-01 DIAGNOSIS — R1110 Vomiting, unspecified: Secondary | ICD-10-CM | POA: Diagnosis present

## 2021-09-01 DIAGNOSIS — Z20822 Contact with and (suspected) exposure to covid-19: Secondary | ICD-10-CM | POA: Diagnosis not present

## 2021-09-01 DIAGNOSIS — R112 Nausea with vomiting, unspecified: Secondary | ICD-10-CM

## 2021-09-01 LAB — COMPREHENSIVE METABOLIC PANEL
ALT: 20 U/L (ref 0–44)
AST: 20 U/L (ref 15–41)
Albumin: 3.5 g/dL (ref 3.5–5.0)
Alkaline Phosphatase: 26 U/L — ABNORMAL LOW (ref 38–126)
Anion gap: 9 (ref 5–15)
BUN: 13 mg/dL (ref 6–20)
CO2: 23 mmol/L (ref 22–32)
Calcium: 8.4 mg/dL — ABNORMAL LOW (ref 8.9–10.3)
Chloride: 103 mmol/L (ref 98–111)
Creatinine, Ser: 0.74 mg/dL (ref 0.44–1.00)
GFR, Estimated: >60 mL/min (ref >60)
Glucose, Bld: 96 mg/dL (ref 70–99)
Potassium: 3 mmol/L — ABNORMAL LOW (ref 3.5–5.1)
Sodium: 135 mmol/L (ref 135–145)
Total Bilirubin: 0.9 mg/dL (ref 0.3–1.2)
Total Protein: 7.2 g/dL (ref 6.5–8.1)

## 2021-09-01 LAB — CBC WITH DIFFERENTIAL/PLATELET
Abs Immature Granulocytes: 0.02 10*3/uL (ref 0.00–0.07)
Basophils Absolute: 0 10*3/uL (ref 0.0–0.1)
Basophils Relative: 0 %
Eosinophils Absolute: 0 10*3/uL (ref 0.0–0.5)
Eosinophils Relative: 1 %
HCT: 38.9 % (ref 36.0–46.0)
Hemoglobin: 13.9 g/dL (ref 12.0–15.0)
Immature Granulocytes: 0 %
Lymphocytes Relative: 23 %
Lymphs Abs: 1.8 10*3/uL (ref 0.7–4.0)
MCH: 31.2 pg (ref 26.0–34.0)
MCHC: 35.7 g/dL (ref 30.0–36.0)
MCV: 87.2 fL (ref 80.0–100.0)
Monocytes Absolute: 0.4 10*3/uL (ref 0.1–1.0)
Monocytes Relative: 5 %
Neutro Abs: 5.5 10*3/uL (ref 1.7–7.7)
Neutrophils Relative %: 71 %
Platelets: 250 10*3/uL (ref 150–400)
RBC: 4.46 MIL/uL (ref 3.87–5.11)
RDW: 12.6 % (ref 11.5–15.5)
WBC: 7.7 10*3/uL (ref 4.0–10.5)
nRBC: 0 % (ref 0.0–0.2)

## 2021-09-01 LAB — LIPASE, BLOOD: Lipase: 21 U/L (ref 11–51)

## 2021-09-01 LAB — RESP PANEL BY RT-PCR (FLU A&B, COVID) ARPGX2
Influenza A by PCR: NEGATIVE
Influenza B by PCR: NEGATIVE
SARS Coronavirus 2 by RT PCR: NEGATIVE

## 2021-09-01 LAB — PREGNANCY, URINE: Preg Test, Ur: NEGATIVE

## 2021-09-01 MED ORDER — LACTATED RINGERS IV BOLUS
1000.0000 mL | Freq: Once | INTRAVENOUS | Status: AC
  Administered 2021-09-01: 1000 mL via INTRAVENOUS

## 2021-09-01 MED ORDER — ONDANSETRON HCL 4 MG/2ML IJ SOLN
4.0000 mg | Freq: Once | INTRAMUSCULAR | Status: AC
  Administered 2021-09-01: 4 mg via INTRAVENOUS
  Filled 2021-09-01: qty 2

## 2021-09-01 MED ORDER — ONDANSETRON HCL 4 MG PO TABS: 4.0000 mg | ORAL_TABLET | Freq: Four times a day (QID) | ORAL | 0 refills | Status: DC

## 2021-09-01 NOTE — ED Triage Notes (Signed)
Yesterday was driving and had an episode of emesis. Continues to have emesis today, unable to tolerate PO.

## 2021-09-01 NOTE — Discharge Instructions (Signed)
All your labs look normal today.  Make sure you are drinking plenty of fluids and a prescription for nausea medication was sent to the pharmacy.  If you start having high fever, severe pain in your abdomen or the vomiting does not improve return to the emergency room.

## 2021-09-01 NOTE — ED Provider Notes (Signed)
[facility] Provider Note   CSN: [id number] Arrival date & time: 09/01/21  1951     History  Chief Complaint  Patient presents with   Emesis    Autumn Duarte is a 26 y.o. female.  The history is provided by the patient.  Emesis Severity:  Moderate Duration:  12 hours Timing:  Intermittent Quality:  Stomach contents Progression:  Improving Chronicity:  New Recent urination:  Decreased Context: not post-tussive   Relieved by:  Nothing Worsened by:  Food smell Ineffective treatments:  None tried Associated symptoms: chills and fever   Associated symptoms: no abdominal pain, no cough, no diarrhea and no headaches   Risk factors comment:  History of asthma, eczema and depression.  Last menstrual cycle was January 18 but she reported it was more spotting.     Home Medications Prior to Admission medications   Medication Sig Start Date End Date Taking? Authorizing Provider  Cholecalciferol 1.25 MG (50000 UT) capsule Take by mouth. 11/18/19   [provider]  levonorgestrel-ethinyl estradiol (NORDETTE) 0.15-30 MG-MCG tablet Take 1 tablet by mouth daily. 04/15/21   Burleson, Rona Ravens, NP  metroNIDAZOLE (FLAGYL) 500 MG tablet Take 1 tablet (500 mg total) by mouth 2 (two) times daily. No alcohol while taking this medication 04/16/21   Virginia Rochester, NP  naproxen (NAPROSYN) 500 MG tablet Take 1 tablet (500 mg total) by mouth 2 (two) times daily with a meal. Patient not taking: Reported on 04/15/2021 12/21/20   Veryl Speak, MD  PROAIR HFA 108 [phone] Base) MCG/ACT inhaler Inhale into the lungs. 04/01/21   [provider]  rizatriptan (MAXALT-MLT) 10 MG disintegrating tablet Take by mouth. 03/05/21   [provider]  sertraline (ZOLOFT) 25 MG tablet Take by mouth. 01/29/20   [provider]  sertraline (ZOLOFT) 50 MG tablet Take 1 tablet by mouth daily. 04/30/20   [provider]      Allergies    Shellfish allergy     Review of Systems   Review of Systems  Constitutional:  Positive for chills and fever.  Respiratory:  Negative for cough.   Gastrointestinal:  Positive for vomiting. Negative for abdominal pain and diarrhea.  Neurological:  Negative for headaches.   Physical Exam Updated Vital Signs BP (!) 145/87 (BP Location: Right Arm)    Pulse (!) 109    Temp 98.3 F (36.8 C) (Oral)    Resp 20    Ht 5\' 4"  (1.626 m)    Wt (!) 143.3 kg    LMP 08/13/2021 (Exact Date)    SpO2 98%    BMI 54.24 kg/m  Physical Exam Vitals and nursing note reviewed.  Constitutional:      General: She is not in acute distress.    Appearance: Normal appearance. She is well-developed.  HENT:     Head: Normocephalic and atraumatic.     Mouth/Throat:     Mouth: Mucous membranes are dry.  Eyes:     Pupils: Pupils are equal, round, and reactive to light.  Cardiovascular:     Rate and Rhythm: Regular rhythm. Tachycardia present.     Heart sounds: Normal heart sounds. No murmur heard.   No friction rub.  Pulmonary:     Effort: Pulmonary effort is normal.     Breath sounds: Normal breath sounds. No wheezing or rales.  Abdominal:     General: Bowel sounds are normal. There is no distension.     Palpations: Abdomen is soft.  Tenderness: There is no abdominal tenderness. There is no guarding or rebound.  Musculoskeletal:        General: No tenderness. Normal range of motion.     Right lower leg: No edema.     Left lower leg: No edema.     Comments: No edema  Skin:    General: Skin is warm and dry.     Findings: No rash.  Neurological:     Mental Status: She is alert and oriented to person, place, and time.     Cranial Nerves: No cranial nerve deficit.  Psychiatric:        Behavior: Behavior normal.    ED Results / Procedures / Treatments   Labs (all labs ordered are listed, but only abnormal results are displayed) Labs Reviewed  COMPREHENSIVE METABOLIC PANEL - Abnormal; Notable for the following components:       Result Value   Potassium 3.0 (*)    Calcium 8.4 (*)    Alkaline Phosphatase 26 (*)    All other components within normal limits  RESP PANEL BY RT-PCR (FLU A&B, COVID) ARPGX2  PREGNANCY, URINE  CBC WITH DIFFERENTIAL/PLATELET  LIPASE, BLOOD    EKG None  Radiology No results found.  Procedures Procedures    Medications Ordered in ED Medications  ondansetron (ZOFRAN) injection 4 mg (4 mg Intravenous Given 09/01/21 2108)  lactated ringers bolus 1,000 mL ( Intravenous Stopped 09/01/21 2205)    ED Course/ Medical Decision Making/ A&P                           Medical Decision Making Amount and/or Complexity of Data Reviewed Labs: ordered.  Risk Prescription drug management.   Pt with symptoms most consistent with a viral process with fever/vomitting.  Denies bad food exposure and recent travel out of the country and sister with similar symptoms.  No recent abx.  No hx concerning for GU pathology or kidney stones.  Pt is awake and alert on exam without peritoneal signs.  She was given IV fluids and Zofran.  She did have unusual last menstrual cycle when sure no evidence of pregnancy.  10:58 PM I independently evaluated patient's labs and interpreted them.  CBC within normal limits, CMP with mild hypokalemia of 3.0 and patient instructed to eat bananas, drink orange juice.  Urine pregnancy test was negative and lipase is within normal limits.  COVID is negative.  Patient is tolerating p.o.'s and has had no further vomiting.  She will be discharged home with antiemetics.        Final Clinical Impression(s) / ED Diagnoses Final diagnoses:  Dehydration  Nausea and vomiting, unspecified vomiting type    Rx / DC Orders ED Discharge Orders          Ordered    ondansetron (ZOFRAN) 4 MG tablet  Every 6 hours        09/01/21 2300              Blanchie Dessert, MD 09/01/21 2300

## 2021-09-14 ENCOUNTER — Encounter (HOSPITAL_BASED_OUTPATIENT_CLINIC_OR_DEPARTMENT_OTHER): Payer: Self-pay

## 2021-09-14 ENCOUNTER — Emergency Department (HOSPITAL_BASED_OUTPATIENT_CLINIC_OR_DEPARTMENT_OTHER)
Admission: EM | Admit: 2021-09-14 | Discharge: 2021-09-14 | Disposition: A | Discharge status: Home / Self Care | Payer: Medicaid Other | Attending: Emergency Medicine | Admitting: Emergency Medicine

## 2021-09-14 ENCOUNTER — Other Ambulatory Visit: Payer: Self-pay

## 2021-09-14 DIAGNOSIS — G43019 Migraine without aura, intractable, without status migrainosus: Secondary | ICD-10-CM

## 2021-09-14 DIAGNOSIS — G43919 Migraine, unspecified, intractable, without status migrainosus: Secondary | ICD-10-CM | POA: Diagnosis present

## 2021-09-14 MED ORDER — DIPHENHYDRAMINE HCL 50 MG/ML IJ SOLN
12.5000 mg | Freq: Once | INTRAMUSCULAR | Status: AC
  Administered 2021-09-14: 12.5 mg via INTRAVENOUS
  Filled 2021-09-14: qty 1

## 2021-09-14 MED ORDER — SODIUM CHLORIDE 0.9 % IV BOLUS
1000.0000 mL | Freq: Once | INTRAVENOUS | Status: AC
  Administered 2021-09-14: 1000 mL via INTRAVENOUS

## 2021-09-14 MED ORDER — PROCHLORPERAZINE EDISYLATE 10 MG/2ML IJ SOLN
10.0000 mg | Freq: Once | INTRAMUSCULAR | Status: AC
  Administered 2021-09-14: 10 mg via INTRAVENOUS
  Filled 2021-09-14: qty 2

## 2021-09-14 MED ORDER — KETOROLAC TROMETHAMINE 15 MG/ML IJ SOLN
15.0000 mg | Freq: Once | INTRAMUSCULAR | Status: AC
  Administered 2021-09-14: 15 mg via INTRAVENOUS
  Filled 2021-09-14: qty 1

## 2021-09-14 NOTE — ED Triage Notes (Signed)
Pt c/o migraine x 2 days-NAD-steady gait

## 2021-09-14 NOTE — Discharge Instructions (Addendum)
You were seen in the emergency department today for migraine.  As we discussed I reassured by the fact that your symptoms improved with the medicines given.  Continue to monitor how you are doing and return to the emergency department for any new or worsening symptoms.

## 2021-09-14 NOTE — ED Provider Notes (Signed)
[facility] EMERGENCY DEPARTMENT Provider Note   CSN: [id number] Arrival date & time: 09/14/21  2149     History  Chief Complaint  Patient presents with   Migraine    Autumn Duarte is a 26 y.o. female with history of migraines who presents to the emergency for migraine x2 days.  She has not tried anything at home for this.  She states that she had chills of the weekend, no recurrence of this.  No fever, nausea, vomiting.   Migraine Associated symptoms include headaches. Pertinent negatives include no abdominal pain.      Home Medications Prior to Admission medications   Medication Sig Start Date End Date Taking? Authorizing Provider  Cholecalciferol 1.25 MG (50000 UT) capsule Take by mouth. 11/18/19   [provider]  levonorgestrel-ethinyl estradiol (NORDETTE) 0.15-30 MG-MCG tablet Take 1 tablet by mouth daily. 04/15/21   Burleson, Brand Males, NP  metroNIDAZOLE (FLAGYL) 500 MG tablet Take 1 tablet (500 mg total) by mouth 2 (two) times daily. No alcohol while taking this medication 04/16/21   Currie Paris, NP  naproxen (NAPROSYN) 500 MG tablet Take 1 tablet (500 mg total) by mouth 2 (two) times daily with a meal. Patient not taking: Reported on 04/15/2021 12/21/20   Geoffery Lyons, MD  ondansetron (ZOFRAN) 4 MG tablet Take 1 tablet (4 mg total) by mouth every 6 (six) hours. 09/01/21   Gwyneth Sprout, MD  PROAIR HFA 108 [phone] Base) MCG/ACT inhaler Inhale into the lungs. 04/01/21   [provider]  rizatriptan (MAXALT-MLT) 10 MG disintegrating tablet Take by mouth. 03/05/21   [provider]  sertraline (ZOLOFT) 25 MG tablet Take by mouth. 01/29/20   [provider]  sertraline (ZOLOFT) 50 MG tablet Take 1 tablet by mouth daily. 04/30/20   [provider]      Allergies    Shellfish allergy    Review of Systems   Review of Systems  Constitutional:  Negative for chills and fever.  Gastrointestinal:  Negative for abdominal pain,  constipation, diarrhea, nausea and vomiting.  Neurological:  Positive for headaches. Negative for seizures, syncope, weakness and light-headedness.  All other systems reviewed and are negative.  Physical Exam Updated Vital Signs BP (!) 145/95    Pulse 68    Temp 98.2 F (36.8 C) (Oral)    Resp 18    Ht 5\' 4"  (1.626 m)    Wt (!) 144.7 kg    LMP 09/13/2021 (Approximate)    SpO2 100%    BMI 54.76 kg/m  Physical Exam Vitals and nursing note reviewed.  Constitutional:      Appearance: Normal appearance.  HENT:     Head: Normocephalic and atraumatic.  Eyes:     Conjunctiva/sclera: Conjunctivae normal.  Pulmonary:     Effort: Pulmonary effort is normal. No respiratory distress.  Skin:    General: Skin is warm and dry.  Neurological:     Mental Status: She is alert.     Comments: Neuro: Speech is clear, able to follow commands. CN III-XII intact grossly intact. PERRLA. EOMI. Sensation intact throughout. Str 5/5 all extremities.   Psychiatric:        Mood and Affect: Mood normal.        Behavior: Behavior normal.    ED Results / Procedures / Treatments   Labs (all labs ordered are listed, but only abnormal results are displayed) Labs Reviewed - No data to display  EKG None  Radiology No results found.  Procedures Procedures    Medications Ordered in ED Medications  sodium chloride 0.9 % bolus 1,000 mL (1,000 mLs Intravenous New Bag/Given 09/14/21 2237)  ketorolac (TORADOL) 15 MG/ML injection 15 mg (15 mg Intravenous Given 09/14/21 2238)  prochlorperazine (COMPAZINE) injection 10 mg (10 mg Intravenous Given 09/14/21 2237)  diphenhydrAMINE (BENADRYL) injection 12.5 mg (12.5 mg Intravenous Given 09/14/21 2237)    ED Course/ Medical Decision Making/ A&P                           Medical Decision Making Risk Prescription drug management.  Patient is a otherwise healthy 26 year old female with history of migraines who presents the emergency department complaining of a migraine  x2 days.  She has not tried anything at home for this.  No recent illness.  My exam patient is afebrile, not tachycardic, no acute distress.  Normal neurologic exam as above.  She is not ill-appearing.  Patient given migraine cocktail, and on reevaluation she states that her symptoms have resolved and she is anxious to go home.  I have low concern for other acute etiologies causing her symptoms today and am reassured by the fact her headache was improved with the medicines given. She not requiring mission or inpatient treatment for her symptoms.  She stable for discharge home.  We discussed reasons return to the emergency department, and patient is agreeable to the plan.  Final Clinical Impression(s) / ED Diagnoses Final diagnoses:  Intractable migraine without aura and without status migrainosus    Rx / DC Orders ED Discharge Orders     None      Portions of this report may have been transcribed using voice recognition software. Every effort was made to ensure accuracy; however, inadvertent computerized transcription errors may be present.    Su Monks, PA-C 09/14/21 2317    Melene Plan, DO 09/15/21 [phone]

## 2021-10-31 ENCOUNTER — Encounter (HOSPITAL_BASED_OUTPATIENT_CLINIC_OR_DEPARTMENT_OTHER): Payer: Self-pay | Admitting: Emergency Medicine

## 2021-10-31 ENCOUNTER — Emergency Department (HOSPITAL_BASED_OUTPATIENT_CLINIC_OR_DEPARTMENT_OTHER): Payer: Medicaid Other

## 2021-10-31 ENCOUNTER — Emergency Department (HOSPITAL_BASED_OUTPATIENT_CLINIC_OR_DEPARTMENT_OTHER)
Admission: EM | Admit: 2021-10-31 | Discharge: 2021-10-31 | Disposition: A | Discharge status: Home / Self Care | Payer: Medicaid Other | Attending: Emergency Medicine | Admitting: Emergency Medicine

## 2021-10-31 ENCOUNTER — Other Ambulatory Visit: Payer: Self-pay

## 2021-10-31 DIAGNOSIS — R1032 Left lower quadrant pain: Secondary | ICD-10-CM | POA: Insufficient documentation

## 2021-10-31 DIAGNOSIS — S39012A Strain of muscle, fascia and tendon of lower back, initial encounter: Secondary | ICD-10-CM | POA: Diagnosis not present

## 2021-10-31 DIAGNOSIS — X58XXXA Exposure to other specified factors, initial encounter: Secondary | ICD-10-CM | POA: Insufficient documentation

## 2021-10-31 DIAGNOSIS — J45909 Unspecified asthma, uncomplicated: Secondary | ICD-10-CM | POA: Insufficient documentation

## 2021-10-31 DIAGNOSIS — Z7951 Long term (current) use of inhaled steroids: Secondary | ICD-10-CM | POA: Diagnosis not present

## 2021-10-31 DIAGNOSIS — Y93E5 Activity, floor mopping and cleaning: Secondary | ICD-10-CM | POA: Insufficient documentation

## 2021-10-31 DIAGNOSIS — S3992XA Unspecified injury of lower back, initial encounter: Secondary | ICD-10-CM | POA: Diagnosis present

## 2021-10-31 DIAGNOSIS — R1031 Right lower quadrant pain: Secondary | ICD-10-CM | POA: Diagnosis not present

## 2021-10-31 LAB — PREGNANCY, URINE: Preg Test, Ur: NEGATIVE

## 2021-10-31 MED ORDER — DICLOFENAC SODIUM 50 MG PO TBEC: 50.0000 mg | DELAYED_RELEASE_TABLET | Freq: Two times a day (BID) | ORAL | 0 refills | Status: AC

## 2021-10-31 MED ORDER — KETOROLAC TROMETHAMINE 15 MG/ML IJ SOLN
15.0000 mg | Freq: Once | INTRAMUSCULAR | Status: AC
  Administered 2021-10-31: 15 mg via INTRAMUSCULAR
  Filled 2021-10-31: qty 1

## 2021-10-31 MED ORDER — METHOCARBAMOL 500 MG PO TABS: 500.0000 mg | ORAL_TABLET | Freq: Two times a day (BID) | ORAL | 0 refills | Status: DC

## 2021-10-31 NOTE — ED Provider Notes (Signed)
?[facility] EMERGENCY DEPARTMENT ?Provider Note ? ? ?CSN: [id number] ?Arrival date & time: 10/31/21  1336 ? ?  ? ?History ? ?Chief Complaint  ?Patient presents with  ? Back Pain  ? ? ?Autumn Duarte is a 26 y.o. female. ? ?26 year old female presents with complaint of pain in her low back onset Wednesday of last week (4 days ago).  States that she has been doing some cleaning lately but denies significant falls or injuries.  Patient states pain started radiating from her mid lower back down both of her posterior thighs after a few days.  Currently radiates around to left and right lower abdomen causing lower abdominal cramping.  Denies changes in bowel or bladder habits, fevers, nausea, vomiting.  Patient is not taking anything for her pain.  No prior back injuries.  No other complaints or concerns. ?Past medical history of bronchitis, asthma, obesity ? ? ?  ? ?Home Medications ?Prior to Admission medications   ?Medication Sig Start Date End Date Taking? Authorizing Provider  ?diclofenac (VOLTAREN) 50 MG EC tablet Take 1 tablet (50 mg total) by mouth 2 (two) times daily for 10 days. 10/31/21 11/10/21 Yes Jeannie FendMurphy, Jorryn Hershberger A, PA-C  ?methocarbamol (ROBAXIN) 500 MG tablet Take 1 tablet (500 mg total) by mouth 2 (two) times daily. 10/31/21  Yes Jeannie FendMurphy, Lynzy Rawles A, PA-C  ?Cholecalciferol 1.25 MG (50000 UT) capsule Take by mouth. 11/18/19   [provider]  ?levonorgestrel-ethinyl estradiol (NORDETTE) 0.15-30 MG-MCG tablet Take 1 tablet by mouth daily. 04/15/21   Burleson, Brand Maleserri L, NP  ?ondansetron (ZOFRAN) 4 MG tablet Take 1 tablet (4 mg total) by mouth every 6 (six) hours. 09/01/21   Gwyneth SproutPlunkett, Whitney, MD  ?[facility] 108 [phone](90 Base) MCG/ACT inhaler Inhale into the lungs. 04/01/21   [provider]  ?rizatriptan (MAXALT-MLT) 10 MG disintegrating tablet Take by mouth. 03/05/21   [provider]  ?sertraline (ZOLOFT) 25 MG tablet Take by mouth. 01/29/20   [provider]  ?sertraline (ZOLOFT) 50 MG  tablet Take 1 tablet by mouth daily. 04/30/20   [provider]  ?   ? ?Allergies    ?Shellfish allergy   ? ?Review of Systems   ?Review of Systems ?Negative except as per HPI ?Physical Exam ?Updated Vital Signs ?BP (!) 141/99   Pulse 75   Temp 98.2 ?F (36.8 ?C) (Oral)   Resp 17   Ht 5\' 3"  (1.6 m)   Wt (!) 143.3 kg   LMP 10/11/2021 Comment: neg preg test 10/31/21  SpO2 100%   BMI 55.98 kg/m?  ?Physical Exam ?Vitals and nursing note reviewed.  ?Constitutional:   ?   General: She is not in acute distress. ?   Appearance: She is well-developed. She is obese. She is not diaphoretic.  ?HENT:  ?   Head: Normocephalic and atraumatic.  ?Pulmonary:  ?   Effort: Pulmonary effort is normal.  ?Abdominal:  ?   Palpations: Abdomen is soft.  ?   Tenderness: There is abdominal tenderness in the right lower quadrant and left lower quadrant. There is no right CVA tenderness, left CVA tenderness, guarding or rebound.  ?Musculoskeletal:     ?   General: Tenderness present. No swelling or deformity.  ?   Thoracic back: No tenderness or bony tenderness.  ?   Lumbar back: Tenderness present. No bony tenderness. Positive left straight leg raise test. Negative right straight leg raise test.  ?     Back: ? ?Skin: ?   General: Skin  is warm and dry.  ?   Findings: No erythema or rash.  ?Neurological:  ?   Mental Status: She is alert and oriented to person, place, and time.  ?   Sensory: No sensory deficit.  ?   Motor: No weakness.  ?   Deep Tendon Reflexes: Reflexes normal.  ?Psychiatric:     ?   Behavior: Behavior normal.  ? ? ?ED Results / Procedures / Treatments   ?Labs ?(all labs ordered are listed, but only abnormal results are displayed) ?Labs Reviewed  ?PREGNANCY, URINE  ? ? ?EKG ?None ? ?Radiology ?DG Lumbar Spine Complete ? ?Result Date: 10/31/2021 ?CLINICAL DATA:  Low back pain. EXAM: LUMBAR SPINE - COMPLETE 4+ VIEW COMPARISON:  None. FINDINGS: There is no evidence of lumbar spine fracture. Alignment is normal.  Intervertebral disc spaces are maintained. IMPRESSION: Negative. Electronically Signed   By: Darliss Cheney M.D.   On: 10/31/2021 15:07   ? ?Procedures ?Procedures  ? ? ?Medications Ordered in ED ?Medications  ?ketorolac (TORADOL) 15 MG/ML injection 15 mg (has no administration in time range)  ? ? ?ED Course/ Medical Decision Making/ A&P ?  ?                        ?Medical Decision Making ?Amount and/or Complexity of Data Reviewed ?Labs: ordered. ?Radiology: ordered. ? ? ?This patient presents to the ED for concern of low back pain, this involves an extensive number of treatment options, and is a complaint that carries with it a high risk of complications and morbidity.  The differential diagnosis includes but not limited to herniated disc, radiculopathy, lumbar strain ? ? ?Co morbidities that complicate the patient evaluation ? ?Obesity ? ? ?Additional history obtained: ? ?External records from outside source obtained and reviewed including no recent relevant records ? ? ?Lab Tests: ? ?I Ordered, and personally interpreted labs.  The pertinent results include:  upreg negative  ? ? ?Imaging Studies ordered: ? ?I ordered imaging studies including x-Markes lumbar spine ?I independently visualized and interpreted imaging which showed unremarkable ?I agree with the radiologist interpretation ? ? ?Problem List / ED Course / Critical interventions / Medication management ? ?Low back pain as above, radiates to abdomen and down legs.  Found to have mild lower abdominal tenderness without associated symptoms of nausea, vomiting, changes in bowel or bladder habits.  Straight leg raise positive with left leg extension, does have tenderness to left lower back with palpation.  X-Sangster lumbar spine unremarkable, pregnancy test negative.  Treated here with IM Toradol, will discharge with Robaxin and diclofenac.  Recommend warm compresses to low back followed by gentle stretching with plan to recheck with PCP, may benefit from referral  for physical therapy.  Return to ED for worsening or concerning symptoms. ?I ordered medication including Toradol for low back pain ?Reevaluation of the patient after these medicines showed that the patient  administered at time of discharge ?I have reviewed the patients home medicines and have made adjustments as needed ? ? ? ? ? ? ? ?Final Clinical Impression(s) / ED Diagnoses ?Final diagnoses:  ?Strain of lumbar region, initial encounter  ? ? ?Rx / DC Orders ?ED Discharge Orders   ? ?      Ordered  ?  methocarbamol (ROBAXIN) 500 MG tablet  2 times daily       ? 10/31/21 1530  ?  diclofenac (VOLTAREN) 50 MG EC tablet  2 times daily       ?  10/31/21 1530  ? ?  ?  ? ?  ? ? ?  ?Jeannie Fend, PA-C ?10/31/21 1536 ? ?  ?Terald Sleeper, MD ?11/01/21 [phone] ? ?

## 2021-10-31 NOTE — ED Triage Notes (Signed)
Pt arrives pov with c/o lower back pain x 1 week, worsening last night, radiating to right hip. Denies injury ?

## 2021-10-31 NOTE — Discharge Instructions (Addendum)
Medications as prescribed.  Do not drive or operate machinery on taking Robaxin and sleep as medication affects you.  Warm compresses followed by gentle stretching as discussed.  Recheck with your primary care provider.  Return to ED for new or worsening symptoms. ?

## 2021-12-20 ENCOUNTER — Encounter: Payer: Self-pay | Admitting: Obstetrics and Gynecology

## 2021-12-20 ENCOUNTER — Ambulatory Visit (INDEPENDENT_AMBULATORY_CARE_PROVIDER_SITE_OTHER): Payer: Medicaid Other | Admitting: Obstetrics and Gynecology

## 2021-12-20 ENCOUNTER — Other Ambulatory Visit (HOSPITAL_COMMUNITY)
Admission: RE | Admit: 2021-12-20 | Discharge: 2021-12-20 | Disposition: A | Discharge status: Home / Self Care | Payer: Medicaid Other | Source: Ambulatory Visit | Attending: Obstetrics and Gynecology | Admitting: Obstetrics and Gynecology

## 2021-12-20 VITALS — BP 127/84 | HR 103 | Ht 64.0 in | Wt 333.3 lb

## 2021-12-20 DIAGNOSIS — Z01419 Encounter for gynecological examination (general) (routine) without abnormal findings: Secondary | ICD-10-CM

## 2021-12-20 DIAGNOSIS — N912 Amenorrhea, unspecified: Secondary | ICD-10-CM

## 2021-12-20 DIAGNOSIS — Z124 Encounter for screening for malignant neoplasm of cervix: Secondary | ICD-10-CM | POA: Diagnosis not present

## 2021-12-20 DIAGNOSIS — Z6841 Body Mass Index (BMI) 40.0 and over, adult: Secondary | ICD-10-CM

## 2021-12-20 DIAGNOSIS — Z113 Encounter for screening for infections with a predominantly sexual mode of transmission: Secondary | ICD-10-CM | POA: Insufficient documentation

## 2021-12-20 LAB — POCT PREGNANCY, URINE: Preg Test, Ur: NEGATIVE

## 2021-12-20 NOTE — Progress Notes (Signed)
  Pt states has not had a cycle this month, took 2 home pregnancy test states line were very faint. Pt explained that when she was pregnant with twins, was not detected using UPT, only thru Blood test.

## 2021-12-20 NOTE — Progress Notes (Signed)
GYNECOLOGY ANNUAL PREVENTATIVE CARE ENCOUNTER NOTE  History:     Autumn Duarte is a 26 y.o. G36P1002 female here for a routine annual gynecologic exam.  Current complaints: amenorrhea with slightly positive pregnancy test x 2 at home. Slightly positive pregnancy test in office today as well  Denies abnormal vaginal bleeding, discharge, pelvic pain, problems with intercourse or other gynecologic concerns.    Gynecologic History Patient's last menstrual period was 11/11/2021 (exact date). Contraception: none Last Pap: 01/28/22. Results were: normal with negative HPV   Obstetric History OB History  Gravida Para Term Preterm AB Living  1 1 1     2   SAB IAB Ectopic Multiple Live Births        1 2    # Outcome Date GA Lbr Len/2nd Weight Sex Delivery Anes PTL Lv  1A Term 12/04/18 [redacted]w[redacted]d 11:32 / 01:20 6 lb 1.5 oz (2.764 kg) F Vag-Spont EPI  LIV     Birth Comments: WDL  1B Term 12/04/18 [redacted]w[redacted]d 11:32 / 01:40 5 lb 10.1 oz (2.554 kg) F Vag-Spont EPI  LIV     Birth Comments: WDL    Past Medical History:  Diagnosis Date   Asthma    last used inhaler 2 months ago   Bronchitis    Eczema    Obesity    Seasonal allergies     Past Surgical History:  Procedure Laterality Date   WISDOM TOOTH EXTRACTION      Current Outpatient Medications on File Prior to Visit  Medication Sig Dispense Refill   PROAIR HFA 108 (90 Base) MCG/ACT inhaler Inhale into the lungs.     sertraline (ZOLOFT) 50 MG tablet Take 1 tablet by mouth daily.     Cholecalciferol 1.25 MG (50000 UT) capsule Take by mouth. (Patient not taking: Reported on 12/20/2021)     levonorgestrel-ethinyl estradiol (NORDETTE) 0.15-30 MG-MCG tablet Take 1 tablet by mouth daily. (Patient not taking: Reported on 12/20/2021) 28 tablet 2   methocarbamol (ROBAXIN) 500 MG tablet Take 1 tablet (500 mg total) by mouth 2 (two) times daily. 20 tablet 0   ondansetron (ZOFRAN) 4 MG tablet Take 1 tablet (4 mg total) by mouth every 6 (six) hours. (Patient  not taking: Reported on 12/20/2021) 12 tablet 0   rizatriptan (MAXALT-MLT) 10 MG disintegrating tablet Take by mouth.     sertraline (ZOLOFT) 25 MG tablet Take by mouth.     No current facility-administered medications on file prior to visit.    Allergies  Allergen Reactions   Shellfish Allergy Itching, Shortness Of Breath and Hives    Social History:  reports that she quit smoking about 3 years ago. Her smoking use included cigarettes. She has never used smokeless tobacco. She reports that she does not drink alcohol and does not use drugs.  Family History  Problem Relation Age of Onset   Arthritis Mother    Hepatitis Mother    Thyroid disease Mother    Diabetes Father    Heart disease Father    Hypertension Father    Breast cancer Maternal Aunt    Breast cancer Maternal Grandmother     The following portions of the patient's history were reviewed and updated as appropriate: allergies, current medications, past family history, past medical history, past social history, past surgical history and problem list.  Review of Systems Pertinent items noted in HPI and remainder of comprehensive ROS otherwise negative.  Physical Exam:  BP 127/84   Pulse (!) 103  Ht 5\' 4"  (1.626 m)   Wt (!) 333 lb 4.8 oz (151.2 kg)   LMP 11/11/2021 (Exact Date)   BMI 57.21 kg/m  CONSTITUTIONAL: Well-developed, well-nourished female in no acute distress.  HENT:  Normocephalic, atraumatic, External right and left ear normal. Oropharynx is clear and moist EYES: Conjunctivae and EOM are normal. NECK: Normal range of motion, supple, no masses.  Normal thyroid.  SKIN: Skin is warm and dry. No rash noted. Not diaphoretic. No erythema. No pallor.  Mild acanthosis nigricans noted MUSCULOSKELETAL: Normal range of motion. No tenderness.  No cyanosis, clubbing, or edema.  2+ distal pulses. NEUROLOGIC: Alert and oriented to person, place, and time. Normal reflexes, muscle tone coordination.  PSYCHIATRIC: Normal  mood and affect. Normal behavior. Normal judgment and thought content. CARDIOVASCULAR: Normal heart rate noted, regular rhythm RESPIRATORY: Clear to auscultation bilaterally. Effort and breath sounds normal, no problems with respiration noted. BREASTS: deferred ABDOMEN: Soft, no distention noted.  No tenderness, rebound or guarding.  PELVIC: Normal appearing external genitalia and urethral meatus; normal appearing vaginal mucosa and cervix.  No abnormal discharge noted.  Pap smear obtained.  Vaginal swab taken.  Normal uterine size, no other palpable masses, no uterine or adnexal tenderness.  Performed in the presence of a chaperone.   Assessment and Plan:    1. Screening for STD (sexually transmitted disease) Per pt request - Cervicovaginal ancillary only( [place]) - HIV Antibody (routine testing w rflx) - RPR - Hepatitis C Antibody - Hepatitis B Surface AntiGEN  2. Cervical cancer screening  - Cytology - PAP( [place])  3. Women's annual routine gynecological examination Normal annual exam  4. Routine screening for STI (sexually transmitted infection)   5. Amenorrhea Due to 3 slightly positive urine pregnancy tests, will check bhcg to see if patient has chemical pregnancy - Beta hCG quant (ref lab)  6. BMI 50.0-59.9, adult (HCC) Pt strongly advised to consider weight loss to optimize possible pregnancy in the future  Will follow up results of pap smear and manage accordingly. Routine preventative health maintenance measures emphasized. Please refer to After Visit Summary for other counseling recommendations.      11-27-1994, MD, FACOG Obstetrician & Gynecologist, [facility], [facility]

## 2021-12-21 ENCOUNTER — Encounter: Payer: Self-pay | Admitting: Obstetrics and Gynecology

## 2021-12-21 ENCOUNTER — Other Ambulatory Visit: Payer: Self-pay

## 2021-12-21 DIAGNOSIS — Z3490 Encounter for supervision of normal pregnancy, unspecified, unspecified trimester: Secondary | ICD-10-CM

## 2021-12-21 DIAGNOSIS — J4520 Mild intermittent asthma, uncomplicated: Secondary | ICD-10-CM | POA: Insufficient documentation

## 2021-12-21 LAB — CERVICOVAGINAL ANCILLARY ONLY
Bacterial Vaginitis (gardnerella): POSITIVE — AB
Candida Glabrata: NEGATIVE
Candida Vaginitis: NEGATIVE
Chlamydia: NEGATIVE
Comment: NEGATIVE
Comment: NEGATIVE
Comment: NEGATIVE
Comment: NEGATIVE
Comment: NEGATIVE
Comment: NORMAL
Neisseria Gonorrhea: NEGATIVE
Trichomonas: NEGATIVE

## 2021-12-21 LAB — CYTOLOGY - PAP: Diagnosis: NEGATIVE

## 2021-12-21 LAB — HEPATITIS B SURFACE ANTIGEN: Hepatitis B Surface Ag: NEGATIVE

## 2021-12-21 LAB — RPR: RPR Ser Ql: NONREACTIVE

## 2021-12-21 LAB — HEPATITIS C ANTIBODY: Hep C Virus Ab: NONREACTIVE

## 2021-12-21 LAB — BETA HCG QUANT (REF LAB): hCG Quant: 66 m[IU]/mL

## 2021-12-21 LAB — HIV ANTIBODY (ROUTINE TESTING W REFLEX): HIV Screen 4th Generation wRfx: NONREACTIVE

## 2021-12-22 ENCOUNTER — Other Ambulatory Visit: Payer: Medicaid Other

## 2021-12-22 DIAGNOSIS — Z3490 Encounter for supervision of normal pregnancy, unspecified, unspecified trimester: Secondary | ICD-10-CM

## 2021-12-23 ENCOUNTER — Telehealth: Payer: Self-pay | Admitting: Lactation Services

## 2021-12-23 LAB — BETA HCG QUANT (REF LAB): hCG Quant: 177 m[IU]/mL

## 2021-12-23 MED ORDER — METRONIDAZOLE 500 MG PO TABS: 500.0000 mg | ORAL_TABLET | Freq: Two times a day (BID) | ORAL | 0 refills | Status: DC

## 2021-12-23 MED ORDER — PRENATAL PLUS 27-1 MG PO TABS: 1.0000 | ORAL_TABLET | Freq: Every day | ORAL | 11 refills | Status: DC

## 2021-12-23 NOTE — Telephone Encounter (Signed)
-----   Message from Warden Fillers, MD sent at 12/22/2021 11:32 AM EDT ----- Pap normal, vaginal swab showed bacterial vaginosis, will offer treatment

## 2021-12-23 NOTE — Telephone Encounter (Signed)
Called and spoke with patient. She was informed of BV. Patient would like treatment, she denies any s/s BV or vaginal discharge.   She would like to know her Beta Hcg. She had a regular cycle on March and a 2 day cycle on April 13-14, which as light for her. Per Chart review, patient is to have [place] to determine dating if Hcg rising and determine when to start prenatal care. Patient is not taking PNV, offered and patient agreed for prescription to be sent in.   Will route to Dr. Donavan Foil to determine if [place] needs to be scheduled. Patient prefers [place] to be scheduled on Monday anytime or morning any other days.

## 2021-12-24 ENCOUNTER — Encounter (HOSPITAL_COMMUNITY): Payer: Self-pay | Admitting: *Deleted

## 2021-12-24 ENCOUNTER — Inpatient Hospital Stay (HOSPITAL_COMMUNITY)
Admission: AD | Admit: 2021-12-24 | Discharge: 2021-12-24 | Disposition: A | Discharge status: Home / Self Care | Payer: Medicaid Other | Attending: Obstetrics and Gynecology | Admitting: Obstetrics and Gynecology

## 2021-12-24 ENCOUNTER — Inpatient Hospital Stay (HOSPITAL_COMMUNITY): Payer: Medicaid Other

## 2021-12-24 DIAGNOSIS — O3680X0 Pregnancy with inconclusive fetal viability, not applicable or unspecified: Secondary | ICD-10-CM | POA: Diagnosis not present

## 2021-12-24 DIAGNOSIS — M545 Low back pain, unspecified: Secondary | ICD-10-CM | POA: Diagnosis not present

## 2021-12-24 DIAGNOSIS — O26891 Other specified pregnancy related conditions, first trimester: Secondary | ICD-10-CM | POA: Diagnosis present

## 2021-12-24 DIAGNOSIS — O99211 Obesity complicating pregnancy, first trimester: Secondary | ICD-10-CM | POA: Diagnosis not present

## 2021-12-24 DIAGNOSIS — R1030 Lower abdominal pain, unspecified: Secondary | ICD-10-CM | POA: Diagnosis not present

## 2021-12-24 LAB — COMPREHENSIVE METABOLIC PANEL
ALT: 22 U/L (ref 0–44)
AST: 16 U/L (ref 15–41)
Albumin: 3.2 g/dL — ABNORMAL LOW (ref 3.5–5.0)
Alkaline Phosphatase: 30 U/L — ABNORMAL LOW (ref 38–126)
Anion gap: 6 (ref 5–15)
BUN: 10 mg/dL (ref 6–20)
CO2: 23 mmol/L (ref 22–32)
Calcium: 8.7 mg/dL — ABNORMAL LOW (ref 8.9–10.3)
Chloride: 108 mmol/L (ref 98–111)
Creatinine, Ser: 0.62 mg/dL (ref 0.44–1.00)
GFR, Estimated: >60 mL/min (ref >60)
Glucose, Bld: 123 mg/dL — ABNORMAL HIGH (ref 70–99)
Potassium: 3.9 mmol/L (ref 3.5–5.1)
Sodium: 137 mmol/L (ref 135–145)
Total Bilirubin: 0.4 mg/dL (ref 0.3–1.2)
Total Protein: 7 g/dL (ref 6.5–8.1)

## 2021-12-24 LAB — URINALYSIS, ROUTINE W REFLEX MICROSCOPIC
Bilirubin Urine: NEGATIVE
Glucose, UA: NEGATIVE mg/dL
Ketones, ur: NEGATIVE mg/dL
Nitrite: NEGATIVE
Protein, ur: NEGATIVE mg/dL
Specific Gravity, Urine: 1.024 (ref 1.005–1.030)
pH: 6 (ref 5.0–8.0)

## 2021-12-24 LAB — CBC
HCT: 38.4 % (ref 36.0–46.0)
Hemoglobin: 13.1 g/dL (ref 12.0–15.0)
MCH: 30 pg (ref 26.0–34.0)
MCHC: 34.1 g/dL (ref 30.0–36.0)
MCV: 87.9 fL (ref 80.0–100.0)
Platelets: 302 10*3/uL (ref 150–400)
RBC: 4.37 MIL/uL (ref 3.87–5.11)
RDW: 12.6 % (ref 11.5–15.5)
WBC: 10.6 10*3/uL — ABNORMAL HIGH (ref 4.0–10.5)
nRBC: 0 % (ref 0.0–0.2)

## 2021-12-24 LAB — TYPE AND SCREEN
ABO/RH(D): O POS
Antibody Screen: NEGATIVE

## 2021-12-24 LAB — HCG, QUANTITATIVE, PREGNANCY: hCG, Beta Chain, Quant, S: 611 m[IU]/mL — ABNORMAL HIGH (ref <5)

## 2021-12-24 MED ORDER — HYDROMORPHONE HCL 1 MG/ML IJ SOLN
1.0000 mg | INTRAMUSCULAR | Status: DC | PRN
  Administered 2021-12-24: 1 mg via INTRAMUSCULAR
  Filled 2021-12-24: qty 1

## 2021-12-24 NOTE — MAU Note (Signed)
Autumn Duarte is a 26 y.o. at Unknown here in MAU reporting: spoke to dr yesterday, told them the pain in her abd and low back has gotten worse.  Up since 0300, pain has gotten.  Worse. Denies bleeding.  No F/U plan from blood work.   Onset of complaint: yesterday, back pain, abd pain 0600 Pain score: 6/10 w/movement Vitals:   12/24/21 1444  BP: 121/67  Pulse: 82  Resp: 18  Temp: 98.3 F (36.8 C)  SpO2: 97%     Lab orders placed from triage:  urine

## 2021-12-24 NOTE — MAU Provider Note (Signed)
History     CSN: [id number]  Arrival date and time: 12/24/21 1349   Event Date/Time   First Provider Initiated Contact with Patient 12/24/21 1705      Chief Complaint  Patient presents with   Abdominal Pain   Back Pain   HPI  Autumn Duarte is a 26 y.o. G2P1002 in early pregnancy who presents to MAU with chief complaint of recurrent low back pain and recurrent lower abdominal pain. Pain started earlier this week. Patient has attended outpatient appointments for serial quant hCGs but states her pain is not getting better and she does not know the followup plan for her care. Her abdominal pain score is 4/10. Her low back pain score is 6/10. She denies aggravating or alleviating factors. She has not taken medication for these complaints.   Patient also reports recurrent vaginal spotting. She denies abnormal vaginal discharge, abdominal tenderness, fever or recent illness.  OB History     Gravida  2   Para  1   Term  1   Preterm      AB      Living  2      SAB      IAB      Ectopic      Multiple  1   Live Births  2           Past Medical History:  Diagnosis Date   Asthma    last used inhaler 2 months ago   Bronchitis    Eczema    Obesity    Seasonal allergies     Past Surgical History:  Procedure Laterality Date   WISDOM TOOTH EXTRACTION      Family History  Problem Relation Age of Onset   Arthritis Mother    Hepatitis Mother    Thyroid disease Mother    Diabetes Father    Heart disease Father    Hypertension Father    Breast cancer Maternal Aunt    Breast cancer Maternal Grandmother     Social History   Tobacco Use   Smoking status: Former    Types: Cigarettes    Quit date: 03/19/2018    Years since quitting: 3.7   Smokeless tobacco: Never  Vaping Use   Vaping Use: Never used  Substance Use Topics   Alcohol use: Never   Drug use: Never    Allergies:  Allergies  Allergen Reactions   Shellfish Allergy Itching, Shortness Of  Breath and Hives    Medications Prior to Admission  Medication Sig Dispense Refill Last Dose   Cholecalciferol 1.25 MG (50000 UT) capsule Take by mouth. (Patient not taking: Reported on 12/20/2021)      levonorgestrel-ethinyl estradiol (NORDETTE) 0.15-30 MG-MCG tablet Take 1 tablet by mouth daily. (Patient not taking: Reported on 12/20/2021) 28 tablet 2    methocarbamol (ROBAXIN) 500 MG tablet Take 1 tablet (500 mg total) by mouth 2 (two) times daily. 20 tablet 0    metroNIDAZOLE (FLAGYL) 500 MG tablet Take 1 tablet (500 mg total) by mouth 2 (two) times daily. 14 tablet 0    ondansetron (ZOFRAN) 4 MG tablet Take 1 tablet (4 mg total) by mouth every 6 (six) hours. (Patient not taking: Reported on 12/20/2021) 12 tablet 0    prenatal vitamin w/FE, FA (PRENATAL 1 + 1) 27-1 MG TABS tablet Take 1 tablet by mouth daily at 12 noon. 30 tablet 11    PROAIR HFA 108 (90 Base) MCG/ACT inhaler Inhale into the lungs.  rizatriptan (MAXALT-MLT) 10 MG disintegrating tablet Take by mouth.      sertraline (ZOLOFT) 25 MG tablet Take by mouth.      sertraline (ZOLOFT) 50 MG tablet Take 1 tablet by mouth daily.       Review of Systems  Gastrointestinal:  Positive for abdominal pain.  Genitourinary:  Positive for vaginal bleeding.  Musculoskeletal:  Positive for back pain.  All other systems reviewed and are negative. Physical Exam   Blood pressure 138/71, pulse 86, temperature 98.3 F (36.8 C), temperature source Oral, resp. rate 18, height 5\' 4"  (1.626 m), weight (!) 151.8 kg, last menstrual period 11/11/2021, SpO2 97 %.  Physical Exam Vitals and nursing note reviewed. Exam conducted with a chaperone present.  Constitutional:      Appearance: She is well-developed. She is obese.  Cardiovascular:     Rate and Rhythm: Normal rate and regular rhythm.     Heart sounds: Normal heart sounds.  Pulmonary:     Effort: Pulmonary effort is normal.     Breath sounds: Normal breath sounds.  Abdominal:      General: Bowel sounds are normal.     Palpations: Abdomen is soft.     Tenderness: There is no abdominal tenderness.  Skin:    Capillary Refill: Capillary refill takes less than 2 seconds.  Neurological:     Mental Status: She is alert and oriented to person, place, and time.  Psychiatric:        Mood and Affect: Mood normal.        Behavior: Behavior normal.    MAU Course  Procedures  MDM  --Pain resolved with one dose of IM Dilaudid and did not recur --Ultrasound images reviewed by Dr. Para Marchuncan. Dr. Para Marchuncan at bedside to discuss recommendation for additional Quant hCG --Signs of worsening acuity reviewed by both Dr. Para Marchuncan and CNM prior to discharge. Teachback completed by patient and her sister  Orders Placed This Encounter  Procedures   US OB Transvaginal   Urinalysis, Routine w reflex microscopic Urine, Clean Catch   CBC   hCG, quantitative, pregnancy   Comprehensive metabolic panel   Type and screen [facility]   Patient Vitals for the past 24 hrs:  BP Temp Temp src Pulse Resp SpO2 Height Weight  12/24/21 1501 138/71 -- -- 86 -- -- -- --  12/24/21 1444 121/67 98.3 F (36.8 C) Oral 82 18 97 % 5\' 4"  (1.626 m) (!) 151.8 kg   Results for orders placed or performed during the hospital encounter of 12/24/21 (from the past 24 hour(s))  CBC     Status: Abnormal   Collection Time: 12/24/21  3:30 PM  Result Value Ref Range   WBC 10.6 (H) 4.0 - 10.5 K/uL   RBC 4.37 3.87 - 5.11 MIL/uL   Hemoglobin 13.1 12.0 - 15.0 g/dL   HCT 95.638.4 21.336.0 - 08.646.0 %   MCV 87.9 80.0 - 100.0 fL   MCH 30.0 26.0 - 34.0 pg   MCHC 34.1 30.0 - 36.0 g/dL   RDW 57.812.6 46.911.5 - 62.915.5 %   Platelets 302 150 - 400 K/uL   nRBC 0.0 0.0 - 0.2 %  hCG, quantitative, pregnancy     Status: Abnormal   Collection Time: 12/24/21  3:30 PM  Result Value Ref Range   hCG, Beta Chain, Quant, S 611 (H) <5 mIU/mL  Urinalysis, Routine w reflex microscopic Urine, Clean Catch     Status: Abnormal   Collection  Time: 12/24/21  3:41 PM  Result Value Ref Range   Color, Urine YELLOW YELLOW   APPearance HAZY (A) CLEAR   Specific Gravity, Urine 1.024 1.005 - 1.030   pH 6.0 5.0 - 8.0   Glucose, UA NEGATIVE NEGATIVE mg/dL   Hgb urine dipstick SMALL (A) NEGATIVE   Bilirubin Urine NEGATIVE NEGATIVE   Ketones, ur NEGATIVE NEGATIVE mg/dL   Protein, ur NEGATIVE NEGATIVE mg/dL   Nitrite NEGATIVE NEGATIVE   Leukocytes,Ua LARGE (A) NEGATIVE   RBC / HPF 0-5 0 - 5 RBC/hpf   WBC, UA 6-10 0 - 5 WBC/hpf   Bacteria, UA RARE (A) NONE SEEN   Squamous Epithelial / LPF 11-20 0 - 5   Mucus PRESENT    Amorphous Crystal PRESENT    US OB Transvaginal  Result Date: 12/24/2021 CLINICAL DATA:  Pregnancy of unknown location. EXAM: TRANSVAGINAL OB ULTRASOUND TECHNIQUE: Transvaginal ultrasound was performed for complete evaluation of the gestation as well as the maternal uterus, adnexal regions, and pelvic cul-de-sac. COMPARISON:  None Available. FINDINGS: Intrauterine gestational sac: None Yolk sac:  Not Visualized. Embryo:  Not Visualized. Cardiac Activity: Not Visualized. Subchorionic hemorrhage:  None visualized. Maternal uterus/adnexae: Right ovary: Appears normal containing a corpus luteum Left ovary: Appears normal. Other :Homogeneous thickening of the endometrium is identified measuring 10 mm in thickness. Solid and cystic appearing structure separate from an adjacent to the right ovary measures 1.1 x 1.0 by 0.7 cm. Adjacent trace free fluid is noted. No significant increased blood flow within this structure. Free fluid: Small volume of free fluid noted within the right adnexa. IMPRESSION: 1. No intrauterine gestational sac, yolk sac, or fetal pole identified. Differential considerations include intrauterine pregnancy too early to be sonographically visualized, missed abortion, or ectopic pregnancy. Followup ultrasound is recommended in 10-14 days for further evaluation. 2. Indeterminate solid and cystic structure within the  right adnexa with adjacent, simple appearing free fluid is noted measuring up to 1.1 cm. An ectopic pregnancy cannot be excluded. 3. Critical Value/emergent results were called by telephone at the time of interpretation on 12/24/2021 at 4:52 pm to provider [facility] , who verbally acknowledged these results. Electronically Signed   By: Signa Kell M.D.   On: 12/24/2021 16:52    Assessment and Plan  --26 y.o. G2P1002 in early pregnancy --Appropriately rising quant hCG --Care coordinated with Dr. Para March --Discharge home in stable condition  F/U: Return to MAU Sunday after 1530 for repeat stat Quant hCG  Calvert Cantor, MSA, MSN, CNM 12/24/2021, 7:14 PM

## 2021-12-26 ENCOUNTER — Other Ambulatory Visit: Payer: Self-pay

## 2021-12-26 ENCOUNTER — Inpatient Hospital Stay (HOSPITAL_COMMUNITY)
Admission: AD | Admit: 2021-12-26 | Discharge: 2021-12-26 | Disposition: A | Discharge status: Home / Self Care | Payer: Medicaid Other | Attending: Family Medicine | Admitting: Family Medicine

## 2021-12-26 ENCOUNTER — Inpatient Hospital Stay (HOSPITAL_COMMUNITY)
Admission: RE | Admit: 2021-12-26 | Discharge: 2021-12-26 | Disposition: A | Discharge status: Home / Self Care | Payer: Medicaid Other | Source: Ambulatory Visit | Attending: Family Medicine | Admitting: Family Medicine

## 2021-12-26 DIAGNOSIS — O3680X0 Pregnancy with inconclusive fetal viability, not applicable or unspecified: Secondary | ICD-10-CM | POA: Diagnosis not present

## 2021-12-26 DIAGNOSIS — Z3A00 Weeks of gestation of pregnancy not specified: Secondary | ICD-10-CM | POA: Diagnosis not present

## 2021-12-26 LAB — HCG, QUANTITATIVE, PREGNANCY: hCG, Beta Chain, Quant, S: 1698 m[IU]/mL — ABNORMAL HIGH (ref <5)

## 2021-12-26 NOTE — Discharge Instructions (Signed)
Commonly Asked Questions During Pregnancy  How Will I Feel When I'm Pregnant? Pregnancy symptoms in the first trimester of pregnancy may not appear until the middle or end of the second month. Hormonal changes will cause tenderness in your breasts, and you may begin to feel more tired than usual. Food cravings, an increase in the need to urinate, and morning sickness may all be more noticeable.  Pregnancy symptoms in the second trimester are more prominent. You may start to feel the baby move and become more active. Dental issues, nasal/sinus problems, and skin irritations can begin to appear. Heartburn, leg cramps, dizziness, and a vaginal discharge are also common. Every woman is different when it comes to the symptoms they experience, and some may not experience any at all. Pregnancy symptoms in the third trimester can include increased frequency in urination, leg cramps, constipation, ligament pain in the abdomen, and weight gain. Back pain and Braxton Hicks contractions will become increasingly more common.  Why is nutrition during pregnancy important? Eating well is one of the best things you can do during pregnancy. Good nutrition helps you handle the extra demands on your body as your pregnancy progresses. The goal is to balance getting enough nutrients to support the growth of your fetus and maintaining a healthy weight.  How much water should I drink during pregnancy? During pregnancy you should drink 8 to 12 cups (64 to 96 ounces) of water every day. Water has many benefits. It aids digestion and helps form the amniotic fluid around the fetus. Water also helps nutrients circulate in the body and helps waste leave the body.  What can I do to help with nausea? Eat dry toast or crackers in the morning before you get out of bed to avoid moving around on an empty stomach. Eat five or six "mini meals" a day to ensure that your stomach is never empty. Eat frequent bites of foods like nuts,  fruits, or crackers.  What can help with constipation during pregnancy? Constipation is common near the end of pregnancy. Eating more foods with fiber can help fight constipation. Fiber is found in fruits, vegetables, whole grains, beans, nuts, and seeds. You should aim for about 25 grams of fiber in your diet each day. Drink a lot of water as you increase your fiber intake.  How much coffee can I drink while I'm pregnant? Research suggests that moderate caffeine consumption (less than 200 milligrams per day) does not cause miscarriage or preterm birth. That's the amount in one 12-ounce cup of coffee. Remember that caffeine also is found in tea, chocolate, energy drinks, and soft drinks. Caffeine can interfere with sleep and contribute to nausea and light-headedness. Caffeine also can increase urination and lead to dehydration.  What can I do to prevent or ease back pain during pregnancy? There are several things you can do to prevent or ease back pain. For example, wear supportive clothing and shoes. Pay attention to your position when sitting, sleeping, and lifting things. If you need to stand for a long time, rest one foot on a stool or a box to take the strain off your back. You also can use heat or cold to soothe sore muscles.  Is it safe to exercise during pregnancy? If you are healthy and your pregnancy is normal, it is safe to continue or start regular physical activity. Physical activity does not increase your risk of miscarriage, low birth weight, or early delivery. It's still important to discuss exercise with your ob-gyn  provider during your early prenatal visits.   What are the benefits of exercise during pregnancy? Regular exercise during pregnancy benefits you and your fetus in these key ways: Reduces back pain Eases constipation May decrease your risk of gestational diabetes, preeclampsia, and cesarean birth Promotes healthy weight gain during pregnancy Improves your overall  fitness and strengthens your heart and blood vessels Helps you to lose the baby weight after your baby is born  Is it safe to dye my hair during pregnancy? Yes, it's safe. Only a small amount of chemicals from hair dye is absorbed through the scalp.  Is it safe to keep a cat during pregnancy? Yes, you can keep your cat. You may have heard that cat feces can carry the infection toxoplasmosis. This infection is only found in cats who go outdoors and hunt prey, such as mice and other rodents. If you do have a cat who goes outdoors or eats prey, have someone else take over daily cleaning the litter box. This will keep you away from any cat feces. If you have an indoor cat who only eats cat food and doesn't have contact with outside animals, your risk of toxoplasmosis is very low.  What substances should I avoid during pregnancy? During pregnancy, women should not use tobacco, alcohol, marijuana, illegal drugs, or prescription medications for nonmedical reasons. Avoiding these substances and getting regular prenatal care are important to having a healthy pregnancy and a healthy baby.   What foods to I need to avoid in pregnancy? To help prevent listeriosis, avoid eating the following foods while you are pregnant: Unpasteurized milk and foods made with unpasteurized milk, including soft cheeses Hot dogs and luncheon meats, unless they are heated until steaming hot just before serving Unwashed raw produce such as fruits and vegetables  Avoid all raw and undercooked seafood, eggs, meat, and poultry while you are pregnant. Do not eat sushi made with raw fish (cooked sushi is safe). Cooking and pasteurization are the only ways to kill Listeria.  Limit your exposure to mercury by not eating bigeye tuna, king mackerel, marlin, orange roughy, shark, swordfish, or tilefish. Limit eating white (albacore) tuna to 6 ounces a week. You do not have to avoid all fish during pregnancy. In fact, fish and shellfish are  nutritious foods with vital nutrients for a pregnant woman and her fetus. Be sure to eat at least 8-12 ounces of low-mercury fish and shellfish per week.  Is travel safe to during pregnancy? In most cases, pregnant women can travel safely until close to their due dates. But travel may not be recommended for women who have pregnancy complications. If you are planning a trip, talk with your (ob-gyn) provider. And no matter how you choose to travel, think ahead about your comfort and safety.  Can I use a sauna or hot tub early in pregnancy? It's best not to. Your core body temperature rises when you use saunas and hot tubs. This rise in temperature can be harmful for your fetus.  Can I get a massage while pregnant? Yes. Massage is a good way to relax and improve circulation. The best position for a massage while you're pregnant is lying on your side, rather than facedown. Some massage tables have a cut-out for the belly, allowing you to lie facedown comfortably. Tell your massage therapist that you're pregnant if you're not showing yet. Many health spas offer special prenatal massages done by therapists who are trained to work on pregnant women.  Is Having Dental  Work While Pregnant Safe? Pregnancy and dental work questions are common for expecting moms. Preventive dental cleanings and annual exams during pregnancy are not only safe but are recommended. The rise in hormone levels during pregnancy causes the gums to swell, bleed, and trap food causing increased irritation to your gums. Preventive dental work while pregnant is essential to avoid oral infections such as gum disease, which has been linked to preterm birth. The American Dental Association (ADA) recommends pregnant women eat a balanced diet, brush their teeth thoroughly with ADA-approved fluoride toothpaste twice a day, and floss daily. Have preventive exams and cleanings during your pregnancy. Let your dentist know you are  pregnant. Postpone non-emergency dental work until the second trimester or after delivery, if possible. Elective procedures should be postponed until after the delivery.  

## 2021-12-26 NOTE — MAU Provider Note (Signed)
Event Date/Time  First Provider Initiated Contact with Patient 12/26/21 1122     S [name] is a 26 y.o. G2P1002 patient who presents to MAU today for repeat Quant hCG. Patient reports one episode of abdominal pain yesterday which resolved with Tylenol. She denies complaints on arrival to MAU.  O BP 140/81 (BP Location: Right Arm)   Pulse 92   Temp 99 F (37.2 C) (Oral)   Resp 16   LMP 11/11/2021   SpO2 100% Comment: room air   Physical Exam Vitals and nursing note reviewed. Exam conducted with a chaperone present.  Constitutional:      General: She is not in acute distress.    Appearance: Normal appearance. She is not ill-appearing.  Cardiovascular:     Rate and Rhythm: Normal rate.     Pulses: Normal pulses.  Pulmonary:     Effort: Pulmonary effort is normal.  Neurological:     Mental Status: She is alert.    A Medical screening exam complete No complaints or concerns today Appropriate rise in quant hCG Discussed with Dr. Shawnie Pons, who advises repeat ultrasound in 7-10 days  Component     Latest Ref Rng 12/22/2021 (Office) 12/24/2021 12/26/2021  HCG, Beta Chain, Quant, S     <5 mIU/mL 177 611 (H)  1,698 (H)      P Discharge from MAU in stable condition with strict return precautions Order placed for repeat ultrasound at [facility] in 7-10 days Patient may return to MAU as needed   Clayton Bibles, CNM 12/26/2021 2:07 PM

## 2021-12-26 NOTE — MAU Note (Signed)
Autumn Duarte is a 26 y.o. here in MAU reporting: here for follow up hcg, no pain today. Did have pain yesterday but was resolved with tylenol. No bleeding.  Onset of complaint: ongoing  Pain score: 0/10  Vitals:   12/26/21 1121  BP: 140/81  Pulse: 92  Resp: 16  Temp: 99 F (37.2 C)  SpO2: 100%     Lab orders placed from triage: hcg

## 2021-12-28 NOTE — Telephone Encounter (Signed)
Called patient to let her know [place] has been scheduled for 6/13 at 8 am for dating to determine due date. After this appointment, will be scheduled for [facility].   Patient advised to come to appointment with full bladder. Patient voiced understanding.   Informed patient to go to MAU for bleeding like a period or severe abdominal pain for evaluation. Patient voiced understanding.

## 2022-01-04 ENCOUNTER — Encounter (HOSPITAL_COMMUNITY): Payer: Self-pay | Admitting: Obstetrics and Gynecology

## 2022-01-04 ENCOUNTER — Other Ambulatory Visit: Payer: Self-pay

## 2022-01-04 ENCOUNTER — Inpatient Hospital Stay (HOSPITAL_COMMUNITY)
Admission: AD | Admit: 2022-01-04 | Discharge: 2022-01-05 | Disposition: A | Discharge status: Home / Self Care | Payer: Medicaid Other | Attending: Obstetrics and Gynecology | Admitting: Obstetrics and Gynecology

## 2022-01-04 DIAGNOSIS — K5909 Other constipation: Secondary | ICD-10-CM | POA: Insufficient documentation

## 2022-01-04 DIAGNOSIS — O26891 Other specified pregnancy related conditions, first trimester: Secondary | ICD-10-CM | POA: Insufficient documentation

## 2022-01-04 DIAGNOSIS — O99611 Diseases of the digestive system complicating pregnancy, first trimester: Secondary | ICD-10-CM

## 2022-01-04 DIAGNOSIS — Z3A01 Less than 8 weeks gestation of pregnancy: Secondary | ICD-10-CM | POA: Insufficient documentation

## 2022-01-04 DIAGNOSIS — O30041 Twin pregnancy, dichorionic/diamniotic, first trimester: Secondary | ICD-10-CM | POA: Insufficient documentation

## 2022-01-04 LAB — URINALYSIS, ROUTINE W REFLEX MICROSCOPIC
Bilirubin Urine: NEGATIVE
Glucose, UA: NEGATIVE mg/dL
Hgb urine dipstick: NEGATIVE
Ketones, ur: 80 mg/dL — AB
Leukocytes,Ua: NEGATIVE
Nitrite: NEGATIVE
Protein, ur: NEGATIVE mg/dL
Specific Gravity, Urine: 1.026 (ref 1.005–1.030)
pH: 6 (ref 5.0–8.0)

## 2022-01-04 NOTE — MAU Note (Signed)
.  Autumn Duarte is a 26 y.o. at [redacted]w[redacted]d here in MAU reporting constipation. Her last BM was 3 days ago. Was sitting on toilet straining some and started having abdominal pain. States her "butt hole" hurts. Has had n/v today 3 times.. Still having mild abdominal pain.  Onset of complaint: tonight. Pain score: 4 Vitals:   01/04/22 2325 01/04/22 2326  BP:  (!) 145/79  Pulse: 98   Resp: 17   Temp: 98.9 F (37.2 C)   SpO2: 98%      FHT:n/a Lab orders placed from triage: u/a already placed

## 2022-01-05 ENCOUNTER — Telehealth: Payer: Self-pay | Admitting: Family Medicine

## 2022-01-05 ENCOUNTER — Inpatient Hospital Stay (HOSPITAL_COMMUNITY): Payer: Medicaid Other

## 2022-01-05 DIAGNOSIS — O99611 Diseases of the digestive system complicating pregnancy, first trimester: Secondary | ICD-10-CM

## 2022-01-05 DIAGNOSIS — Z3A01 Less than 8 weeks gestation of pregnancy: Secondary | ICD-10-CM

## 2022-01-05 DIAGNOSIS — K5909 Other constipation: Secondary | ICD-10-CM | POA: Diagnosis not present

## 2022-01-05 DIAGNOSIS — K5900 Constipation, unspecified: Secondary | ICD-10-CM

## 2022-01-05 DIAGNOSIS — O30041 Twin pregnancy, dichorionic/diamniotic, first trimester: Secondary | ICD-10-CM | POA: Diagnosis not present

## 2022-01-05 DIAGNOSIS — O26891 Other specified pregnancy related conditions, first trimester: Secondary | ICD-10-CM | POA: Diagnosis present

## 2022-01-05 NOTE — MAU Note (Signed)
Pt reported good results from 150cc of enema and she feels better and is ready to go home. Laury Deep CNM aware

## 2022-01-05 NOTE — MAU Provider Note (Addendum)
History     CSN: [id number]  Arrival date and time: 01/04/22 2313   Event Date/Time   First Provider Initiated Contact with Patient 01/05/22 0014      Chief Complaint  Patient presents with   Abdominal Pain   constipated   HPI  Autumn Duarte is a 26 y.o. G2P1002 at [redacted]w[redacted]d who presents for evaluation of constipation. Patient reports she has not had a bowel movement in 3 days. She reports she was straining and trying today with no relief. She reports this caused some lower abdominal cramping. Patient rates the pain as a 4/10 and has not tried for the pain. She denies any vaginal bleeding and discharge.   OB History     Gravida  2   Para  1   Term  1   Preterm      AB      Living  2      SAB      IAB      Ectopic      Multiple  1   Live Births  2           Past Medical History:  Diagnosis Date   Asthma    last used inhaler 2 months ago   Bronchitis    Eczema    Obesity    Seasonal allergies     Past Surgical History:  Procedure Laterality Date   WISDOM TOOTH EXTRACTION      Family History  Problem Relation Age of Onset   Arthritis Mother    Hepatitis Mother    Thyroid disease Mother    Diabetes Father    Heart disease Father    Hypertension Father    Breast cancer Maternal Aunt    Breast cancer Maternal Grandmother     Social History   Tobacco Use   Smoking status: Former    Types: Cigarettes    Quit date: 03/19/2018    Years since quitting: 3.8   Smokeless tobacco: Never  Vaping Use   Vaping Use: Never used  Substance Use Topics   Alcohol use: Never   Drug use: Never    Allergies:  Allergies  Allergen Reactions   Shellfish Allergy Itching, Shortness Of Breath and Hives    Medications Prior to Admission  Medication Sig Dispense Refill Last Dose   prenatal vitamin w/FE, FA (PRENATAL 1 + 1) 27-1 MG TABS tablet Take 1 tablet by mouth daily at 12 noon. 30 tablet 11 01/04/2022   PROAIR HFA 108 (90 Base) MCG/ACT inhaler Inhale into  the lungs.   01/04/2022   Cholecalciferol 1.25 MG (50000 UT) capsule Take by mouth. (Patient not taking: Reported on 12/20/2021)      methocarbamol (ROBAXIN) 500 MG tablet Take 1 tablet (500 mg total) by mouth 2 (two) times daily. 20 tablet 0    metroNIDAZOLE (FLAGYL) 500 MG tablet Take 1 tablet (500 mg total) by mouth 2 (two) times daily. 14 tablet 0    ondansetron (ZOFRAN) 4 MG tablet Take 1 tablet (4 mg total) by mouth every 6 (six) hours. (Patient not taking: Reported on 12/20/2021) 12 tablet 0    rizatriptan (MAXALT-MLT) 10 MG disintegrating tablet Take by mouth.      sertraline (ZOLOFT) 50 MG tablet Take 1 tablet by mouth daily.       Review of Systems  Constitutional: Negative.  Negative for fatigue and fever.  HENT: Negative.    Respiratory: Negative.  Negative for shortness of breath.  Cardiovascular: Negative.  Negative for chest pain.  Gastrointestinal:  Positive for abdominal pain and constipation. Negative for diarrhea, nausea and vomiting.  Genitourinary: Negative.  Negative for dysuria, vaginal bleeding and vaginal discharge.  Neurological: Negative.  Negative for dizziness and headaches.  Physical Exam   Blood pressure 130/85, pulse 86, temperature 98.9 F (37.2 C), resp. rate 17, height 5\' 4"  (1.626 m), weight (!) 151.5 kg, last menstrual period 11/11/2021, SpO2 98 %.  Patient Vitals for the past 24 hrs:  BP Temp Pulse Resp SpO2 Height Weight  01/04/22 2350 130/85 -- 86 -- 98 % -- --  01/04/22 2326 (!) 145/79 -- -- -- -- -- --  01/04/22 2325 -- 98.9 F (37.2 C) 98 17 98 % 5\' 4"  (1.626 m) (!) 151.5 kg    Physical Exam Vitals and nursing note reviewed.  Constitutional:      General: She is not in acute distress.    Appearance: She is well-developed.  HENT:     Head: Normocephalic.  Eyes:     Pupils: Pupils are equal, round, and reactive to light.  Cardiovascular:     Rate and Rhythm: Normal rate and regular rhythm.     Heart sounds: Normal heart sounds.   Pulmonary:     Effort: Pulmonary effort is normal. No respiratory distress.     Breath sounds: Normal breath sounds.  Abdominal:     General: Bowel sounds are normal. There is no distension.     Palpations: Abdomen is soft.     Tenderness: There is no abdominal tenderness.  Skin:    General: Skin is warm and dry.  Neurological:     Mental Status: She is alert and oriented to person, place, and time.  Psychiatric:        Mood and Affect: Mood normal.        Behavior: Behavior normal.        Thought Content: Thought content normal.        Judgment: Judgment normal.    MAU Course  Procedures  Results for orders placed or performed during the hospital encounter of 01/04/22 (from the past 24 hour(s))  Urinalysis, Routine w reflex microscopic Urine, Clean Catch     Status: Abnormal   Collection Time: 01/04/22 11:24 PM  Result Value Ref Range   Color, Urine YELLOW YELLOW   APPearance HAZY (A) CLEAR   Specific Gravity, Urine 1.026 1.005 - 1.030   pH 6.0 5.0 - 8.0   Glucose, UA NEGATIVE NEGATIVE mg/dL   Hgb urine dipstick NEGATIVE NEGATIVE   Bilirubin Urine NEGATIVE NEGATIVE   Ketones, ur 80 (A) NEGATIVE mg/dL   Protein, ur NEGATIVE NEGATIVE mg/dL   Nitrite NEGATIVE NEGATIVE   Leukocytes,Ua NEGATIVE NEGATIVE     US OB Transvaginal  Result Date: 01/05/2022 CLINICAL DATA:  Mid abdominal pain. EXAM: TWIN OBSTETRICAL ULTRASOUND <14 WKS TECHNIQUE: Transabdominal ultrasound was performed for evaluation of the gestation as well as the maternal uterus and adnexal regions. COMPARISON:  None Available. FINDINGS: Number of IUPs:  2 Chorionicity/Amnionicity:  Dichorionic-diamniotic (thick membrane) TWIN 1 Yolk sac:  Visualized. Embryo:  Visualized. Cardiac Activity: Visualized. Heart Rate: 114 bpm CRL:   4.0 mm   6 w 1 d                  US EDC: August 30, 2022 TWIN 2 Yolk sac:  Visualized. Embryo:  Visualized. Cardiac Activity: Visualized. Heart Rate: 98 bpm CRL:   2.9 mm   5 w 6  d                   [place] EDC: September 01, 2022 Subchorionic hemorrhage:  None visualized. Maternal uterus/adnexae: The bilateral ovaries are visualized and are normal in appearance. A trace amount of pelvic free fluid is seen. IMPRESSION: Viable twin intrauterine pregnancy at approximately 6 weeks and 1 day gestation for Twin A and 5 weeks and 6 days gestation for Twin B. Electronically Signed   By: Aram Candela M.D.   On: 01/05/2022 01:12     MDM Labs ordered and reviewed.   UA Soap Suds enema  Patient had been followed for pregnancy of unknown location with appropriate rise in HCG. Given worsening abdominal pain, will repeat ultrasound today.  Care turned over to R. Arita Miss CNM at 0100.  Rolm Bookbinder, CNM 01/05/22  Reassessment @ 0130: Start of enema. Patient feeling urge to go to [facility]. CNM will come back to discuss U/S results.  Reassessment @ 0145: Patient not been to [facility]. Discussed results of Di-Di twin gestation with EDC of 09/01/2022. Patient and FOB surprised and pleased with results. Continue with soap suds enema.  Reassessment @ 0230: Feels relief and ready to go home now. Assessment and Plan  Constipation during pregnancy in first trimester  - Advised to eat higher fiber content foods, drink plenty of water everyday and get more active  Dichorionic diamniotic twin pregnancy in first trimester  - Start [facility] by 10 weeks - Desires to return to [facility] for [facility]  [redacted] weeks gestation of pregnancy   - Discharge patient - Message sent to [facility] Admin Pool to schedule [facility] visits for patient - Patient verbalized an understanding of the plan of care and agrees.   Raelyn Mora, CNM  01/05/2022 2:56 AM

## 2022-01-05 NOTE — MAU Note (Signed)
Pt reported pretty good results from enema and rectal pressure is much better now. Wanted to try alittle more enema and took about 150cc before asking RN to stop.

## 2022-01-05 NOTE — MAU Note (Signed)
Explained enema to patient and pt voiced understanding and agreement to take it. Pt tol about 200cc initially before asking RN to stop. Pt will hold fld as long as possible before going to [facility].

## 2022-01-05 NOTE — Progress Notes (Addendum)
WRitten and verbal d/c instructions given and understanding voiced. Discussed increasing raw fruits and veggies, whole grain breads/cereals and flds to help with constipation. May also try Miralax and or fiber as needed

## 2022-01-05 NOTE — Telephone Encounter (Signed)
Called patient to schedule new ob appointments, there was no answer to the phone call so a voicemail was left with the call back number for the office and a letter was mailed.   

## 2022-01-06 ENCOUNTER — Encounter: Payer: Self-pay | Admitting: *Deleted

## 2022-01-08 ENCOUNTER — Emergency Department (HOSPITAL_BASED_OUTPATIENT_CLINIC_OR_DEPARTMENT_OTHER)
Admission: EM | Admit: 2022-01-08 | Discharge: 2022-01-08 | Disposition: A | Discharge status: Home / Self Care | Payer: Medicaid Other | Attending: Emergency Medicine | Admitting: Emergency Medicine

## 2022-01-08 ENCOUNTER — Other Ambulatory Visit: Payer: Self-pay

## 2022-01-08 ENCOUNTER — Encounter (HOSPITAL_BASED_OUTPATIENT_CLINIC_OR_DEPARTMENT_OTHER): Payer: Self-pay | Admitting: Emergency Medicine

## 2022-01-08 DIAGNOSIS — H5789 Other specified disorders of eye and adnexa: Secondary | ICD-10-CM

## 2022-01-08 DIAGNOSIS — H1031 Unspecified acute conjunctivitis, right eye: Secondary | ICD-10-CM | POA: Diagnosis not present

## 2022-01-08 DIAGNOSIS — H5712 Ocular pain, left eye: Secondary | ICD-10-CM | POA: Insufficient documentation

## 2022-01-08 DIAGNOSIS — H5711 Ocular pain, right eye: Secondary | ICD-10-CM | POA: Diagnosis present

## 2022-01-08 MED ORDER — ERYTHROMYCIN 5 MG/GM OP OINT: TOPICAL_OINTMENT | OPHTHALMIC | 0 refills | Status: DC

## 2022-01-08 NOTE — Discharge Instructions (Signed)
I have given you a prescription here for an antibiotic ointment that you will put on both eyes, 4 times a day for 5 days.  You also may find some relief with warm or cool compresses.  Return if you have change in vision or worsening symptoms.

## 2022-01-08 NOTE — ED Triage Notes (Signed)
Pt reports irritation to both eyes, but worse on RT; woke with eye "crusted"; c/o pain to RT eye

## 2022-01-08 NOTE — ED Provider Notes (Signed)
[facility] EMERGENCY DEPARTMENT Provider Note   CSN: [id number] Arrival date & time: 01/08/22  1213     History  Chief Complaint  Patient presents with   Eye Problem    Autumn Duarte is a 26 y.o. female who presents to the ED for evaluation of bilateral eye pain, worse on the right side that she first noticed yesterday.  Patient states that she woke up and her right eye was sealed shut with mucus and she has irritation of the eye.  She denies trauma or injury.  Her husband told patient that she may have pinkeye and told her to come to the emergency department.  She denies vision changes.  Denies fevers, chills, itching.  No other treatment prior to arrival   Eye Problem      Home Medications Prior to Admission medications   Medication Sig Start Date End Date Taking? Authorizing Provider  prenatal vitamin w/FE, FA (PRENATAL 1 + 1) 27-1 MG TABS tablet Take 1 tablet by mouth daily at 12 noon. 12/23/21  Yes Warden Fillers, MD  PROAIR HFA 108 [phone] Base) MCG/ACT inhaler Inhale into the lungs. 04/01/21  Yes [provider]  sertraline (ZOLOFT) 50 MG tablet Take 1 tablet by mouth daily. 04/30/20  Yes [provider]  Cholecalciferol 1.25 MG (50000 UT) capsule Take by mouth. Patient not taking: Reported on 12/20/2021 11/18/19   [provider]  erythromycin ophthalmic ointment Place a 1/2 inch ribbon of ointment into the lower eyelid. 01/08/22   Janell Quiet, PA-C  ondansetron (ZOFRAN) 4 MG tablet Take 1 tablet (4 mg total) by mouth every 6 (six) hours. Patient not taking: Reported on 12/20/2021 09/01/21   Gwyneth Sprout, MD      Allergies    Shellfish allergy    Review of Systems   Review of Systems  Physical Exam Updated Vital Signs BP 130/83 (BP Location: Right Arm)   Pulse 87   Temp 99.1 F (37.3 C) (Oral)   Resp 20   Ht 5\' 4"  (1.626 m)   Wt (!) 151.5 kg   LMP 11/11/2021   SpO2 97%   BMI 57.33 kg/m  Physical Exam Vitals and nursing  note reviewed.  Constitutional:      General: She is not in acute distress.    Appearance: She is not ill-appearing.  HENT:     Head: Atraumatic.  Eyes:     General:        Right eye: Discharge present.     Extraocular Movements: Extraocular movements intact.     Pupils: Pupils are equal, round, and reactive to light.     Comments: Right-sided scleral injection.  No hordeolum's or chalazions  Cardiovascular:     Rate and Rhythm: Normal rate and regular rhythm.     Pulses: Normal pulses.     Heart sounds: No murmur heard. Pulmonary:     Effort: Pulmonary effort is normal. No respiratory distress.     Breath sounds: Normal breath sounds.  Abdominal:     General: Abdomen is flat. There is no distension.     Palpations: Abdomen is soft.     Tenderness: There is no abdominal tenderness.  Musculoskeletal:        General: Normal range of motion.     Cervical back: Normal range of motion.  Skin:    General: Skin is warm and dry.     Capillary Refill: Capillary refill takes less than 2 seconds.  Neurological:  General: No focal deficit present.     Mental Status: She is alert.  Psychiatric:        Mood and Affect: Mood normal.     ED Results / Procedures / Treatments   Labs (all labs ordered are listed, but only abnormal results are displayed) Labs Reviewed - No data to display  EKG None  Radiology No results found.  Procedures Procedures    Medications Ordered in ED Medications - No data to display  ED Course/ Medical Decision Making/ A&P                           Medical Decision Making Risk Prescription drug management.   26 year old female presents to the ED for evaluation of bilateral eye pain, worse on the right side with discharge.  Differentials include bacterial conjunctivitis, viral conjunctivitis, allergic conjunctivitis, corneal abrasion, allergies.  Vitals without significant abnormality.  On exam, patient has scleral injection, worse on the right  side with thick discharge.  Symptoms consistent with viral conjunctivitis of both eyes that progressed to bacterial conjunctivitis of the right eye.  Will discharge home with prescription of erythromycin ointment to use 4 times daily x5 days.  Recommend compresses.  Return precautions discussed.  Patient expresses understanding is amenable to plan.  Discharged home in good condition Final Clinical Impression(s) / ED Diagnoses Final diagnoses:  Acute bacterial conjunctivitis of right eye  Eye irritation    Rx / DC Orders ED Discharge Orders          Ordered    erythromycin ophthalmic ointment  Status:  Discontinued        01/08/22 1301    erythromycin ophthalmic ointment        01/08/22 1302              Janell Quiet, PA-C 01/08/22 1354    Sloan Leiter, DO 01/10/22 [phone]

## 2022-01-08 NOTE — ED Notes (Signed)
Patients states that her spouse may have pink eye. States that the rt eye hurts worse .

## 2022-01-11 ENCOUNTER — Ambulatory Visit (INDEPENDENT_AMBULATORY_CARE_PROVIDER_SITE_OTHER): Payer: Medicaid Other

## 2022-01-11 VITALS — BP 137/87 | HR 99 | Temp 97.8°F

## 2022-01-11 DIAGNOSIS — Z3A01 Less than 8 weeks gestation of pregnancy: Secondary | ICD-10-CM

## 2022-01-11 DIAGNOSIS — O219 Vomiting of pregnancy, unspecified: Secondary | ICD-10-CM

## 2022-01-11 DIAGNOSIS — O30001 Twin pregnancy, unspecified number of placenta and unspecified number of amniotic sacs, first trimester: Secondary | ICD-10-CM

## 2022-01-11 DIAGNOSIS — O30009 Twin pregnancy, unspecified number of placenta and unspecified number of amniotic sacs, unspecified trimester: Secondary | ICD-10-CM | POA: Insufficient documentation

## 2022-01-11 DIAGNOSIS — O3680X0 Pregnancy with inconclusive fetal viability, not applicable or unspecified: Secondary | ICD-10-CM

## 2022-01-11 DIAGNOSIS — O10919 Unspecified pre-existing hypertension complicating pregnancy, unspecified trimester: Secondary | ICD-10-CM | POA: Insufficient documentation

## 2022-01-11 MED ORDER — PROMETHAZINE HCL 25 MG PO TABS: 25.0000 mg | ORAL_TABLET | Freq: Four times a day (QID) | ORAL | 1 refills | Status: DC | PRN

## 2022-01-11 MED ORDER — METOCLOPRAMIDE HCL 10 MG PO TABS: 10.0000 mg | ORAL_TABLET | Freq: Three times a day (TID) | ORAL | 2 refills | Status: DC | PRN

## 2022-01-11 MED ORDER — DOCUSATE SODIUM 100 MG PO CAPS: 100.0000 mg | ORAL_CAPSULE | Freq: Two times a day (BID) | ORAL | 2 refills | Status: DC

## 2022-01-11 NOTE — Progress Notes (Signed)
   GYNECOLOGY OFFICE VISIT NOTE  History:   Autumn Duarte is a 26 y.o. G2P1002 here today for ultrasound results. Patient reports she is still struggling with nausea but no vomiting. Does not want zofran due to difficulty with constipation  Ultrasound shows viable twin IUP- baby A measures [redacted]w[redacted]d with FHR 133 bpm and baby B measures [redacted]w[redacted]d with FHR 129bpm.  Abdominal pain: No   Vaginal bleeding: No    OB History  Gravida Para Term Preterm AB Living  2 1 1     2   SAB IAB Ectopic Multiple Live Births        1 2    # Outcome Date GA Lbr Len/2nd Weight Sex Delivery Anes PTL Lv  2 Current           1A Term 12/04/18 [redacted]w[redacted]d 11:32 / 01:20 6 lb 1.5 oz (2.764 kg) F Vag-Spont EPI  LIV     Birth Comments: WDL  1B Term 12/04/18 [redacted]w[redacted]d 11:32 / 01:40 5 lb 10.1 oz (2.554 kg) F Vag-Spont EPI  LIV     Birth Comments: WDL     Health Maintenance Due  Topic Date Due   COVID-19 Vaccine (2 - Pfizer series) 07/16/2020    Past Medical History:  Diagnosis Date   Asthma    last used inhaler 2 months ago   Bronchitis    Eczema    Obesity    Seasonal allergies     Past Surgical History:  Procedure Laterality Date   WISDOM TOOTH EXTRACTION      The following portions of the patient's history were reviewed and updated as appropriate: allergies, current medications, past family history, past medical history, past social history, past surgical history and problem list.   Review of Systems:  Pertinent items noted in HPI and remainder of comprehensive ROS otherwise negative.  Physical Exam:  BP 137/87   Pulse 99   Temp 97.8 F (36.6 C)   LMP 11/11/2021  CONSTITUTIONAL: Well-developed, well-nourished female in no acute distress.  HEENT:  Normocephalic, atraumatic. External right and left ear normal. No scleral icterus.  NECK: Normal range of motion, supple, no masses noted on observation SKIN: No rash noted. Not diaphoretic. No erythema. No pallor. MUSCULOSKELETAL: Normal range of motion. No edema  noted. NEUROLOGIC: Alert and oriented to person, place, and time. Normal muscle tone coordination.  PSYCHIATRIC: Normal mood and affect. Normal behavior. Normal judgment and thought content. RESPIRATORY: Effort normal, no problems with respiration noted  Assessment and Plan:  Viable Pregnancy - Z34.90  Reviewed results with patient that show a viable intrauterine pregnancy at 6 weeks of gestation. Recommended she contact providers to start prenatal care, and she was given a list of options in her AVS. Prescription sent for prenatal vitamins. Reviewed ED precautions including vaginal bleeding like a period or heavier, severe abdominal pain, and fever. All questions answered.   Please refer to After Visit Summary for other counseling recommendations.   Patient to follow up tomorrow for 6/20 for new OB intake  Total face-to-face time with patient: 25 minutes.  Over 50% of encounter was spent on counseling and coordination of care.   Wende Mott, [place] for [employer], [place]

## 2022-01-18 ENCOUNTER — Telehealth (INDEPENDENT_AMBULATORY_CARE_PROVIDER_SITE_OTHER): Payer: Medicaid Other

## 2022-01-18 DIAGNOSIS — Z3480 Encounter for supervision of other normal pregnancy, unspecified trimester: Secondary | ICD-10-CM

## 2022-01-18 DIAGNOSIS — Z3A00 Weeks of gestation of pregnancy not specified: Secondary | ICD-10-CM

## 2022-01-18 DIAGNOSIS — O0990 Supervision of high risk pregnancy, unspecified, unspecified trimester: Secondary | ICD-10-CM | POA: Insufficient documentation

## 2022-01-18 DIAGNOSIS — O30001 Twin pregnancy, unspecified number of placenta and unspecified number of amniotic sacs, first trimester: Secondary | ICD-10-CM

## 2022-01-18 MED ORDER — BLOOD PRESSURE MONITORING DEVI: 1.0000 | 0 refills | Status: DC

## 2022-01-18 NOTE — Patient Instructions (Signed)

## 2022-01-18 NOTE — Progress Notes (Signed)
New OB Intake  I connected with  Autumn Duarte on 01/18/22 at  2:15 PM EDT by MyChart Video Visit and verified that I am speaking with the correct person using two identifiers. Nurse is located at [facility] and pt is located at [employer].  I discussed the limitations, risks, security and privacy concerns of performing an evaluation and management service by telephone and the availability of in person appointments. I also discussed with the patient that there may be a patient responsible charge related to this service. The patient expressed understanding and agreed to proceed.  I explained I am completing New OB Intake today. We discussed her EDD of 08/18/22 that is based on LMP of 11/11/21. Pt is G2/P1. I reviewed her allergies, medications, Medical/Surgical/OB history, and appropriate screenings. I informed her of [facility] services. Based on history, this is a/an  pregnancy uncomplicated .   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   MyChart/Babyscripts MyChart access verified. I explained pt will have some visits in office and some virtually. Babyscripts instructions given and order placed. Patient verifies receipt of registration text/e-mail. Account successfully created and app downloaded.  Blood Pressure Cuff  Blood pressure cuff ordered for patient to pick-up from [employer]. Explained after first prenatal appt pt will check weekly and document in Babyscripts.  Weight scale: Patient does / does not  have weight scale. Weight scale ordered for patient to pick up from [employer].   Anatomy US Explained first  scheduled [place] will be around 19 weeks. Anatomy US scheduled for 03/28/22 at 0945. Pt notified to arrive at 0930. Scheduled AFP lab only appointment if CenteringPregnancy pt for same day as anatomy US.   Labs Discussed Avelina Laine genetic screening with patient. Would like both Panorama and Horizon drawn at new OB visit.Also if interested in genetic testing, tell patient she will need AFP 15-21 weeks to complete genetic testing .Routine prenatal labs needed.  Covid Vaccine Patient has covid vaccine.   Is patient a CenteringPregnancy candidate?  Accepted Declined due to NA Not a candidate due to NA Centering Patient" indicated on sticky note   Is patient a Mom+Baby Combined Care candidate?  Not a candidate    Scheduled with Mom+Baby provider    Is patient interested in Waterbirth?  No   "Interested in [employer] - Schedule next visit with CNM" on sticky note  Informed patient of [facility] website  and placed link in her AVS.   Social Determinants of Health Food Insecurity: Patient denies food insecurity. WIC Referral: Patient is interested in referral to [facility].  Transportation: Patient denies transportation needs. Childcare: Discussed no children allowed at ultrasound appointments. Offered childcare services; patient declines childcare services at this time.  Send link to Pregnancy Navigators   Placed OB Box on problem list and updated  First visit review I reviewed new OB appt with pt. I explained she will have a pelvic exam, ob bloodwork with genetic screening, and PAP smear. Explained pt will be seen by Dr.Pickens at first visit; encounter routed to appropriate provider. Explained that patient will be seen by pregnancy  navigator following visit with provider. [facility] information placed in AVS.   Henrietta Dine, CMA 01/18/2022  2:30 PM

## 2022-01-19 ENCOUNTER — Encounter: Payer: Self-pay | Admitting: *Deleted

## 2022-01-23 ENCOUNTER — Encounter (HOSPITAL_COMMUNITY): Payer: Self-pay | Admitting: Obstetrics & Gynecology

## 2022-01-23 ENCOUNTER — Other Ambulatory Visit: Payer: Self-pay

## 2022-01-23 ENCOUNTER — Inpatient Hospital Stay (HOSPITAL_COMMUNITY)
Admission: AD | Admit: 2022-01-23 | Discharge: 2022-01-23 | Disposition: A | Discharge status: Home / Self Care | Payer: Medicaid Other | Attending: Obstetrics & Gynecology | Admitting: Obstetrics & Gynecology

## 2022-01-23 DIAGNOSIS — Z3A10 10 weeks gestation of pregnancy: Secondary | ICD-10-CM | POA: Insufficient documentation

## 2022-01-23 DIAGNOSIS — O30041 Twin pregnancy, dichorionic/diamniotic, first trimester: Secondary | ICD-10-CM | POA: Insufficient documentation

## 2022-01-23 DIAGNOSIS — J029 Acute pharyngitis, unspecified: Secondary | ICD-10-CM | POA: Insufficient documentation

## 2022-01-23 DIAGNOSIS — R102 Pelvic and perineal pain: Secondary | ICD-10-CM

## 2022-01-23 DIAGNOSIS — O26891 Other specified pregnancy related conditions, first trimester: Secondary | ICD-10-CM

## 2022-01-23 DIAGNOSIS — O219 Vomiting of pregnancy, unspecified: Secondary | ICD-10-CM

## 2022-01-23 DIAGNOSIS — J069 Acute upper respiratory infection, unspecified: Secondary | ICD-10-CM | POA: Diagnosis not present

## 2022-01-23 DIAGNOSIS — M25559 Pain in unspecified hip: Secondary | ICD-10-CM | POA: Diagnosis not present

## 2022-01-23 DIAGNOSIS — Z20822 Contact with and (suspected) exposure to covid-19: Secondary | ICD-10-CM | POA: Diagnosis not present

## 2022-01-23 LAB — URINALYSIS, ROUTINE W REFLEX MICROSCOPIC
Bilirubin Urine: NEGATIVE
Glucose, UA: NEGATIVE mg/dL
Hgb urine dipstick: NEGATIVE
Ketones, ur: NEGATIVE mg/dL
Nitrite: NEGATIVE
Protein, ur: NEGATIVE mg/dL
Specific Gravity, Urine: 1.017 (ref 1.005–1.030)
pH: 7 (ref 5.0–8.0)

## 2022-01-23 LAB — GROUP A STREP BY PCR: Group A Strep by PCR: NOT DETECTED

## 2022-01-23 MED ORDER — ONDANSETRON 8 MG PO TBDP: 8.0000 mg | ORAL_TABLET | Freq: Three times a day (TID) | ORAL | 2 refills | Status: DC | PRN

## 2022-01-23 MED ORDER — ACETAMINOPHEN 500 MG PO TABS
1000.0000 mg | ORAL_TABLET | Freq: Once | ORAL | Status: AC
  Administered 2022-01-23: 1000 mg via ORAL
  Filled 2022-01-23: qty 2

## 2022-01-23 MED ORDER — CYCLOBENZAPRINE HCL 10 MG PO TABS: 10.0000 mg | ORAL_TABLET | Freq: Two times a day (BID) | ORAL | 0 refills | Status: DC | PRN

## 2022-01-23 MED ORDER — PROCHLORPERAZINE MALEATE 10 MG PO TABS: 10.0000 mg | ORAL_TABLET | Freq: Two times a day (BID) | ORAL | 1 refills | Status: DC | PRN

## 2022-01-23 MED ORDER — DOCUSATE SODIUM 100 MG PO CAPS
100.0000 mg | ORAL_CAPSULE | Freq: Two times a day (BID) | ORAL | 2 refills | Status: DC
Start: 2022-01-23 — End: 2022-11-22

## 2022-01-24 LAB — CULTURE, OB URINE: Culture: NO GROWTH

## 2022-01-24 LAB — RESPIRATORY PANEL BY PCR

## 2022-01-31 ENCOUNTER — Inpatient Hospital Stay (HOSPITAL_COMMUNITY)
Admission: AD | Admit: 2022-01-31 | Discharge: 2022-01-31 | Disposition: A | Discharge status: Home / Self Care | Payer: Medicaid Other | Attending: Obstetrics and Gynecology | Admitting: Obstetrics and Gynecology

## 2022-01-31 DIAGNOSIS — Z3480 Encounter for supervision of other normal pregnancy, unspecified trimester: Secondary | ICD-10-CM

## 2022-01-31 DIAGNOSIS — Z3A11 11 weeks gestation of pregnancy: Secondary | ICD-10-CM

## 2022-01-31 DIAGNOSIS — O99891 Other specified diseases and conditions complicating pregnancy: Secondary | ICD-10-CM

## 2022-01-31 DIAGNOSIS — O26891 Other specified pregnancy related conditions, first trimester: Secondary | ICD-10-CM | POA: Insufficient documentation

## 2022-01-31 DIAGNOSIS — R109 Unspecified abdominal pain: Secondary | ICD-10-CM | POA: Insufficient documentation

## 2022-01-31 DIAGNOSIS — M549 Dorsalgia, unspecified: Secondary | ICD-10-CM | POA: Diagnosis not present

## 2022-01-31 DIAGNOSIS — O30041 Twin pregnancy, dichorionic/diamniotic, first trimester: Secondary | ICD-10-CM | POA: Diagnosis not present

## 2022-01-31 DIAGNOSIS — O30001 Twin pregnancy, unspecified number of placenta and unspecified number of amniotic sacs, first trimester: Secondary | ICD-10-CM

## 2022-01-31 LAB — URINALYSIS, ROUTINE W REFLEX MICROSCOPIC
Bacteria, UA: NONE SEEN
Bilirubin Urine: NEGATIVE
Glucose, UA: NEGATIVE mg/dL
Hgb urine dipstick: NEGATIVE
Ketones, ur: NEGATIVE mg/dL
Nitrite: NEGATIVE
Protein, ur: 100 mg/dL — AB
Specific Gravity, Urine: 1.026 (ref 1.005–1.030)
pH: 6 (ref 5.0–8.0)

## 2022-01-31 MED ORDER — CYCLOBENZAPRINE HCL 5 MG PO TABS
10.0000 mg | ORAL_TABLET | Freq: Once | ORAL | Status: AC
Start: 2022-01-31 — End: 2022-01-31
  Administered 2022-01-31: 10 mg via ORAL
  Filled 2022-01-31: qty 2

## 2022-01-31 NOTE — MAU Provider Note (Signed)
History     CSN: [id number]  Arrival date and time: 01/31/22 1941   Event Date/Time   First Provider Initiated Contact with Patient 01/31/22 2100      Chief Complaint  Patient presents with  . Back Pain  . Abdominal Pain   Autumn Duarte is a 26 y.o. G2P1002 at [redacted]w[redacted]d who receives care at [facility].  She presents today for Back Pain and Abdominal Pain. She reports the pain started after an argument that  occurred around 6pm.  She states she took tylenol and the pain improved.  She reports the back pain is "now an achy pain, but was initially was pressure from both sides."  She states the pain is located in the middle of her back.  She reports the abdominal pain started at the same time and she describes it as cramping.  She states that the cramping has since resolved.    OB History     Gravida  2   Para  1   Term  1   Preterm      AB      Living  2      SAB      IAB      Ectopic      Multiple  1   Live Births  2           Past Medical History:  Diagnosis Date  . Asthma    last used inhaler 2 months ago  . Bronchitis   . Eczema   . Obesity   . Seasonal allergies     Past Surgical History:  Procedure Laterality Date  . WISDOM TOOTH EXTRACTION      Family History  Problem Relation Age of Onset  . Arthritis Mother   . Hepatitis Mother   . Thyroid disease Mother   . Diabetes Father   . Heart disease Father   . Hypertension Father   . Breast cancer Maternal Aunt   . Breast cancer Maternal Grandmother     Social History   Tobacco Use  . Smoking status: Former    Types: Cigarettes    Quit date: 03/19/2018    Years since quitting: 3.8  . Smokeless tobacco: Never  Vaping Use  . Vaping Use: Never used  Substance Use Topics  . Alcohol use: Never  . Drug use: Never    Allergies:  Allergies  Allergen Reactions  . Shellfish Allergy Itching, Shortness Of Breath and Hives    Medications Prior to Admission  Medication Sig Dispense Refill Last  Dose  . cyclobenzaprine (FLEXERIL) 10 MG tablet Take 1 tablet (10 mg total) by mouth 2 (two) times daily as needed for muscle spasms. 20 tablet 0 01/30/2022  . docusate sodium (COLACE) 100 MG capsule Take 1 capsule (100 mg total) by mouth 2 (two) times daily. 30 capsule 2 Past Week  . prenatal vitamin w/FE, FA (PRENATAL 1 + 1) 27-1 MG TABS tablet Take 1 tablet by mouth daily at 12 noon. 30 tablet 11 01/31/2022  . PROAIR HFA 108 (90 Base) MCG/ACT inhaler Inhale into the lungs.   Past Month  . promethazine (PHENERGAN) 25 MG tablet Take 1 tablet (25 mg total) by mouth every 6 (six) hours as needed for nausea or vomiting. 30 tablet 1 01/30/2022  . Blood Pressure Monitoring DEVI 1 each by Does not apply route once a week. 1 each 0   . Cholecalciferol 1.25 MG (50000 UT) capsule Take by mouth. (Patient not taking: Reported  on 12/20/2021)     . erythromycin ophthalmic ointment Place a 1/2 inch ribbon of ointment into the lower eyelid. 3.5 g 0   . ondansetron (ZOFRAN-ODT) 8 MG disintegrating tablet Take 1 tablet (8 mg total) by mouth every 8 (eight) hours as needed for nausea or vomiting. 30 tablet 2   . prochlorperazine (COMPAZINE) 10 MG tablet Take 1 tablet (10 mg total) by mouth 2 (two) times daily as needed for nausea or vomiting. 30 tablet 1     Review of Systems  Gastrointestinal:  Positive for abdominal pain (None currently) and nausea. Negative for vomiting.  Genitourinary:  Negative for difficulty urinating, dysuria, vaginal bleeding and vaginal discharge.  Musculoskeletal:  Positive for back pain.  Neurological:  Negative for dizziness, light-headedness and headaches.   Physical Exam   Blood pressure 138/84, pulse (!) 123, temperature 99.3 F (37.4 C), temperature source Oral, resp. rate 18, height 5\' 4"  (1.626 m), weight (!) 148.8 kg, last menstrual period 11/11/2021, SpO2 97 %.  Physical Exam Vitals reviewed.  Constitutional:      General: She is not in acute distress.    Appearance: Normal  appearance. She is well-developed. She is obese. She is not ill-appearing.  HENT:     Head: Normocephalic and atraumatic.  Eyes:     Conjunctiva/sclera: Conjunctivae normal.  Cardiovascular:     Heart sounds: Normal heart sounds.  Pulmonary:     Effort: Pulmonary effort is normal. No respiratory distress.  Abdominal:     Palpations: Abdomen is soft.     Tenderness: There is no abdominal tenderness.  Musculoskeletal:        General: Normal range of motion.     Lumbar back: Tenderness present.       Back:  Skin:    General: Skin is warm and dry.  Neurological:     Mental Status: She is alert and oriented to person, place, and time.  Psychiatric:        Mood and Affect: Mood normal.        Behavior: Behavior normal.    MAU Course  Procedures Results for orders placed or performed during the hospital encounter of 01/31/22 (from the past 24 hour(s))  Urinalysis, Routine w reflex microscopic Urine, Clean Catch     Status: Abnormal   Collection Time: 01/31/22  8:27 PM  Result Value Ref Range   Color, Urine AMBER (A) YELLOW   APPearance HAZY (A) CLEAR   Specific Gravity, Urine 1.026 1.005 - 1.030   pH 6.0 5.0 - 8.0   Glucose, UA NEGATIVE NEGATIVE mg/dL   Hgb urine dipstick NEGATIVE NEGATIVE   Bilirubin Urine NEGATIVE NEGATIVE   Ketones, ur NEGATIVE NEGATIVE mg/dL   Protein, ur 04/03/22 (A) NEGATIVE mg/dL   Nitrite NEGATIVE NEGATIVE   Leukocytes,Ua TRACE (A) NEGATIVE   RBC / HPF 6-10 0 - 5 RBC/hpf   WBC, UA 11-20 0 - 5 WBC/hpf   Bacteria, UA NONE SEEN NONE SEEN   Squamous Epithelial / LPF 6-10 0 - 5   Mucus PRESENT    Patient informed that the ultrasound is considered a limited OB ultrasound and is not intended to be a complete ultrasound exam.  Patient also informed that the ultrasound is not being completed with the intent of assessing for fetal or placental anomalies or any pelvic abnormalities.  Explained that the purpose of today's ultrasound is to assess for  viability/FHR   Patient acknowledges the purpose of the exam and the limitations of the study.  Di/Di twin gestation noted with good fetal movement Twin A FHR 174 Twin B FHR 164       MDM BSUS Exam Muscle Relaxant Heat Assessment and Plan  26 year old, G2P1002  SIUP at 11.4 weeks Abdominal Pain-Resolved Back Pain  -Reviewed POC with patient. -Exam performed and findings discussed.  -Reviewed usage of muscle relaxant and heating pad on area. -Patient agreeable.  -BSUS confirms TIUP with m/m and FHR noted.  -Monitor and reassess  Cherre Robins 01/31/2022, 9:00 PM   Reassessment (9:49 PM)  -Patient reports pain has improved with interventions. -Discussed usage of heating pad at home.  Instructed to heat up washcloth in microwave and then wrap in dry washcloth before applying to affected area.  -Patient offered and declines pain medication. -Instructed to keep next appt as scheduled. -Encouraged to call primary office or return to MAU if symptoms worsen or with the onset of new symptoms. -Discharged to home in stable condition.  Cherre Robins MSN, CNM Advanced Practice Provider, Center for [employer]

## 2022-01-31 NOTE — MAU Note (Signed)
Autumn Duarte is a 26 y.o. at [redacted]w[redacted]d here in MAU reporting: lower abdominal and back pain after having an argument with her boyfriend. Took tylenol. Pain went from 10 to a 5. No bleeding or LOF.  LMP:  Onset of complaint: one hour ago Pain score: 5/10 Vitals:   01/31/22 2015  BP: 138/84  Pulse: (!) 123  Resp: 18  Temp: 99.3 F (37.4 C)  SpO2: 97%     SWN:IOEVOJ in triage Lab orders placed from triage: UA

## 2022-02-11 ENCOUNTER — Other Ambulatory Visit: Payer: Self-pay

## 2022-02-11 ENCOUNTER — Ambulatory Visit (INDEPENDENT_AMBULATORY_CARE_PROVIDER_SITE_OTHER): Payer: Medicaid Other | Admitting: Obstetrics and Gynecology

## 2022-02-11 ENCOUNTER — Encounter: Payer: Self-pay | Admitting: Family Medicine

## 2022-02-11 ENCOUNTER — Other Ambulatory Visit (HOSPITAL_COMMUNITY)
Admission: RE | Admit: 2022-02-11 | Discharge: 2022-02-11 | Disposition: A | Discharge status: Home / Self Care | Payer: Medicaid Other | Source: Ambulatory Visit | Attending: Obstetrics and Gynecology | Admitting: Obstetrics and Gynecology

## 2022-02-11 ENCOUNTER — Encounter: Payer: Self-pay | Admitting: Obstetrics and Gynecology

## 2022-02-11 VITALS — BP 111/94 | HR 96 | Wt 326.1 lb

## 2022-02-11 DIAGNOSIS — Z3481 Encounter for supervision of other normal pregnancy, first trimester: Secondary | ICD-10-CM | POA: Diagnosis not present

## 2022-02-11 DIAGNOSIS — O131 Gestational [pregnancy-induced] hypertension without significant proteinuria, first trimester: Secondary | ICD-10-CM

## 2022-02-11 DIAGNOSIS — Z3482 Encounter for supervision of other normal pregnancy, second trimester: Secondary | ICD-10-CM

## 2022-02-11 DIAGNOSIS — O30041 Twin pregnancy, dichorionic/diamniotic, first trimester: Secondary | ICD-10-CM

## 2022-02-11 DIAGNOSIS — Z3A11 11 weeks gestation of pregnancy: Secondary | ICD-10-CM

## 2022-02-11 DIAGNOSIS — Z3480 Encounter for supervision of other normal pregnancy, unspecified trimester: Secondary | ICD-10-CM

## 2022-02-11 DIAGNOSIS — O0990 Supervision of high risk pregnancy, unspecified, unspecified trimester: Secondary | ICD-10-CM | POA: Diagnosis present

## 2022-02-11 DIAGNOSIS — Z6841 Body Mass Index (BMI) 40.0 and over, adult: Secondary | ICD-10-CM

## 2022-02-11 DIAGNOSIS — O10919 Unspecified pre-existing hypertension complicating pregnancy, unspecified trimester: Secondary | ICD-10-CM

## 2022-02-11 DIAGNOSIS — Z31430 Encounter of female for testing for genetic disease carrier status for procreative management: Secondary | ICD-10-CM

## 2022-02-11 LAB — POCT URINALYSIS DIP (DEVICE)
Glucose, UA: NEGATIVE mg/dL
Nitrite: NEGATIVE
Protein, ur: 30 mg/dL — AB
Specific Gravity, Urine: 1.025 (ref 1.005–1.030)
Urobilinogen, UA: 1 mg/dL (ref 0.0–1.0)
pH: 7 (ref 5.0–8.0)

## 2022-02-11 MED ORDER — ASPIRIN 81 MG PO TBEC: 162.0000 mg | DELAYED_RELEASE_TABLET | Freq: Every day | ORAL | 2 refills | Status: DC

## 2022-02-11 NOTE — Progress Notes (Signed)
Home Medicaid Form completed  Addison Naegeli, [place]  02/11/22

## 2022-02-12 ENCOUNTER — Encounter: Payer: Self-pay | Admitting: Obstetrics and Gynecology

## 2022-02-12 LAB — HEMOGLOBIN A1C
Est. average glucose Bld gHb Est-mCnc: 108 mg/dL
Hgb A1c MFr Bld: 5.4 % (ref 4.8–5.6)

## 2022-02-12 LAB — COMPREHENSIVE METABOLIC PANEL
ALT: 16 IU/L (ref 0–32)
AST: 11 IU/L (ref 0–40)
Albumin/Globulin Ratio: 1.3 (ref 1.2–2.2)
Albumin: 3.8 g/dL — ABNORMAL LOW (ref 4.0–5.0)
Alkaline Phosphatase: 29 IU/L — ABNORMAL LOW (ref 44–121)
BUN/Creatinine Ratio: 13 (ref 9–23)
BUN: 8 mg/dL (ref 6–20)
Bilirubin Total: 0.3 mg/dL (ref 0.0–1.2)
CO2: 21 mmol/L (ref 20–29)
Calcium: 9.1 mg/dL (ref 8.7–10.2)
Chloride: 103 mmol/L (ref 96–106)
Creatinine, Ser: 0.64 mg/dL (ref 0.57–1.00)
Globulin, Total: 3 g/dL (ref 1.5–4.5)
Glucose: 95 mg/dL (ref 70–99)
Potassium: 4 mmol/L (ref 3.5–5.2)
Sodium: 137 mmol/L (ref 134–144)
Total Protein: 6.8 g/dL (ref 6.0–8.5)
eGFR: 126 mL/min/{1.73_m2} (ref >59)

## 2022-02-12 LAB — CBC/D/PLT+RPR+RH+ABO+RUBIGG...
Antibody Screen: NEGATIVE
Basophils Absolute: 0 10*3/uL (ref 0.0–0.2)
Basos: 0 %
EOS (ABSOLUTE): 0 10*3/uL (ref 0.0–0.4)
Eos: 0 %
HCV Ab: NONREACTIVE
HIV Screen 4th Generation wRfx: NONREACTIVE
Hematocrit: 38.1 % (ref 34.0–46.6)
Hemoglobin: 13.1 g/dL (ref 11.1–15.9)
Hepatitis B Surface Ag: NEGATIVE
Immature Grans (Abs): 0 10*3/uL (ref 0.0–0.1)
Immature Granulocytes: 0 %
Lymphocytes Absolute: 2 10*3/uL (ref 0.7–3.1)
Lymphs: 23 %
MCH: 30.8 pg (ref 26.6–33.0)
MCHC: 34.4 g/dL (ref 31.5–35.7)
MCV: 89 fL (ref 79–97)
Monocytes Absolute: 0.3 10*3/uL (ref 0.1–0.9)
Monocytes: 4 %
Neutrophils Absolute: 6.5 10*3/uL (ref 1.4–7.0)
Neutrophils: 73 %
Platelets: 275 10*3/uL (ref 150–450)
RBC: 4.26 x10E6/uL (ref 3.77–5.28)
RDW: 13 % (ref 11.7–15.4)
RPR Ser Ql: NONREACTIVE
Rh Factor: POSITIVE
Rubella Antibodies, IGG: 1.99 index (ref >0.99)
WBC: 8.9 10*3/uL (ref 3.4–10.8)

## 2022-02-12 LAB — PROTEIN / CREATININE RATIO, URINE
Creatinine, Urine: 288.1 mg/dL
Protein, Ur: 23 mg/dL
Protein/Creat Ratio: 80 mg/g creat (ref 0–200)

## 2022-02-12 LAB — HCV INTERPRETATION

## 2022-02-12 LAB — TSH RFX ON ABNORMAL TO FREE T4: TSH: 1.01 u[IU]/mL (ref 0.450–4.500)

## 2022-02-12 NOTE — Progress Notes (Signed)
New OB Note  02/11/2022   Clinic: Center for [facility]  Chief Complaint: new OB  Transfer of Care Patient: no  History of Present Illness: Ms. Hoye is a 26 y.o. G2P1002 @ 11/3 weeks [facility] Jan 30th, based on 6wk u/s). Patient's last menstrual period was 11/11/2021.  Preg complicated by has ECZEMA; PROBLEMS WITH HEARING; Supervision of high risk pregnancy, antepartum; Dichorionic diamniotic twin gestation; BMI 50.0-59.9, adult ([place]); Moderate major depression ([place]); Prediabetes; Adjustment disorder with mixed anxiety and depressed mood; Chronic hypertension during pregnancy, antepartum; and Supervision of other normal pregnancy, antepartum on their problem list.   No s/s of SAB or morning sickness  ROS: A 12-point review of systems was performed and negative, except as stated in the above HPI.  OBGYN History: As per HPI. OB History  Gravida Para Term Preterm AB Living  2 1 1     2   SAB IAB Ectopic Multiple Live Births        1 2    # Outcome Date GA Lbr Len/2nd Weight Sex Delivery Anes PTL Lv  2 Current           1A Term 12/04/18 [redacted]w[redacted]d 11:32 / 01:20 6 lb 1.5 oz (2.764 kg) F Vag-Spont EPI  LIV     Birth Comments: WDL  1B Term 12/04/18 [redacted]w[redacted]d 11:32 / 01:40 5 lb 10.1 oz (2.554 kg) F Vag-Spont EPI  LIV     Birth Comments: WDL    Any issues with any prior pregnancies: gHTN and di-di twins Prior children are healthy, doing well, and without any problems or issues: yes History of pap smears: Yes. Last pap smear 2023 and results were negative   Past Medical History: Past Medical History:  Diagnosis Date   Asthma    last used inhaler 2 months ago   Bronchitis    Eczema    Obesity    Seasonal allergies     Past Surgical History: Past Surgical History:  Procedure Laterality Date   WISDOM TOOTH EXTRACTION      Family History:  Family History  Problem Relation Age of Onset   Arthritis Mother    Hepatitis Mother    Thyroid disease Mother    Diabetes  Father    Heart disease Father    Hypertension Father    Breast cancer Maternal Aunt    Breast cancer Maternal Grandmother     Social History:  Social History   Socioeconomic History   Marital status: Single    Spouse name: Not on file   Number of children: Not on file   Years of education: Not on file   Highest education level: Not on file  Occupational History   Not on file  Tobacco Use   Smoking status: Former    Types: Cigarettes    Quit date: 03/19/2018    Years since quitting: 3.9   Smokeless tobacco: Never  Vaping Use   Vaping Use: Never used  Substance and Sexual Activity   Alcohol use: Never   Drug use: Never   Sexual activity: Yes    Birth control/protection: None  Other Topics Concern   Not on file  Social History Narrative   Not on file   Social Determinants of Health   Financial Resource Strain: Not on file  Food Insecurity: No Food Insecurity (04/15/2021)   Hunger Vital Sign    Worried About Running Out of Food in the Last Year: Never true    Ran Out of Food  in the Last Year: Never true  Transportation Needs: No Transportation Needs (04/15/2021)   PRAPARE - Administrator, Civil Service (Medical): No    Lack of Transportation (Non-Medical): No  Physical Activity: Not on file  Stress: Not on file  Social Connections: Not on file  Intimate Partner Violence: Not on file   Allergy: Allergies  Allergen Reactions   Shellfish Allergy Itching, Shortness Of Breath and Hives   Current Outpatient Medications: Prenatal vitamin  Physical Exam:   BP (!) 111/94   Pulse 96   Wt (!) 326 lb 1.6 oz (147.9 kg)   LMP 11/11/2021   BMI 55.97 kg/m  Body mass index is 55.97 kg/m. Contractions: Not present Vag. Bleeding: None.  General appearance: Well nourished, well developed female in no acute distress.  Neck:  Supple, normal appearance, and no thyromegaly  Cardiovascular: S1, S2 normal, no murmur, rub or gallop, regular rate and  rhythm Respiratory:  Clear to auscultation bilateral. Normal respiratory effort Abdomen: positive bowel sounds and no masses, hernias; diffusely non tender to palpation, non distended Breasts: no s/s. Neuro/Psych:  Normal mood and affect.  Skin:  Warm and dry.   Laboratory: none  Imaging:  Bedside u/s-with normal FHR x 2, subj normal AF and +FM. Size c/w dates  Narrative & Impression  CLINICAL DATA:  Mid abdominal pain.   EXAM: TWIN OBSTETRICAL ULTRASOUND <14 WKS   TECHNIQUE: Transabdominal ultrasound was performed for evaluation of the gestation as well as the maternal uterus and adnexal regions.   COMPARISON:  None Available.   FINDINGS: Number of IUPs:  2   Chorionicity/Amnionicity:  Dichorionic-diamniotic (thick membrane)   TWIN 1   Yolk sac:  Visualized.   Embryo:  Visualized.   Cardiac Activity: Visualized.   Heart Rate: 114 bpm   CRL:   4.0 mm   6 w 1 d                  [place] EDC: August 30, 2022   TWIN 2   Yolk sac:  Visualized.   Embryo:  Visualized.   Cardiac Activity: Visualized.   Heart Rate: 98 bpm   CRL:   2.9 mm   5 w 6 d                  [place] EDC: September 01, 2022   Subchorionic hemorrhage:  None visualized.   Maternal uterus/adnexae: The bilateral ovaries are visualized and are normal in appearance.   A trace amount of pelvic free fluid is seen.   IMPRESSION: Viable twin intrauterine pregnancy at approximately 6 weeks and 1 day gestation for Twin A and 5 weeks and 6 days gestation for Twin B.     Electronically Signed   By: Aram Candela M.D.   On: 01/05/2022 01:12      Assessment: pt doing well  Plan: 1. Supervision of high risk pregnancy, antepartum Dates adjusted from 13/1 to 11/3 based on her 6wk u/s Recommend she do asa 162mg  which she amenable to next week - Hemoglobin A1c - Protein / creatinine ratio, urine - Culture, OB Urine - Comprehensive metabolic panel - CBC/D/Plt+RPR+Rh+ABO+RubIgG... - TSH Rfx on  Abnormal to Free T4 - GC/Chlamydia probe amp ([place])not at [facility]  2. Encounter of female for testing for genetic disease carrier status for procreative management - HORIZON CUSTOM  3. Encounter for supervision of other normal pregnancy in second trimester - Panorama Prenatal Test Full Panel  4. Transient hypertension of  pregnancy in first trimester Based on her prior BPs, I told her I would call her a CHTN pt for this pregnancy.   5. BMI 50.0-59.9, adult ([facility]) Recommend 15-20lbs weight gain this pregnancy  6. Dichorionic diamniotic twin pregnancy in first trimester Prior twin SVD  7. Chronic hypertension during pregnancy, antepartum 1-2wk RN bp check. D/w her that may need to start meds next visit  Problem list reviewed and updated.  >50% of 35 min visit spent on counseling and coordination of care.     Cornelia Copa MD Attending Center for [facility])

## 2022-02-13 LAB — CULTURE, OB URINE

## 2022-02-13 LAB — URINE CULTURE, OB REFLEX

## 2022-02-14 ENCOUNTER — Encounter: Payer: Self-pay | Admitting: *Deleted

## 2022-02-14 LAB — GC/CHLAMYDIA PROBE AMP (~~LOC~~) NOT AT ARMC
Chlamydia: NEGATIVE
Comment: NEGATIVE
Comment: NORMAL
Neisseria Gonorrhea: NEGATIVE

## 2022-02-18 ENCOUNTER — Ambulatory Visit: Payer: Medicaid Other

## 2022-02-19 ENCOUNTER — Inpatient Hospital Stay (HOSPITAL_COMMUNITY)
Admission: AD | Admit: 2022-02-19 | Discharge: 2022-02-19 | Disposition: A | Discharge status: Home / Self Care | Payer: Medicaid Other | Attending: Obstetrics & Gynecology | Admitting: Obstetrics & Gynecology

## 2022-02-19 ENCOUNTER — Other Ambulatory Visit: Payer: Self-pay

## 2022-02-19 ENCOUNTER — Encounter (HOSPITAL_COMMUNITY): Payer: Self-pay | Admitting: Obstetrics & Gynecology

## 2022-02-19 DIAGNOSIS — Z3A12 12 weeks gestation of pregnancy: Secondary | ICD-10-CM | POA: Diagnosis not present

## 2022-02-19 DIAGNOSIS — O10911 Unspecified pre-existing hypertension complicating pregnancy, first trimester: Secondary | ICD-10-CM | POA: Insufficient documentation

## 2022-02-19 DIAGNOSIS — O30041 Twin pregnancy, dichorionic/diamniotic, first trimester: Secondary | ICD-10-CM | POA: Insufficient documentation

## 2022-02-19 DIAGNOSIS — R1084 Generalized abdominal pain: Secondary | ICD-10-CM | POA: Insufficient documentation

## 2022-02-19 DIAGNOSIS — O0990 Supervision of high risk pregnancy, unspecified, unspecified trimester: Secondary | ICD-10-CM

## 2022-02-19 DIAGNOSIS — O26891 Other specified pregnancy related conditions, first trimester: Secondary | ICD-10-CM | POA: Diagnosis not present

## 2022-02-19 DIAGNOSIS — O99351 Diseases of the nervous system complicating pregnancy, first trimester: Secondary | ICD-10-CM | POA: Insufficient documentation

## 2022-02-19 DIAGNOSIS — G44209 Tension-type headache, unspecified, not intractable: Secondary | ICD-10-CM | POA: Insufficient documentation

## 2022-02-19 DIAGNOSIS — O0991 Supervision of high risk pregnancy, unspecified, first trimester: Secondary | ICD-10-CM | POA: Diagnosis not present

## 2022-02-19 DIAGNOSIS — O10919 Unspecified pre-existing hypertension complicating pregnancy, unspecified trimester: Secondary | ICD-10-CM

## 2022-02-19 DIAGNOSIS — M7918 Myalgia, other site: Secondary | ICD-10-CM | POA: Diagnosis not present

## 2022-02-19 LAB — URINALYSIS, ROUTINE W REFLEX MICROSCOPIC
Bilirubin Urine: NEGATIVE
Glucose, UA: NEGATIVE mg/dL
Hgb urine dipstick: NEGATIVE
Ketones, ur: NEGATIVE mg/dL
Leukocytes,Ua: NEGATIVE
Nitrite: NEGATIVE
Protein, ur: NEGATIVE mg/dL
Specific Gravity, Urine: 1.032 — ABNORMAL HIGH (ref 1.005–1.030)
pH: 6 (ref 5.0–8.0)

## 2022-02-19 MED ORDER — DIPHENHYDRAMINE HCL 25 MG PO CAPS
25.0000 mg | ORAL_CAPSULE | Freq: Once | ORAL | Status: AC
  Administered 2022-02-19: 25 mg via ORAL
  Filled 2022-02-19: qty 1

## 2022-02-19 MED ORDER — ACETAMINOPHEN-CAFFEINE 500-65 MG PO TABS
2.0000 | ORAL_TABLET | Freq: Once | ORAL | Status: AC
  Administered 2022-02-19: 2 via ORAL
  Filled 2022-02-19: qty 2

## 2022-02-19 MED ORDER — METOCLOPRAMIDE HCL 10 MG PO TABS: 10.0000 mg | ORAL_TABLET | Freq: Four times a day (QID) | ORAL | 0 refills | Status: DC | PRN

## 2022-02-19 MED ORDER — METOCLOPRAMIDE HCL 10 MG PO TABS
10.0000 mg | ORAL_TABLET | Freq: Once | ORAL | Status: AC
  Administered 2022-02-19: 10 mg via ORAL
  Filled 2022-02-19: qty 1

## 2022-02-19 NOTE — Progress Notes (Signed)
FHT for baby A and baby B visually noted via bedside US performed by provider.  + Movement for baby A and baby B

## 2022-02-19 NOTE — MAU Provider Note (Signed)
History     CSN: [id number]  Arrival date and time: 02/19/22 1621   Event Date/Time   First Provider Initiated Contact with Patient 02/19/22 1659      Chief Complaint  Patient presents with   Headache   Abdominal Pain   26 y.o. [id number] presenting with migraine and side pain. Reports onset of migraine this am. Pain is frontal and bilateral temporal. Light sensitivity is present. Rates pain 10/10. She tried Tylenol but it didn't help. Denies N/V. Also c/o right and left sided abdominal pain. Pain is worse on right. Describes as constant and sharp. Denies VB. Denies urinary sx. Denies back pain.     OB History     Gravida  2   Para  1   Term  1   Preterm      AB      Living  2      SAB      IAB      Ectopic      Multiple  1   Live Births  2           Past Medical History:  Diagnosis Date   Asthma    last used inhaler 2 months ago   Bronchitis    Eczema    Obesity    Seasonal allergies     Past Surgical History:  Procedure Laterality Date   WISDOM TOOTH EXTRACTION      Family History  Problem Relation Age of Onset   Arthritis Mother    Hepatitis Mother    Thyroid disease Mother    Diabetes Father    Heart disease Father    Hypertension Father    Breast cancer Maternal Aunt    Breast cancer Maternal Grandmother     Social History   Tobacco Use   Smoking status: Former    Types: Cigarettes    Quit date: 03/19/2018    Years since quitting: 3.9   Smokeless tobacco: Never  Vaping Use   Vaping Use: Never used  Substance Use Topics   Alcohol use: Never   Drug use: Never    Allergies:  Allergies  Allergen Reactions   Shellfish Allergy Itching, Shortness Of Breath and Hives    Medications Prior to Admission  Medication Sig Dispense Refill Last Dose   prenatal vitamin w/FE, FA (PRENATAL 1 + 1) 27-1 MG TABS tablet Take 1 tablet by mouth daily at 12 noon. 30 tablet 11 02/19/2022   PROAIR HFA 108 (90 Base) MCG/ACT inhaler Inhale into  the lungs.   02/19/2022   promethazine (PHENERGAN) 25 MG tablet Take 1 tablet (25 mg total) by mouth every 6 (six) hours as needed for nausea or vomiting. 30 tablet 1 02/19/2022   aspirin EC 81 MG tablet Take 2 tablets (162 mg total) by mouth daily. Swallow whole. 60 tablet 2    Blood Pressure Monitoring DEVI 1 each by Does not apply route once a week. 1 each 0    cyclobenzaprine (FLEXERIL) 10 MG tablet Take 1 tablet (10 mg total) by mouth 2 (two) times daily as needed for muscle spasms. (Patient not taking: Reported on 02/11/2022) 20 tablet 0    docusate sodium (COLACE) 100 MG capsule Take 1 capsule (100 mg total) by mouth 2 (two) times daily. 30 capsule 2     Review of Systems  Eyes:  Positive for photophobia. Negative for visual disturbance.  Gastrointestinal:  Positive for abdominal pain and nausea. Negative for vomiting.  Genitourinary:  Positive  for frequency. Negative for dysuria, urgency, vaginal bleeding and vaginal discharge.  Neurological:  Positive for headaches.   Physical Exam   Blood pressure (!) 117/59, pulse 87, temperature 98.2 F (36.8 C), temperature source Oral, resp. rate 16, height 5\' 4"  (1.626 m), weight (!) 150.5 kg, last menstrual period 11/11/2021, SpO2 99 %.  Physical Exam Vitals and nursing note reviewed. Exam conducted with a chaperone present.  Constitutional:      General: She is not in acute distress.    Appearance: Normal appearance.  HENT:     Head: Normocephalic and atraumatic.  Cardiovascular:     Rate and Rhythm: Normal rate.  Pulmonary:     Effort: Pulmonary effort is normal. No respiratory distress.  Abdominal:     General: There is no distension.     Palpations: Abdomen is soft. There is no mass.     Tenderness: There is no abdominal tenderness. There is no guarding or rebound.     Hernia: No hernia is present.  Musculoskeletal:        General: Normal range of motion.     Cervical back: Normal range of motion.  Skin:    General: Skin is  warm and dry.  Neurological:     General: No focal deficit present.     Mental Status: She is alert and oriented to person, place, and time.     Cranial Nerves: No cranial nerve deficit.     Motor: No weakness.     Deep Tendon Reflexes: Reflexes normal.  Psychiatric:        Mood and Affect: Mood is anxious.        Behavior: Behavior normal.   Limited bedside 11/13/2021: didi twins. active fetus x2, +cardiac activity x2, subj. nml AFV x2  Results for orders placed or performed during the hospital encounter of 02/19/22 (from the past 24 hour(s))  Urinalysis, Routine w reflex microscopic Urine, Clean Catch     Status: Abnormal   Collection Time: 02/19/22  4:24 PM  Result Value Ref Range   Color, Urine YELLOW YELLOW   APPearance CLEAR CLEAR   Specific Gravity, Urine 1.032 (H) 1.005 - 1.030   pH 6.0 5.0 - 8.0   Glucose, UA NEGATIVE NEGATIVE mg/dL   Hgb urine dipstick NEGATIVE NEGATIVE   Bilirubin Urine NEGATIVE NEGATIVE   Ketones, ur NEGATIVE NEGATIVE mg/dL   Protein, ur NEGATIVE NEGATIVE mg/dL   Nitrite NEGATIVE NEGATIVE   Leukocytes,Ua NEGATIVE NEGATIVE   MAU Course  Procedures Excedrin Benadryl Reglan  MDM Labs ordered and reviewed. HA and abd pain resolved. Abd pain likely MSK. No signs of acute process, UTI or threatened SAB. Stable for discharge home.   Assessment and Plan   1. Chronic hypertension during pregnancy, antepartum   2. Supervision of high risk pregnancy, antepartum   3. [redacted] weeks gestation of pregnancy   4. Dichorionic diamniotic twin pregnancy in first trimester   5. Acute non intractable tension-type headache   6. Generalized muscular abdominal pain    Discharge home Follow up at [facility] as scheduled Rx Reglan (for use with Benadryl and Excedrin) Return precautions  Allergies as of 02/19/2022       Reactions   Shellfish Allergy Itching, Shortness Of Breath, Hives        Medication List     TAKE these medications    aspirin EC 81 MG tablet Take 2  tablets (162 mg total) by mouth daily. Swallow whole.   Blood Pressure Monitoring Devi 1 each  by Does not apply route once a week.   cyclobenzaprine 10 MG tablet Commonly known as: FLEXERIL Take 1 tablet (10 mg total) by mouth 2 (two) times daily as needed for muscle spasms.   docusate sodium 100 MG capsule Commonly known as: Colace Take 1 capsule (100 mg total) by mouth 2 (two) times daily.   metoCLOPramide 10 MG tablet Commonly known as: Reglan Take 1 tablet (10 mg total) by mouth every 6 (six) hours as needed for nausea or vomiting (or headache).   prenatal vitamin w/FE, FA 27-1 MG Tabs tablet Take 1 tablet by mouth daily at 12 noon.   ProAir HFA 108 (90 Base) MCG/ACT inhaler Generic drug: albuterol Inhale into the lungs.   promethazine 25 MG tablet Commonly known as: PHENERGAN Take 1 tablet (25 mg total) by mouth every 6 (six) hours as needed for nausea or vomiting.       Donette Larry, CNM 02/19/2022, 6:53 PM

## 2022-02-19 NOTE — MAU Note (Signed)
Autumn Duarte is a 26 y.o. at [redacted]w[redacted]d here in MAU reporting: states doctor has been keeping an eye on her BP. Woke up this AM with a migraine and took 2 regular tylenol with no relief. Also having lower side pain. Right side is worse then left side. No bleeding or discharge.  Onset of complaint: today  Pain score: head 10/10, side pain 3/10  Vitals:   02/19/22 1635  BP: (!) 117/59  Pulse: 87  Resp: 16  Temp: 98.2 F (36.8 C)  SpO2: 99%     Lab orders placed from triage: UA

## 2022-02-20 LAB — PANORAMA PRENATAL TEST FULL PANEL:PANORAMA TEST PLUS 5 ADDITIONAL MICRODELETIONS
FETAL FRACTION SECOND FETUS: 4.7
FETAL FRACTION: 6.3

## 2022-02-20 LAB — HORIZON CUSTOM: REPORT SUMMARY: NEGATIVE

## 2022-03-03 ENCOUNTER — Other Ambulatory Visit: Payer: Self-pay

## 2022-03-03 ENCOUNTER — Ambulatory Visit (INDEPENDENT_AMBULATORY_CARE_PROVIDER_SITE_OTHER): Payer: Medicaid Other | Admitting: Family Medicine

## 2022-03-03 ENCOUNTER — Encounter: Payer: Self-pay | Admitting: Family Medicine

## 2022-03-03 VITALS — BP 126/76 | HR 104 | Wt 331.6 lb

## 2022-03-03 DIAGNOSIS — O10919 Unspecified pre-existing hypertension complicating pregnancy, unspecified trimester: Secondary | ICD-10-CM

## 2022-03-03 DIAGNOSIS — O30042 Twin pregnancy, dichorionic/diamniotic, second trimester: Secondary | ICD-10-CM

## 2022-03-03 DIAGNOSIS — O0990 Supervision of high risk pregnancy, unspecified, unspecified trimester: Secondary | ICD-10-CM

## 2022-03-03 NOTE — Progress Notes (Signed)
   PRENATAL VISIT NOTE  Subjective:  Autumn Duarte is a 26 y.o. G2P1002 at [redacted]w[redacted]d being seen today for ongoing prenatal care.  She is currently monitored for the following issues for this high-risk pregnancy and has ECZEMA; PROBLEMS WITH HEARING; Dichorionic diamniotic twin gestation; BMI 50.0-59.9, adult (HCC); Moderate major depression (HCC); Prediabetes; Adjustment disorder with mixed anxiety and depressed mood; Chronic hypertension during pregnancy, antepartum; Supervision of high risk pregnancy, antepartum; and Mild intermittent asthma without complication on their problem list.  Patient reports nausea and vomiting.  Contractions: Not present. Vag. Bleeding: None.  Movement: Absent. Denies leaking of fluid.   The following portions of the patient's history were reviewed and updated as appropriate: allergies, current medications, past family history, past medical history, past social history, past surgical history and problem list.   Objective:   Vitals:   03/03/22 0942  BP: 126/76  Pulse: (!) 104  Weight: (!) 331 lb 9.6 oz (150.4 kg)    Fetal Status:     Movement: Absent     General:  Alert, oriented and cooperative. Patient is in no acute distress.  Skin: Skin is warm and dry. No rash noted.   Cardiovascular: Normal heart rate noted  Respiratory: Normal respiratory effort, no problems with respiration noted  Abdomen: Soft, gravid, appropriate for gestational age.  Pain/Pressure: Present     Pelvic: Cervical exam deferred        Extremities: Normal range of motion.  Edema: None  Mental Status: Normal mood and affect. Normal behavior. Normal judgment and thought content.   Assessment and Plan:  Pregnancy: G2P1002 at [redacted]w[redacted]d 1. Supervision of high risk pregnancy, antepartum Continue prenatal care. Has phenergan and Reglan, declines other interventions.  2. Chronic hypertension during pregnancy, antepartum On no meds--will begin ASA this week  3. Dichorionic diamniotic twin  pregnancy in second trimester Bilateral + FHRs noted today on bedside u/s  General obstetric precautions including but not limited to vaginal bleeding, contractions, leaking of fluid and fetal movement were reviewed in detail with the patient. Please refer to After Visit Summary for other counseling recommendations.   Return in 4 weeks (on 03/31/2022) for [facility].  Future Appointments  Date Time Provider Department Center  03/28/2022  9:30 AM [facility] NURSE [facility]  03/28/2022  9:45 AM [facility]  03/31/2022  9:35 AM Reva Bores, MD [facility]    Reva Bores, MD

## 2022-03-06 ENCOUNTER — Other Ambulatory Visit: Payer: Self-pay | Admitting: Obstetrics and Gynecology

## 2022-03-18 ENCOUNTER — Encounter (HOSPITAL_COMMUNITY): Payer: Self-pay | Admitting: Obstetrics & Gynecology

## 2022-03-18 ENCOUNTER — Inpatient Hospital Stay (HOSPITAL_COMMUNITY)
Admission: AD | Admit: 2022-03-18 | Discharge: 2022-03-18 | Disposition: A | Discharge status: Home / Self Care | Payer: Medicaid Other | Attending: Obstetrics & Gynecology | Admitting: Obstetrics & Gynecology

## 2022-03-18 DIAGNOSIS — O99212 Obesity complicating pregnancy, second trimester: Secondary | ICD-10-CM | POA: Insufficient documentation

## 2022-03-18 DIAGNOSIS — M549 Dorsalgia, unspecified: Secondary | ICD-10-CM | POA: Diagnosis not present

## 2022-03-18 DIAGNOSIS — M545 Low back pain, unspecified: Secondary | ICD-10-CM | POA: Insufficient documentation

## 2022-03-18 DIAGNOSIS — O26892 Other specified pregnancy related conditions, second trimester: Secondary | ICD-10-CM | POA: Diagnosis present

## 2022-03-18 DIAGNOSIS — O99512 Diseases of the respiratory system complicating pregnancy, second trimester: Secondary | ICD-10-CM | POA: Insufficient documentation

## 2022-03-18 DIAGNOSIS — Z3A16 16 weeks gestation of pregnancy: Secondary | ICD-10-CM | POA: Insufficient documentation

## 2022-03-18 DIAGNOSIS — O99891 Other specified diseases and conditions complicating pregnancy: Secondary | ICD-10-CM | POA: Insufficient documentation

## 2022-03-18 LAB — URINALYSIS, ROUTINE W REFLEX MICROSCOPIC
Bilirubin Urine: NEGATIVE
Glucose, UA: NEGATIVE mg/dL
Hgb urine dipstick: NEGATIVE
Ketones, ur: 5 mg/dL — AB
Leukocytes,Ua: NEGATIVE
Nitrite: NEGATIVE
Protein, ur: NEGATIVE mg/dL
Specific Gravity, Urine: 1.019 (ref 1.005–1.030)
pH: 6 (ref 5.0–8.0)

## 2022-03-18 MED ORDER — IBUPROFEN 800 MG PO TABS
800.0000 mg | ORAL_TABLET | Freq: Once | ORAL | Status: AC
  Administered 2022-03-18: 800 mg via ORAL
  Filled 2022-03-18: qty 1

## 2022-03-18 NOTE — Discharge Instructions (Signed)

## 2022-03-18 NOTE — MAU Provider Note (Signed)
History     CSN: [id number]  Arrival date and time: 03/18/22 1951   Event Date/Time   First Provider Initiated Contact with Patient 03/18/22 2019      Chief Complaint  Patient presents with   Back Pain   HPI  Autumn Duarte is a 26 y.o. G2P1002 at [redacted]w[redacted]d who presents for evaluation of lower back pain. Patient reports she has struggled with low back pain in the pregnancy but today it is worse. She states she tried flexeril and tylenol with no relief. Patient rates the pain as a 10/10 and has not tried anything for the pain.   She denies any vaginal bleeding, discharge, and leaking of fluid. Denies any constipation, diarrhea or any urinary complaints. Reports normal fetal movement.   OB History     Gravida  2   Para  1   Term  1   Preterm      AB      Living  2      SAB      IAB      Ectopic      Multiple  1   Live Births  2           Past Medical History:  Diagnosis Date   Asthma    last used inhaler 2 months ago   Bronchitis    Eczema    Obesity    Seasonal allergies     Past Surgical History:  Procedure Laterality Date   WISDOM TOOTH EXTRACTION      Family History  Problem Relation Age of Onset   Arthritis Mother    Hepatitis Mother    Thyroid disease Mother    Diabetes Father    Heart disease Father    Hypertension Father    Breast cancer Maternal Aunt    Breast cancer Maternal Grandmother     Social History   Tobacco Use   Smoking status: Former    Types: Cigarettes    Quit date: 03/19/2018    Years since quitting: 4.0   Smokeless tobacco: Never  Vaping Use   Vaping Use: Never used  Substance Use Topics   Alcohol use: Never   Drug use: Never    Allergies:  Allergies  Allergen Reactions   Shellfish Allergy Itching, Shortness Of Breath and Hives    No medications prior to admission.    Review of Systems  Constitutional: Negative.  Negative for fatigue and fever.  HENT: Negative.    Respiratory: Negative.  Negative  for shortness of breath.   Cardiovascular: Negative.  Negative for chest pain.  Gastrointestinal: Negative.  Negative for abdominal pain, constipation, diarrhea, nausea and vomiting.  Genitourinary: Negative.  Negative for dysuria, vaginal bleeding and vaginal discharge.  Musculoskeletal:  Positive for back pain.  Neurological: Negative.  Negative for dizziness and headaches.   Physical Exam   Blood pressure 126/67, pulse 98, temperature 98.4 F (36.9 C), temperature source Oral, resp. rate 18, height 5\' 4"  (1.626 m), weight (!) 149.6 kg, last menstrual period 11/11/2021, SpO2 99 %.  Patient Vitals for the past 24 hrs:  BP Temp Temp src Pulse Resp SpO2 Height Weight  03/18/22 2133 126/67 -- -- 98 18 99 % -- --  03/18/22 2005 107/80 98.4 F (36.9 C) Oral (!) 125 16 98 % 5\' 4"  (1.626 m) (!) 149.6 kg    Physical Exam Vitals and nursing note reviewed.  Constitutional:      General: She is not in acute distress.  Appearance: She is well-developed.  HENT:     Head: Normocephalic.  Eyes:     Pupils: Pupils are equal, round, and reactive to light.  Cardiovascular:     Rate and Rhythm: Normal rate and regular rhythm.     Heart sounds: Normal heart sounds.  Pulmonary:     Effort: Pulmonary effort is normal. No respiratory distress.     Breath sounds: Normal breath sounds.  Abdominal:     General: Bowel sounds are normal. There is no distension.     Palpations: Abdomen is soft.     Tenderness: There is no abdominal tenderness.  Skin:    General: Skin is warm and dry.  Neurological:     Mental Status: She is alert and oriented to person, place, and time.  Psychiatric:        Mood and Affect: Mood normal.        Behavior: Behavior normal.        Thought Content: Thought content normal.        Judgment: Judgment normal.       MAU Course  Procedures  Results for orders placed or performed during the hospital encounter of 03/18/22 (from the past 24 hour(s))  Urinalysis,  Routine w reflex microscopic Urine, Clean Catch     Status: Abnormal   Collection Time: 03/18/22  8:27 PM  Result Value Ref Range   Color, Urine YELLOW YELLOW   APPearance HAZY (A) CLEAR   Specific Gravity, Urine 1.019 1.005 - 1.030   pH 6.0 5.0 - 8.0   Glucose, UA NEGATIVE NEGATIVE mg/dL   Hgb urine dipstick NEGATIVE NEGATIVE   Bilirubin Urine NEGATIVE NEGATIVE   Ketones, ur 5 (A) NEGATIVE mg/dL   Protein, ur NEGATIVE NEGATIVE mg/dL   Nitrite NEGATIVE NEGATIVE   Leukocytes,Ua NEGATIVE NEGATIVE     MDM Labs ordered and reviewed.   UA Discussed limited NSAID use in second trimester and risks and benefits. Patient agreeable to try Ibuprofen- patient reports complete resolution of pain  Pt informed that the ultrasound is considered a limited OB ultrasound and is not intended to be a complete ultrasound exam.  Patient also informed that the ultrasound is not being completed with the intent of assessing for fetal or placental anomalies or any pelvic abnormalities.  Explained that the purpose of today's ultrasound is to assess for  viability.  Patient acknowledges the purpose of the exam and the limitations of the study.    Baby A visualized with FHR 154 bpm Baby B visualized with FHR 163 bpm  Assessment and Plan   1. Back pain affecting pregnancy in second trimester   2. [redacted] weeks gestation of pregnancy     -Discharge home in stable condition -Second trimester precautions discussed -Patient advised to follow-up with OB as scheduled for prenatal care -Patient may return to MAU as needed or if her condition were to change or worsen  Rolm Bookbinder, CNM 03/18/2022, 11:56 PM

## 2022-03-18 NOTE — MAU Note (Signed)
..  Autumn Duarte is a 26 y.o. at [redacted]w[redacted]d here in MAU reporting: Lower back pain. The pain is sharp and constant but worse when she lays down. Was prescribed a muscle relaxer and tylenol for the pain but it has not helped. Last dose of both was last night.  Denies vaginal bleeding, abnormal discharge or abdominal pain.   Onset of complaint: Tuesday night Pain score: 2/10 Vitals:   03/18/22 2005  BP: 107/80  Pulse: (!) 125  Resp: 16  Temp: 98.4 F (36.9 C)  SpO2: 98%     AQT:MAUQJF to doppler twins in triage with single doppler.  Lab orders placed from triage:  UA

## 2022-03-28 ENCOUNTER — Ambulatory Visit: Payer: Medicaid Other | Admitting: *Deleted

## 2022-03-28 ENCOUNTER — Ambulatory Visit: Payer: Medicaid Other | Attending: Obstetrics and Gynecology

## 2022-03-28 ENCOUNTER — Encounter: Payer: Self-pay | Admitting: *Deleted

## 2022-03-28 ENCOUNTER — Other Ambulatory Visit: Payer: Self-pay | Admitting: *Deleted

## 2022-03-28 ENCOUNTER — Ambulatory Visit: Payer: Medicaid Other | Attending: Obstetrics and Gynecology | Admitting: Obstetrics and Gynecology

## 2022-03-28 ENCOUNTER — Other Ambulatory Visit: Payer: Self-pay | Admitting: Obstetrics and Gynecology

## 2022-03-28 VITALS — BP 128/82 | HR 106

## 2022-03-28 DIAGNOSIS — O10019 Pre-existing essential hypertension complicating pregnancy, unspecified trimester: Secondary | ICD-10-CM

## 2022-03-28 DIAGNOSIS — O10919 Unspecified pre-existing hypertension complicating pregnancy, unspecified trimester: Secondary | ICD-10-CM

## 2022-03-28 DIAGNOSIS — O99212 Obesity complicating pregnancy, second trimester: Secondary | ICD-10-CM

## 2022-03-28 DIAGNOSIS — O30042 Twin pregnancy, dichorionic/diamniotic, second trimester: Secondary | ICD-10-CM | POA: Insufficient documentation

## 2022-03-28 DIAGNOSIS — Z363 Encounter for antenatal screening for malformations: Secondary | ICD-10-CM | POA: Insufficient documentation

## 2022-03-28 DIAGNOSIS — Z3A17 17 weeks gestation of pregnancy: Secondary | ICD-10-CM

## 2022-03-28 DIAGNOSIS — Z3480 Encounter for supervision of other normal pregnancy, unspecified trimester: Secondary | ICD-10-CM

## 2022-03-28 DIAGNOSIS — O0990 Supervision of high risk pregnancy, unspecified, unspecified trimester: Secondary | ICD-10-CM

## 2022-03-28 DIAGNOSIS — O10912 Unspecified pre-existing hypertension complicating pregnancy, second trimester: Secondary | ICD-10-CM | POA: Insufficient documentation

## 2022-03-28 DIAGNOSIS — Z6841 Body Mass Index (BMI) 40.0 and over, adult: Secondary | ICD-10-CM

## 2022-03-28 DIAGNOSIS — O10012 Pre-existing essential hypertension complicating pregnancy, second trimester: Secondary | ICD-10-CM | POA: Diagnosis not present

## 2022-03-28 DIAGNOSIS — Z3A18 18 weeks gestation of pregnancy: Secondary | ICD-10-CM | POA: Diagnosis not present

## 2022-03-28 NOTE — Progress Notes (Signed)
Maternal-Fetal Medicine   Name: Autumn Duarte DOB: [birth year] MRN: [id number] Referring Provider: Tinnie Gens, MD  I had the pleasure of seeing [name] [facility] at [facility]. She is G2 P1002 at 17w 6 d gestation with twin pregnancy and is here for fetal anatomy scan. Dichorionic-diamniotic twin pregnancy was diagnosed at 6 weeks ultrasound.  This is a natural conception.  Obstetric history significant for a term twin vaginal delivery in May 2020 of 2 female infants.  Both are in good health.  Her pregnancy was otherwise uneventful.  Patient has a diagnosis of chronic hypertension but reports her blood pressures have been normal.  Today, her blood pressure is 128/82 mmHg.  She is not taking antihypertensives. She has class 3 obesity (prepregnancy BMI 57). Patient does not have diabetes.  Her most recent hemoglobin A1c was 5.4%. Medications: Prenatal vitamins, low-dose aspirin. Social history: Denies tobacco or drug or alcohol use. Family history: No history of venous thromboembolism in the family. Allergies: No known drug allergies.  On cell-free fetal DNA screening, the risks of fetal aneuploidies are not increased.  She does not have sickle cell trait or a carrier of alpha thalassemia.   Ultrasound  We confirmed dichorionic-diamniotic twin pregnancy. Twin A: Maternal right, lower fetus, heart transverse lie and head to maternal left, anterior placenta, female fetus.  Amniotic fluid is normal and good fetal activity seen.  Fetal biometry is consistent with the previously established dates.  No markers of aneuploidies or fetal structural defects are seen.  Twin B: Maternal left, upper fetus, transverse lie and head to maternal left, posterior placenta, female fetus. Amniotic fluid is normal and good fetal activity seen.  Fetal biometry is consistent with the previously established dates.  No markers of aneuploidies or fetal structural defects are seen.  Growth  discordancy: 8% (normal). As maternal obesity imposes limitations on the resolution of ultrasound, fetal anomalies may be missed.  Our concerns include: Dichorionic-diamniotic twin pregnancy I explained the significance of chorionicity and its implications. Possible complications associated with twin pregnancy include preterm labor/delivery (most common), fetal growth restriction of one or both twins, miscarriage, malpresentations and increased cesarean delivery rate, postpartum hemorrhage. I also informed her that maternal complications including gestational diabetes and gestational hypertension/preeclampsia are more common. I discussed ultrasound protocol of monitoring twin pregnancy.   Prophylactic low-dose aspirin delays or prevents preeclampsia. Twin pregnancy is at high risk for preeclampsia. I encouraged her to continue low-dose aspirin. She does not have contraindications to aspirin.  Recommendations -An appointment was made for her to return in 4 weeks for completion of fetal anatomy. -Serial fetal growth assessments every 4 weeks. -Weekly antenatal testing at 36 and 37 weeks. -Delivery at 38 weeks.  -Aspirin 81 mg daily until delivery.   Thank you for consultation.  If you have any questions or concerns, please contact me the [facility].  Consultation including face-to-face (more than 50%) counseling 30 minutes.

## 2022-03-31 ENCOUNTER — Other Ambulatory Visit: Payer: Self-pay

## 2022-03-31 ENCOUNTER — Ambulatory Visit (INDEPENDENT_AMBULATORY_CARE_PROVIDER_SITE_OTHER): Payer: Medicaid Other | Admitting: Family Medicine

## 2022-03-31 VITALS — BP 118/65 | HR 98 | Wt 337.3 lb

## 2022-03-31 DIAGNOSIS — O30042 Twin pregnancy, dichorionic/diamniotic, second trimester: Secondary | ICD-10-CM

## 2022-03-31 DIAGNOSIS — O0990 Supervision of high risk pregnancy, unspecified, unspecified trimester: Secondary | ICD-10-CM

## 2022-03-31 DIAGNOSIS — O10919 Unspecified pre-existing hypertension complicating pregnancy, unspecified trimester: Secondary | ICD-10-CM

## 2022-03-31 NOTE — Progress Notes (Signed)
   PRENATAL VISIT NOTE  Subjective:  Autumn Duarte is a 26 y.o. G2P1002 at [redacted]w[redacted]d being seen today for ongoing prenatal care.  She is currently monitored for the following issues for this low-risk pregnancy and has ECZEMA; PROBLEMS WITH HEARING; Dichorionic diamniotic twin gestation; BMI 50.0-59.9, adult (HCC); Moderate major depression (HCC); Prediabetes; Adjustment disorder with mixed anxiety and depressed mood; Chronic hypertension during pregnancy, antepartum; Supervision of high risk pregnancy, antepartum; and Mild intermittent asthma without complication on their problem list.  Patient reports no complaints.  Contractions: Not present. Vag. Bleeding: None.  Movement: Present. Denies leaking of fluid.   The following portions of the patient's history were reviewed and updated as appropriate: allergies, current medications, past family history, past medical history, past social history, past surgical history and problem list.   Objective:   Vitals:   03/31/22 1005  BP: 118/65  Pulse: 98  Weight: (!) 337 lb 4.8 oz (153 kg)    Fetal Status: Fetal Heart Rate (bpm): 147/151   Movement: Present     General:  Alert, oriented and cooperative. Patient is in no acute distress.  Skin: Skin is warm and dry. No rash noted.   Cardiovascular: Normal heart rate noted  Respiratory: Normal respiratory effort, no problems with respiration noted  Abdomen: Soft, gravid, appropriate for gestational age.  Pain/Pressure: Present     Pelvic: Cervical exam deferred        Extremities: Normal range of motion.  Edema: None  Mental Status: Normal mood and affect. Normal behavior. Normal judgment and thought content.   Assessment and Plan:  Pregnancy: G2P1002 at [redacted]w[redacted]d 1. Chronic hypertension during pregnancy, antepartum On no meds, on ASA  2. Dichorionic diamniotic twin pregnancy in second trimester Concordant female and female F/u growth with MFM q 4 wks  3. Supervision of high risk pregnancy,  antepartum AFP today - AFP, Serum, Open Spina Bifida  Preterm labor symptoms and general obstetric precautions including but not limited to vaginal bleeding, contractions, leaking of fluid and fetal movement were reviewed in detail with the patient. Please refer to After Visit Summary for other counseling recommendations.   Return in 4 weeks (on 04/28/2022).  Future Appointments  Date Time Provider Department Center  04/27/2022  7:45 AM [facility] NURSE [facility]  04/27/2022  8:00 AM [facility] US1 [facility]  04/27/2022  9:15 AM Warden Fillers, MD [facility]    Reva Bores, MD

## 2022-04-02 LAB — AFP, SERUM, OPEN SPINA BIFIDA
AFP MoM: 2.84
AFP Value: 91.6 ng/mL
Gest. Age on Collection Date: 18.2 weeks
Maternal Age At EDD: 25 yr
OSBR Risk 1 IN: 902
Test Results:: NEGATIVE
Weight: 337 [lb_av]

## 2022-04-04 ENCOUNTER — Other Ambulatory Visit: Payer: Self-pay

## 2022-04-04 ENCOUNTER — Encounter: Payer: Self-pay | Admitting: Family Medicine

## 2022-04-04 ENCOUNTER — Inpatient Hospital Stay (HOSPITAL_COMMUNITY)
Admission: AD | Admit: 2022-04-04 | Discharge: 2022-04-04 | Disposition: A | Discharge status: Home / Self Care | Payer: Medicaid Other | Attending: Obstetrics & Gynecology | Admitting: Obstetrics & Gynecology

## 2022-04-04 ENCOUNTER — Encounter (HOSPITAL_COMMUNITY): Payer: Self-pay | Admitting: Obstetrics & Gynecology

## 2022-04-04 DIAGNOSIS — R82998 Other abnormal findings in urine: Secondary | ICD-10-CM

## 2022-04-04 DIAGNOSIS — Z3A18 18 weeks gestation of pregnancy: Secondary | ICD-10-CM | POA: Insufficient documentation

## 2022-04-04 DIAGNOSIS — O30042 Twin pregnancy, dichorionic/diamniotic, second trimester: Secondary | ICD-10-CM | POA: Diagnosis not present

## 2022-04-04 DIAGNOSIS — O30002 Twin pregnancy, unspecified number of placenta and unspecified number of amniotic sacs, second trimester: Secondary | ICD-10-CM | POA: Insufficient documentation

## 2022-04-04 DIAGNOSIS — O10919 Unspecified pre-existing hypertension complicating pregnancy, unspecified trimester: Secondary | ICD-10-CM | POA: Diagnosis not present

## 2022-04-04 DIAGNOSIS — N9489 Other specified conditions associated with female genital organs and menstrual cycle: Secondary | ICD-10-CM

## 2022-04-04 DIAGNOSIS — O4692 Antepartum hemorrhage, unspecified, second trimester: Secondary | ICD-10-CM | POA: Diagnosis not present

## 2022-04-04 DIAGNOSIS — O4690 Antepartum hemorrhage, unspecified, unspecified trimester: Secondary | ICD-10-CM

## 2022-04-04 DIAGNOSIS — R102 Pelvic and perineal pain: Secondary | ICD-10-CM | POA: Diagnosis present

## 2022-04-04 LAB — WET PREP, GENITAL
Sperm: NONE SEEN
Trich, Wet Prep: NONE SEEN
WBC, Wet Prep HPF POC: >=10 — AB (ref <10)
Yeast Wet Prep HPF POC: NONE SEEN

## 2022-04-04 LAB — URINALYSIS, ROUTINE W REFLEX MICROSCOPIC
Bilirubin Urine: NEGATIVE
Glucose, UA: NEGATIVE mg/dL
Ketones, ur: NEGATIVE mg/dL
Nitrite: NEGATIVE
Protein, ur: NEGATIVE mg/dL
Specific Gravity, Urine: 1.001 — ABNORMAL LOW (ref 1.005–1.030)
pH: 7 (ref 5.0–8.0)

## 2022-04-04 MED ORDER — CEFADROXIL 500 MG PO CAPS
500.0000 mg | ORAL_CAPSULE | Freq: Two times a day (BID) | ORAL | 0 refills | Status: DC
Start: 2022-04-04 — End: 2022-05-08

## 2022-04-04 MED ORDER — IBUPROFEN 600 MG PO TABS
600.0000 mg | ORAL_TABLET | Freq: Once | ORAL | Status: AC
  Administered 2022-04-04: 600 mg via ORAL
  Filled 2022-04-04: qty 1

## 2022-04-04 NOTE — MAU Provider Note (Signed)
Chief Complaint:  Abdominal Pain and Urinary Frequency   Event Date/Time   First Provider Initiated Contact with Patient 04/04/22 0213     HPI: Autumn Duarte is a 26 y.o. G2P1002 at 8w6dwho presents to maternity admissions reporting pelvic cramping, urinary frequency and bleeding (red on tissue).  Also has headache. She denies LOF, vaginal itching/burning, dizziness, n/v, diarrhea, constipation or fever/chills.  History is remarkable for Twin birth 3 years ago and chronic hypertension  Abdominal Pain This is a new problem. The current episode started today. The onset quality is gradual. The problem occurs intermittently. The quality of the pain is cramping. The abdominal pain does not radiate. Associated symptoms include frequency and headaches. Pertinent negatives include no constipation, diarrhea, dysuria, fever, myalgias, nausea or vomiting. Nothing aggravates the pain. The pain is relieved by Nothing. She has tried nothing for the symptoms.  Urinary Frequency  This is a new problem. The current episode started in the past 7 days. The problem has been unchanged. Associated symptoms include frequency. Pertinent negatives include no chills, flank pain, nausea or vomiting.  Headache  This is a new problem. The current episode started today. The problem has been unchanged. Associated symptoms include abdominal pain. Pertinent negatives include no back pain, blurred vision, fever, nausea or vomiting. Nothing aggravates the symptoms. She has tried acetaminophen for the symptoms. The treatment provided no relief.   RN Note: Autumn Duarte is a 26 y.o. at [redacted]w[redacted]d here in MAU reporting: intermittent HA the last 2 days that is unrelieved with Tylenol, mid to lower ABD cramping, urinary frequency since 2030 yesterday, dysuria and VB that started heavy but is now light pink with wiping.No current HA. Pt called her OB office and was told to come in to be evaluated for UTI if VB started. PT LOF and abnormal  discharge. Last intercourse 3 days ago.  Past Medical History: Past Medical History:  Diagnosis Date   Asthma    last used inhaler 2 months ago   Bronchitis    Eczema    Obesity    Seasonal allergies     Past obstetric history: OB History  Gravida Para Term Preterm AB Living  2 1 1     2   SAB IAB Ectopic Multiple Live Births        1 2    # Outcome Date GA Lbr Len/2nd Weight Sex Delivery Anes PTL Lv  2 Current           1A Term 12/04/18 [redacted]w[redacted]d 11:32 / 01:20 2764 g F Vag-Spont EPI  LIV     Birth Comments: WDL  1B Term 12/04/18 [redacted]w[redacted]d 11:32 / 01:40 2554 g F Vag-Spont EPI  LIV     Birth Comments: WDL    Past Surgical History: Past Surgical History:  Procedure Laterality Date   WISDOM TOOTH EXTRACTION      Family History: Family History  Problem Relation Age of Onset   Arthritis Mother    Hepatitis Mother    Thyroid disease Mother    Diabetes Father    Heart disease Father    Hypertension Father    Breast cancer Maternal Aunt    Breast cancer Maternal Grandmother     Social History: Social History   Tobacco Use   Smoking status: Former    Types: Cigarettes    Quit date: 03/19/2018    Years since quitting: 4.0   Smokeless tobacco: Never  Vaping Use   Vaping Use: Never used  Substance Use  Topics   Alcohol use: Never   Drug use: Never    Allergies:  Allergies  Allergen Reactions   Shellfish Allergy Itching, Shortness Of Breath and Hives    Meds:  Medications Prior to Admission  Medication Sig Dispense Refill Last Dose   aspirin EC 81 MG tablet Take 2 tablets (162 mg total) by mouth daily. Swallow whole. 60 tablet 2 04/03/2022   cyclobenzaprine (FLEXERIL) 10 MG tablet Take 1 tablet (10 mg total) by mouth 2 (two) times daily as needed for muscle spasms. 20 tablet 0 Past Week   docusate sodium (COLACE) 100 MG capsule Take 1 capsule (100 mg total) by mouth 2 (two) times daily. 30 capsule 2 04/03/2022   metoCLOPramide (REGLAN) 10 MG tablet Take 1 tablet (10 mg  total) by mouth every 6 (six) hours as needed for nausea or vomiting (or headache). 20 tablet 0 04/03/2022   prenatal vitamin w/FE, FA (PRENATAL 1 + 1) 27-1 MG TABS tablet Take 1 tablet by mouth daily at 12 noon. 30 tablet 11 04/03/2022   Blood Pressure Monitoring DEVI 1 each by Does not apply route once a week. 1 each 0    PROAIR HFA 108 (90 Base) MCG/ACT inhaler Inhale into the lungs.       I have reviewed patient's Past Medical Hx, Surgical Hx, Family Hx, Social Hx, medications and allergies.   ROS:  Review of Systems  Constitutional:  Negative for chills and fever.  Eyes:  Negative for blurred vision.  Gastrointestinal:  Positive for abdominal pain. Negative for constipation, diarrhea, nausea and vomiting.  Genitourinary:  Positive for frequency. Negative for dysuria and flank pain.  Musculoskeletal:  Negative for back pain and myalgias.  Neurological:  Positive for headaches.   Other systems negative  Physical Exam  Patient Vitals for the past 24 hrs:  BP Temp Temp src Pulse Resp SpO2 Height Weight  04/04/22 0128 126/80 98 F (36.7 C) Oral (!) 106 18 99 % 5\' 4"  (1.626 m) (!) 153.3 kg   Constitutional: Well-developed, well-nourished female in no acute distress.  Cardiovascular: normal rate and rhythm Respiratory: normal effort, clear to auscultation bilaterally GI: Abd soft, non-tender, gravid appropriate for gestational age.   No rebound or guarding. MS: Extremities nontender, no edema, normal ROM Neurologic: Alert and oriented x 4.  GU: Neg CVAT.  PELVIC EXAM: Cervix pink, visually closed, without lesion, scant white creamy discharge, vaginal walls and external genitalia normal   No blood visible, no color to discharge Cultures sent.    Bimanual exam: Cervix firm, posterior, neg CMT    Labs: Results for orders placed or performed during the hospital encounter of 04/04/22 (from the past 24 hour(s))  Urinalysis, Routine w reflex microscopic     Status: Abnormal   Collection  Time: 04/04/22  1:37 AM  Result Value Ref Range   Color, Urine STRAW (A) YELLOW   APPearance CLEAR CLEAR   Specific Gravity, Urine 1.001 (L) 1.005 - 1.030   pH 7.0 5.0 - 8.0   Glucose, UA NEGATIVE NEGATIVE mg/dL   Hgb urine dipstick LARGE (A) NEGATIVE   Bilirubin Urine NEGATIVE NEGATIVE   Ketones, ur NEGATIVE NEGATIVE mg/dL   Protein, ur NEGATIVE NEGATIVE mg/dL   Nitrite NEGATIVE NEGATIVE   Leukocytes,Ua LARGE (A) NEGATIVE   RBC / HPF 6-10 0 - 5 RBC/hpf   WBC, UA 11-20 0 - 5 WBC/hpf   Bacteria, UA RARE (A) NONE SEEN   Squamous Epithelial / LPF 0-5 0 - 5  Wet prep, genital     Status: Abnormal   Collection Time: 04/04/22  2:29 AM  Result Value Ref Range   Yeast Wet Prep HPF POC NONE SEEN NONE SEEN   Trich, Wet Prep NONE SEEN NONE SEEN   Clue Cells Wet Prep HPF POC PRESENT (A) NONE SEEN   WBC, Wet Prep HPF POC >=10 (A) <10   Sperm NONE SEEN    Urine sent to culture O/Positive/-- (07/14 1116)  Imaging:  Last MFM US showed cervical length 4.1cm and normal twins  BEDSIDE ULTRASOUND Pt informed that the ultrasound is considered a limited OB ultrasound and is not intended to be a complete ultrasound exam.  Patient also informed that the ultrasound is not being completed with the intent of assessing for fetal or placental anomalies or any pelvic abnormalities.  Explained that the purpose of today's ultrasound is to assess for Fetal well being    Patient acknowledges the purpose of the exam and the limitations of the study.    Twin pregnancy with one twin on each side FHR 140s and 150s respectively Appearance grossly normal with movement.  MAU Course/MDM: I have reviewed the triage vital signs and the nursing notes.   Pertinent labs & imaging results that were available during my care of the patient were reviewed by me and considered in my medical decision making (see chart for details).      I have reviewed her medical records including past results, notes and treatments.   I have  ordered labs and reviewed results. UA suggestive of UTI  No blood or colored discharge in vagina.  Cervix long and closed.  Suspect possible rectal source of bleeding and urinary source of cramping.   Treatments in MAU included SSE, [place], one dose of ibuprofen for uterine cramping   Assessment: Twin Intrauterine Pregnancy at [redacted]w[redacted]d Episode of bleeding, question source No evidence of vaginal bleeding Resolution of cramping with ibuprofen   Plan: Discharge home Preterm Labor precautions and fetal kick counts Rx Duricef for possible UTI Urine to culture GC/Chlamydia/Wet prep done Follow up in Office for prenatal visits and recheck Encouraged to return if she develops worsening of symptoms, increase in pain, fever, or other concerning symptoms.   Pt stable at time of discharge.  Wynelle Bourgeois CNM, MSN Certified Nurse-Midwife 04/04/2022 2:13 AM

## 2022-04-04 NOTE — MAU Note (Signed)
Bedside US performed by Wynelle Bourgeois, CNM.

## 2022-04-04 NOTE — MAU Note (Signed)
.  Autumn Duarte is a 26 y.o. at [redacted]w[redacted]d here in MAU reporting: intermittent HA the last 2 days that is unrelieved with Tylenol, mid to lower ABD cramping, urinary frequency since 2030 yesterday, dysuria and VB that started heavy but is now light pink with wiping.No current HA. Pt called her OB office and was told to come in to be evaluated for UTI if VB started. PT LOF and abnormal discharge. Last intercourse 3 days ago.  Onset of complaint: 2 days Pain score: 7/10 Vitals:   04/04/22 0128  BP: 126/80  Pulse: (!) 106  Resp: 18  Temp: 98 F (36.7 C)  SpO2: 99%     VZD:GLOVFI to assess to due patient pain  Lab orders placed from triage:

## 2022-04-05 ENCOUNTER — Telehealth: Payer: Self-pay | Admitting: Obstetrics and Gynecology

## 2022-04-05 NOTE — Telephone Encounter (Signed)
Patient called stating she was told she could get a letter to be OOW from M-W of this week, but never received that letter. She would like to know if she could have one written.  Letter written and printed. Letter to go to registration desk by Sharlotte Alamo, NT+3 for patient to pick up.  Raelyn Mora, CNM

## 2022-04-06 LAB — CULTURE, OB URINE: Culture: 10000 — AB

## 2022-04-06 LAB — GC/CHLAMYDIA PROBE AMP (~~LOC~~) NOT AT ARMC
Chlamydia: NEGATIVE
Comment: NEGATIVE
Comment: NORMAL
Neisseria Gonorrhea: NEGATIVE

## 2022-04-27 ENCOUNTER — Other Ambulatory Visit: Payer: Self-pay | Admitting: *Deleted

## 2022-04-27 ENCOUNTER — Ambulatory Visit (INDEPENDENT_AMBULATORY_CARE_PROVIDER_SITE_OTHER): Payer: Medicaid Other | Admitting: Obstetrics and Gynecology

## 2022-04-27 ENCOUNTER — Ambulatory Visit: Payer: Medicaid Other | Admitting: *Deleted

## 2022-04-27 ENCOUNTER — Ambulatory Visit: Payer: Medicaid Other | Attending: Obstetrics and Gynecology

## 2022-04-27 ENCOUNTER — Other Ambulatory Visit: Payer: Self-pay

## 2022-04-27 VITALS — BP 110/66 | HR 108 | Wt 339.1 lb

## 2022-04-27 VITALS — BP 110/66 | HR 108

## 2022-04-27 DIAGNOSIS — O10012 Pre-existing essential hypertension complicating pregnancy, second trimester: Secondary | ICD-10-CM

## 2022-04-27 DIAGNOSIS — O30042 Twin pregnancy, dichorionic/diamniotic, second trimester: Secondary | ICD-10-CM | POA: Diagnosis not present

## 2022-04-27 DIAGNOSIS — O99212 Obesity complicating pregnancy, second trimester: Secondary | ICD-10-CM

## 2022-04-27 DIAGNOSIS — O0990 Supervision of high risk pregnancy, unspecified, unspecified trimester: Secondary | ICD-10-CM

## 2022-04-27 DIAGNOSIS — Z6841 Body Mass Index (BMI) 40.0 and over, adult: Secondary | ICD-10-CM | POA: Diagnosis present

## 2022-04-27 DIAGNOSIS — O0992 Supervision of high risk pregnancy, unspecified, second trimester: Secondary | ICD-10-CM

## 2022-04-27 DIAGNOSIS — Z3A22 22 weeks gestation of pregnancy: Secondary | ICD-10-CM

## 2022-04-27 DIAGNOSIS — O10919 Unspecified pre-existing hypertension complicating pregnancy, unspecified trimester: Secondary | ICD-10-CM | POA: Insufficient documentation

## 2022-04-27 DIAGNOSIS — E669 Obesity, unspecified: Secondary | ICD-10-CM

## 2022-04-27 MED ORDER — ASPIRIN 81 MG PO TBEC: 162.0000 mg | DELAYED_RELEASE_TABLET | Freq: Every day | ORAL | 5 refills | Status: DC

## 2022-04-27 NOTE — Progress Notes (Signed)
Pt reports headaches with light sensitivity, also states having a lot of pelvic pain when walking.

## 2022-04-27 NOTE — Progress Notes (Signed)
   PRENATAL VISIT NOTE  Subjective:  Autumn Duarte is a 26 y.o. G2P1002 at [redacted]w[redacted]d being seen today for ongoing prenatal care.  She is currently monitored for the following issues for this high-risk pregnancy and has ECZEMA; PROBLEMS WITH HEARING; Dichorionic diamniotic twin gestation; BMI 50.0-59.9, adult ([place]); Moderate major depression ([place]); Prediabetes; Adjustment disorder with mixed anxiety and depressed mood; Chronic hypertension during pregnancy, antepartum; Supervision of high risk pregnancy, antepartum; and Mild intermittent asthma without complication on their problem list.  Patient doing well with no acute concerns today. She reports  occasional mild headaches .  Contractions: Not present. Vag. Bleeding: None.  Movement: Present. Denies leaking of fluid.   The following portions of the patient's history were reviewed and updated as appropriate: allergies, current medications, past family history, past medical history, past social history, past surgical history and problem list. Problem list updated.  Objective:   Vitals:   04/27/22 0952  BP: 110/66  Pulse: (!) 108  Weight: (!) 339 lb 1.6 oz (153.8 kg)    Fetal Status: Fetal Heart Rate (bpm): 145/us   Movement: Present    Bedside ultrasound showed fetal movement ahd heartbeat x 2 General:  Alert, oriented and cooperative. Patient is in no acute distress.  Skin: Skin is warm and dry. No rash noted.   Cardiovascular: Normal heart rate noted  Respiratory: Normal respiratory effort, no problems with respiration noted  Abdomen: Soft, gravid, appropriate for gestational age.  Pain/Pressure: Present     Pelvic: Cervical exam deferred        Extremities: Normal range of motion.  Edema: None  Mental Status:  Normal mood and affect. Normal behavior. Normal judgment and thought content.   Assessment and Plan:  Pregnancy: G2P1002 at [redacted]w[redacted]d  1. Chronic hypertension during pregnancy, antepartum Pt well controlled off meds, pt has taken  baby ASA  2. Supervision of high risk pregnancy, antepartum Continue routine prenatal care - aspirin EC 81 MG tablet; Take 2 tablets (162 mg total) by mouth daily. Swallow whole.  Dispense: 60 tablet; Refill: 5  3. [redacted] weeks gestation of pregnancy   4. Dichorionic diamniotic twin pregnancy in second trimester Pt just received MFM scan No new findings currently  5. BMI 50.0-59.9, adult ([place])   Preterm labor symptoms and general obstetric precautions including but not limited to vaginal bleeding, contractions, leaking of fluid and fetal movement were reviewed in detail with the patient.  Please refer to After Visit Summary for other counseling recommendations.   Return in about 4 weeks (around 05/25/2022) for [facility], in person, 2 hr GTT, 3rd trim labs.   Lynnda Shields, MD Faculty Attending Center for [facility]

## 2022-05-02 ENCOUNTER — Encounter: Payer: Self-pay | Admitting: Obstetrics and Gynecology

## 2022-05-07 DIAGNOSIS — O26892 Other specified pregnancy related conditions, second trimester: Secondary | ICD-10-CM

## 2022-05-08 ENCOUNTER — Encounter (HOSPITAL_COMMUNITY): Payer: Self-pay | Admitting: Obstetrics and Gynecology

## 2022-05-08 ENCOUNTER — Inpatient Hospital Stay (HOSPITAL_COMMUNITY)
Admission: AD | Admit: 2022-05-08 | Discharge: 2022-05-08 | Disposition: A | Discharge status: Home / Self Care | Payer: Medicaid Other | Attending: Obstetrics and Gynecology | Admitting: Obstetrics and Gynecology

## 2022-05-08 DIAGNOSIS — M7918 Myalgia, other site: Secondary | ICD-10-CM

## 2022-05-08 DIAGNOSIS — O26892 Other specified pregnancy related conditions, second trimester: Secondary | ICD-10-CM | POA: Diagnosis not present

## 2022-05-08 DIAGNOSIS — O30042 Twin pregnancy, dichorionic/diamniotic, second trimester: Secondary | ICD-10-CM | POA: Diagnosis not present

## 2022-05-08 DIAGNOSIS — Z3A23 23 weeks gestation of pregnancy: Secondary | ICD-10-CM | POA: Diagnosis not present

## 2022-05-08 DIAGNOSIS — O26899 Other specified pregnancy related conditions, unspecified trimester: Secondary | ICD-10-CM

## 2022-05-08 DIAGNOSIS — R102 Pelvic and perineal pain: Secondary | ICD-10-CM | POA: Insufficient documentation

## 2022-05-08 DIAGNOSIS — R1032 Left lower quadrant pain: Secondary | ICD-10-CM | POA: Insufficient documentation

## 2022-05-08 DIAGNOSIS — N949 Unspecified condition associated with female genital organs and menstrual cycle: Secondary | ICD-10-CM

## 2022-05-08 LAB — URINALYSIS, ROUTINE W REFLEX MICROSCOPIC
Bacteria, UA: NONE SEEN
Bilirubin Urine: NEGATIVE
Glucose, UA: NEGATIVE mg/dL
Hgb urine dipstick: NEGATIVE
Ketones, ur: 80 mg/dL — AB
Leukocytes,Ua: NEGATIVE
Nitrite: NEGATIVE
Protein, ur: 30 mg/dL — AB
Specific Gravity, Urine: 1.024 (ref 1.005–1.030)
pH: 6 (ref 5.0–8.0)

## 2022-05-08 MED ORDER — TRAMADOL HCL 50 MG PO TABS: 50.0000 mg | ORAL_TABLET | Freq: Four times a day (QID) | ORAL | 0 refills | Status: DC | PRN

## 2022-05-08 NOTE — MAU Note (Signed)
Pt reports to mau with c/o constant lower left abd pain since yesterday.  Pt states she took her meds for muscle spasm last night with no relief.  Pt denies lof or vag bleeding.  Reports pain worsens when she moves or changes position.

## 2022-05-08 NOTE — Discharge Instructions (Signed)
   PREGNANCY SUPPORT BELT: You are not alone, Seventy-five percent of women have some sort of abdominal or back pain at some point in their pregnancy. Your baby is growing at a fast pace, which means that your whole body is rapidly trying to adjust to the changes. As your uterus grows, your back may start feeling a bit under stress and this can result in back or abdominal pain that can go from mild, and therefore bearable, to severe pains that will not allow you to sit or lay down comfortably, When it comes to dealing with pregnancy-related pains and cramps, some pregnant women usually prefer natural remedies, which the market is filled with nowadays. For example, wearing a pregnancy support belt can help ease and lessen your discomfort and pain. WHAT ARE THE BENEFITS OF WEARING A PREGNANCY SUPPORT BELT? A pregnancy support belt provides support to the lower portion of the belly taking some of the weight of the growing uterus and distributing to the other parts of your body. It is designed make you comfortable and gives you extra support. Over the years, the pregnancy apparel market has been studying the needs and wants of pregnant women and they have come up with the most comfortable pregnancy support belts that woman could ever ask for. In fact, you will no longer have to wear a stretched-out or bulky pregnancy belt that is visible underneath your clothes and makes you feel even more uncomfortable. Nowadays, a pregnancy support belt is made of comfortable and stretchy materials that will not irritate your skin but will actually make you feel at ease and you will not even notice you are wearing it. They are easy to put on and adjust during the day and can be worn at night for additional support.  BENEFITS: Relives Back pain Relieves Abdominal Muscle and Leg Pain Stabilizes the Pelvic Ring Offers a Cushioned Abdominal Lift Pad Relieves pressure on the Sciatic Nerve Within Minutes

## 2022-05-08 NOTE — MAU Provider Note (Signed)
Chief Complaint: Abdominal Pain   Event Date/Time   First Provider Initiated Contact with Patient 05/08/22 1559      SUBJECTIVE HPI: Autumn Duarte is a 26 y.o. G2P1002 at [redacted]w[redacted]d with di/di twins pregnancy by early ultrasound who presents to maternity admissions reporting significant pain in her LLQ, especially with movement.  The pain is sharp and intermittent, and sometimes makes it impossible to walk.  She has Flexeril at home but it is not helping.  She is feeling normal fetal movement.  Hx significant for previous di/di twins pregnancy with vaginal delivery at 37 weeks.    HPI  Past Medical History:  Diagnosis Date   Asthma    last used inhaler 2 months ago   Bronchitis    Eczema    Obesity    Seasonal allergies    Past Surgical History:  Procedure Laterality Date   WISDOM TOOTH EXTRACTION     Social History   Socioeconomic History   Marital status: Single    Spouse name: Not on file   Number of children: Not on file   Years of education: Not on file   Highest education level: Not on file  Occupational History   Not on file  Tobacco Use   Smoking status: Former    Types: Cigarettes    Quit date: 03/19/2018    Years since quitting: 4.1   Smokeless tobacco: Never  Vaping Use   Vaping Use: Never used  Substance and Sexual Activity   Alcohol use: Not Currently   Drug use: Never   Sexual activity: Yes    Birth control/protection: None  Other Topics Concern   Not on file  Social History Narrative   Not on file   Social Determinants of Health   Financial Resource Strain: Not on file  Food Insecurity: No Food Insecurity (03/31/2022)   Hunger Vital Sign    Worried About Running Out of Food in the Last Year: Never true    Ran Out of Food in the Last Year: Never true  Transportation Needs: No Transportation Needs (03/31/2022)   PRAPARE - Hydrologist (Medical): No    Lack of Transportation (Non-Medical): No  Physical Activity: Not on  file  Stress: Not on file  Social Connections: Not on file  Intimate Partner Violence: Not on file   No current facility-administered medications on file prior to encounter.   Current Outpatient Medications on File Prior to Encounter  Medication Sig Dispense Refill   aspirin EC 81 MG tablet Take 2 tablets (162 mg total) by mouth daily. Swallow whole. 60 tablet 5   cyclobenzaprine (FLEXERIL) 10 MG tablet Take 1 tablet (10 mg total) by mouth 2 (two) times daily as needed for muscle spasms. 20 tablet 0   docusate sodium (COLACE) 100 MG capsule Take 1 capsule (100 mg total) by mouth 2 (two) times daily. 30 capsule 2   prenatal vitamin w/FE, FA (PRENATAL 1 + 1) 27-1 MG TABS tablet Take 1 tablet by mouth daily at 12 noon. 30 tablet 11   Blood Pressure Monitoring DEVI 1 each by Does not apply route once a week. 1 each 0   metoCLOPramide (REGLAN) 10 MG tablet Take 1 tablet (10 mg total) by mouth every 6 (six) hours as needed for nausea or vomiting (or headache). 20 tablet 0   PROAIR HFA 108 (90 Base) MCG/ACT inhaler Inhale into the lungs.     Allergies  Allergen Reactions   Shellfish Allergy  Itching, Shortness Of Breath and Hives    ROS:  Review of Systems  Constitutional:  Negative for chills, fatigue and fever.  Eyes:  Negative for visual disturbance.  Respiratory:  Negative for shortness of breath.   Cardiovascular:  Negative for chest pain.  Gastrointestinal:  Positive for abdominal pain. Negative for nausea and vomiting.  Genitourinary:  Negative for difficulty urinating, dysuria, flank pain, pelvic pain, vaginal bleeding, vaginal discharge and vaginal pain.  Neurological:  Negative for dizziness and headaches.  Psychiatric/Behavioral: Negative.       I have reviewed patient's Past Medical Hx, Surgical Hx, Family Hx, Social Hx, medications and allergies.   Physical Exam  Patient Vitals for the past 24 hrs:  BP Temp Temp src Pulse Resp SpO2 Height Weight  05/08/22 1613 120/82 98.1  F (36.7 C) Oral (!) 113 16 -- -- --  05/08/22 1600 -- -- -- -- -- 100 % -- --  05/08/22 1555 -- -- -- -- -- 99 % -- --  05/08/22 1550 -- -- -- -- -- 100 % -- --  05/08/22 1545 -- -- -- -- -- 99 % -- --  05/08/22 1540 -- -- -- -- -- 100 % -- --  05/08/22 1535 -- -- -- -- -- 98 % -- --  05/08/22 1530 -- -- -- -- -- 98 % -- --  05/08/22 1525 -- -- -- -- -- 98 % -- --  05/08/22 1520 -- -- -- -- -- 97 % -- --  05/08/22 1515 -- -- -- -- -- 97 % -- --  05/08/22 1510 -- -- -- -- -- 99 % -- --  05/08/22 1505 -- -- -- -- -- 98 % -- --  05/08/22 1500 -- -- -- -- -- 97 % -- --  05/08/22 1455 -- -- -- -- -- 98 % -- --  05/08/22 1450 -- -- -- -- -- 97 % -- --  05/08/22 1445 -- -- -- -- -- 97 % -- --  05/08/22 1440 -- -- -- -- -- 98 % -- --  05/08/22 1420 109/69 97.8 F (36.6 C) Oral (!) 129 18 98 % 5\' 4"  (1.626 m) (!) 151.4 kg   Constitutional: Well-developed, well-nourished female in no acute distress.  Cardiovascular: normal rate Respiratory: normal effort GI: Abd soft, non-tender. Pos BS x 4 MS: Extremities nontender, no edema, normal ROM Neurologic: Alert and oriented x 4.  GU: Neg CVAT.  PELVIC EXAM: Deferred  Baby A: FHR baseline 145 with moderate variability and 10 x 10 accels  Baby B: FHR baseline 145 with moderate variability and 10 x 10 accels  Toco: no contractions on toco or to palpation  Dilation: Closed Effacement (%): Thick Station: -3 Exam by:: L. [place]    LAB RESULTS No results found for this or any previous visit (from the past 24 hour(s)).  O/Positive/-- (07/14 1116)  IMAGING  MAU Management/MDM: Orders Placed This Encounter  Procedures   Culture, OB Urine   Urinalysis, Routine w reflex microscopic Urine, Clean Catch   Discharge patient    Meds ordered this encounter  Medications   traMADol (ULTRAM) 50 MG tablet    Sig: Take 1-2 tablets (50-100 mg total) by mouth every 6 (six) hours as needed.    Dispense:  10 tablet    Refill:  0    Order  Specific Question:   Supervising Provider    Answer:   Aletha Halim [[id number]]    Cervix closed/thick/high, no signs of labor.  Pain c/w  round ligament or MSK pain, as it occurs with pt movement/position change.  Urine culture pending. Recommend rest/ice/heat/warm bath/increase PO fluids/Tylenol/pregnancy support belt for pain. Flexeril PRN as prescribed.  Pt not having relief with Flexeril so Tramadol 50-100 mg Q 6 hours PRN x 10 tablets only.  Pt to f/u with appt in office as scheduled, return to MAU as needed for emergencies.   ASSESSMENT 1. Round ligament pain   2. Dichorionic diamniotic twin pregnancy in second trimester   3. [redacted] weeks gestation of pregnancy   4. Musculoskeletal pain   5. Abdominal pain affecting pregnancy     PLAN Discharge home Allergies as of 05/08/2022       Reactions   Shellfish Allergy Itching, Shortness Of Breath, Hives        Medication List     STOP taking these medications    cefadroxil 500 MG capsule Commonly known as: DURICEF       TAKE these medications    aspirin EC 81 MG tablet Take 2 tablets (162 mg total) by mouth daily. Swallow whole.   Blood Pressure Monitoring Devi 1 each by Does not apply route once a week.   cyclobenzaprine 10 MG tablet Commonly known as: FLEXERIL Take 1 tablet (10 mg total) by mouth 2 (two) times daily as needed for muscle spasms.   docusate sodium 100 MG capsule Commonly known as: Colace Take 1 capsule (100 mg total) by mouth 2 (two) times daily.   metoCLOPramide 10 MG tablet Commonly known as: Reglan Take 1 tablet (10 mg total) by mouth every 6 (six) hours as needed for nausea or vomiting (or headache).   prenatal vitamin w/FE, FA 27-1 MG Tabs tablet Take 1 tablet by mouth daily at 12 noon.   ProAir HFA 108 (90 Base) MCG/ACT inhaler Generic drug: albuterol Inhale into the lungs.   traMADol 50 MG tablet Commonly known as: ULTRAM Take 1-2 tablets (50-100 mg total) by mouth every 6 (six) hours  as needed.        Follow-up [place] for [facility] Follow up.   Specialty: Obstetrics and Gynecology Why: As scheduled Contact information: [address] [place] [phone] [place] [facility] Follow up.   Specialty: Obstetrics and Gynecology Why: As needed for emergencies Contact information: [address] [id number] [place] Certified Nurse-Midwife 05/08/2022  4:38 PM

## 2022-05-09 LAB — CULTURE, OB URINE: Culture: NO GROWTH

## 2022-05-25 ENCOUNTER — Encounter: Payer: Medicaid Other | Admitting: Obstetrics and Gynecology

## 2022-05-25 ENCOUNTER — Other Ambulatory Visit: Payer: Self-pay

## 2022-05-26 ENCOUNTER — Other Ambulatory Visit: Payer: Self-pay | Admitting: General Practice

## 2022-05-26 ENCOUNTER — Encounter: Payer: Self-pay | Admitting: Family Medicine

## 2022-05-26 ENCOUNTER — Ambulatory Visit: Payer: Medicaid Other | Attending: Maternal & Fetal Medicine

## 2022-05-26 ENCOUNTER — Other Ambulatory Visit: Payer: Medicaid Other

## 2022-05-26 ENCOUNTER — Ambulatory Visit (INDEPENDENT_AMBULATORY_CARE_PROVIDER_SITE_OTHER): Payer: Medicaid Other | Admitting: Family Medicine

## 2022-05-26 ENCOUNTER — Ambulatory Visit: Payer: Medicaid Other | Admitting: *Deleted

## 2022-05-26 VITALS — BP 108/80 | HR 116 | Wt 339.2 lb

## 2022-05-26 VITALS — BP 129/66 | HR 107

## 2022-05-26 DIAGNOSIS — O30042 Twin pregnancy, dichorionic/diamniotic, second trimester: Secondary | ICD-10-CM | POA: Diagnosis not present

## 2022-05-26 DIAGNOSIS — O10012 Pre-existing essential hypertension complicating pregnancy, second trimester: Secondary | ICD-10-CM | POA: Diagnosis not present

## 2022-05-26 DIAGNOSIS — O0992 Supervision of high risk pregnancy, unspecified, second trimester: Secondary | ICD-10-CM

## 2022-05-26 DIAGNOSIS — O0990 Supervision of high risk pregnancy, unspecified, unspecified trimester: Secondary | ICD-10-CM

## 2022-05-26 DIAGNOSIS — O10919 Unspecified pre-existing hypertension complicating pregnancy, unspecified trimester: Secondary | ICD-10-CM

## 2022-05-26 DIAGNOSIS — O99212 Obesity complicating pregnancy, second trimester: Secondary | ICD-10-CM

## 2022-05-26 DIAGNOSIS — Z23 Encounter for immunization: Secondary | ICD-10-CM | POA: Diagnosis not present

## 2022-05-26 DIAGNOSIS — Z3A26 26 weeks gestation of pregnancy: Secondary | ICD-10-CM

## 2022-05-26 DIAGNOSIS — E669 Obesity, unspecified: Secondary | ICD-10-CM

## 2022-05-26 DIAGNOSIS — O10912 Unspecified pre-existing hypertension complicating pregnancy, second trimester: Secondary | ICD-10-CM

## 2022-05-26 NOTE — Progress Notes (Signed)
   PRENATAL VISIT NOTE  Subjective:  Autumn Duarte is a 26 y.o. G2P1002 at [redacted]w[redacted]d being seen today for ongoing prenatal care.  She is currently monitored for the following issues for this high-risk pregnancy and has ECZEMA; PROBLEMS WITH HEARING; Dichorionic diamniotic twin gestation; BMI 50.0-59.9, adult ([place]); Moderate major depression ([place]); Prediabetes; Adjustment disorder with mixed anxiety and depressed mood; Chronic hypertension during pregnancy, antepartum; Supervision of high risk pregnancy, antepartum; and Mild intermittent asthma without complication on their problem list.  Patient reports no complaints.  Contractions: Not present. Vag. Bleeding: None.  Movement: Present. Denies leaking of fluid.   The following portions of the patient's history were reviewed and updated as appropriate: allergies, current medications, past family history, past medical history, past social history, past surgical history and problem list.   Objective:   Vitals:   05/26/22 0900  BP: 108/80  Pulse: (!) 116  Weight: (!) 339 lb 3.2 oz (153.9 kg)    Fetal Status: Fetal Heart Rate (bpm): 141/152   Movement: Present     General:  Alert, oriented and cooperative. Patient is in no acute distress.  Skin: Skin is warm and dry. No rash noted.   Cardiovascular: Normal heart rate noted  Respiratory: Normal respiratory effort, no problems with respiration noted  Abdomen: Soft, gravid, appropriate for gestational age.  Pain/Pressure: Present     Pelvic: Cervical exam deferred        Extremities: Normal range of motion.  Edema: Trace  Mental Status: Normal mood and affect. Normal behavior. Normal judgment and thought content.   Assessment and Plan:  Pregnancy: G2P1002 at [redacted]w[redacted]d 1. Chronic hypertension during pregnancy, antepartum BP is well controlled on no meds Continue ASA  2. Dichorionic diamniotic twin pregnancy in second trimester Has f/u growth today  3. Supervision of high risk pregnancy,  antepartum 28 wk labs and TDaP today Discussed contraception.  She and partner are not sure about permanent contraception so will not sign BTL papers - Tdap vaccine greater than or equal to 24yo IM  Preterm labor symptoms and general obstetric precautions including but not limited to vaginal bleeding, contractions, leaking of fluid and fetal movement were reviewed in detail with the patient. Please refer to After Visit Summary for other counseling recommendations.   Return in 2 weeks (on 06/09/2022).  Future Appointments  Date Time Provider [place]  05/26/2022 10:15 AM Donnamae Jude, MD [facility]  05/26/2022  2:30 PM [facility] NURSE [facility]  05/26/2022  2:45 PM [facility] US6 [facility]  06/02/2022  8:00 AM Jule Economy, PT [facility] None  06/09/2022  1:55 PM Concepcion Living, MD [facility]  06/22/2022  1:55 PM Harolyn Rutherford Sallyanne Havers, MD [facility]  06/22/2022  2:30 PM [facility] NURSE [facility] [place]  06/22/2022  2:45 PM [facility] US5 [facility]  07/08/2022 10:55 AM Griffin Basil, MD [facility]  07/21/2022 10:15 AM Griffin Basil, MD [facility]    Donnamae Jude, MD

## 2022-05-27 ENCOUNTER — Other Ambulatory Visit: Payer: Self-pay | Admitting: *Deleted

## 2022-05-27 DIAGNOSIS — O99212 Obesity complicating pregnancy, second trimester: Secondary | ICD-10-CM

## 2022-05-27 DIAGNOSIS — O10912 Unspecified pre-existing hypertension complicating pregnancy, second trimester: Secondary | ICD-10-CM

## 2022-05-27 DIAGNOSIS — O30042 Twin pregnancy, dichorionic/diamniotic, second trimester: Secondary | ICD-10-CM

## 2022-05-27 LAB — CBC
Hematocrit: 30.7 % — ABNORMAL LOW (ref 34.0–46.6)
Hemoglobin: 10.2 g/dL — ABNORMAL LOW (ref 11.1–15.9)
MCH: 30.1 pg (ref 26.6–33.0)
MCHC: 33.2 g/dL (ref 31.5–35.7)
MCV: 91 fL (ref 79–97)
Platelets: 258 10*3/uL (ref 150–450)
RBC: 3.39 x10E6/uL — ABNORMAL LOW (ref 3.77–5.28)
RDW: 13.1 % (ref 11.7–15.4)
WBC: 9.6 10*3/uL (ref 3.4–10.8)

## 2022-05-27 LAB — GLUCOSE TOLERANCE, 2 HOURS W/ 1HR
Glucose, 1 hour: 162 mg/dL (ref 70–179)
Glucose, 2 hour: 119 mg/dL (ref 70–152)
Glucose, Fasting: 84 mg/dL (ref 70–91)

## 2022-05-27 LAB — RPR: RPR Ser Ql: NONREACTIVE

## 2022-05-27 LAB — HIV ANTIBODY (ROUTINE TESTING W REFLEX): HIV Screen 4th Generation wRfx: NONREACTIVE

## 2022-06-01 NOTE — Therapy (Addendum)
OUTPATIENT PHYSICAL THERAPY FEMALE PELVIC EVALUATION   Patient Name: Autumn Duarte MRN: [id number] DOB:05-27-1996, 26 y.o., female Today's Date: 06/02/2022   PT End of Session - 06/02/22 0803     Visit Number 1    Date for PT Re-Evaluation 08/25/22    Authorization Type Medicaid [employer]    PT Start Time 0803    PT Stop Time 0845    PT Time Calculation (min) 42 min    Activity Tolerance Patient tolerated treatment well    Behavior During Therapy WFL for tasks assessed/performed             Past Medical History:  Diagnosis Date   Asthma    last used inhaler 2 months ago   Bronchitis    Eczema    Obesity    Seasonal allergies    Past Surgical History:  Procedure Laterality Date   WISDOM TOOTH EXTRACTION     Patient Active Problem List   Diagnosis Date Noted   Supervision of high risk pregnancy, antepartum 01/18/2022   Chronic hypertension during pregnancy, antepartum 01/11/2022   Mild intermittent asthma without complication 72/62/0355   Adjustment disorder with mixed anxiety and depressed mood 01/29/2020   Prediabetes 11/18/2019   Dichorionic diamniotic twin gestation 06/11/2018   BMI 50.0-59.9, adult ([place]) 06/11/2018   Moderate major depression ([place]) 09/29/2016   PROBLEMS WITH HEARING 11/18/2008   ECZEMA 04/16/2007    PCP: Jonathon Bellows, PA-C (PCP)  REFERRING PROVIDER: Donnamae Jude, MD  REFERRING DIAG: [phone] (ICD-10-CM) - Pelvic pain affecting pregnancy in second trimester, antepartum  THERAPY DIAG:  Abnormal posture  Muscle weakness (generalized)  Other muscle spasm  Unspecified lack of coordination  Rationale for Evaluation and Treatment: Rehabilitation  ONSET DATE: [redacted] weeks pregnant  SUBJECTIVE:                                                                                                                                                                                           06/02/22 SUBJECTIVE STATEMENT: Pt is [redacted]  weeks pregnant and is having a lot of pain. She does have a doula that recommended PT. Due date 08/30/21 - will have induction on 08/16/21. This is she is pregnant with twins. She has twin 68 year olds.  Fluid intake: Yes: - no details this session    PAIN:  Are you having pain? Yes At hip level, she has pain wrapping around her. She states that she has cramps in low back towards the side. She does have pelvic pain that feels sharp/achy - right over pubic bone.  NPRS scale: 10/10 at worst Pain location: Bilateral  Pain  type: aching and sharp Pain description: intermittent   Aggravating factors: standing, walking Relieving factors: sitting  PRECAUTIONS: None  WEIGHT BEARING RESTRICTIONS: No  FALLS:  Has patient fallen in last 6 months? 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  PLOF: Independent  PATIENT GOALS: decrease pain  PERTINENT HISTORY:  G2P1 (2x twins), eczema, prediabetic, hypertension, anxiety/depression Sexual abuse: No  BOWEL MOVEMENT: Pain with bowel movement: No Type of bowel movement:Frequency 1x/day and Strain No Fully empty rectum: No Leakage: No Pads: No Fiber supplement: No Stool softener  URINATION: Pain with urination: No Fully empty bladder: Yes: - Stream: Strong Urgency: Yes: in the morning Frequency: every 2-3 hours, 2x/night, but doesn't wake her up, just if she is up for other reasons Leakage:  NA Pads: No  INTERCOURSE: Pain with intercourse:  NA Ability to have vaginal penetration:  Yes: - Climax: yes Marinoff Scale: 0/3  PREGNANCY: Vaginal deliveries 1 Tearing Yes: small - 2 sutures C-section deliveries 0 Currently pregnant Yes: 27 weeks   PROLAPSE: None   OBJECTIVE:  06/02/22  PATIENT SURVEYS:   PFIQ-7 67  COGNITION: Overall cognitive status: Within functional limits for tasks assessed     SENSATION: Light  touch: Appears intact Proprioception: Appears intact   FUNCTIONAL TESTS: impaired and painful bed mobility   GAIT:  Comments: slow, increased  bil circumduction  POSTURE: rounded shoulders, forward head, decreased thoracic kyphosis, anterior pelvic tilt, and Rt thoracic/lumbar curvature  LUMBARAROM/PROM:  A/PROM A/PROM  Eval (%)  Flexion 75, pain in anteiror pelvis  Extension 50  Right lateral flexion 75  Left lateral flexion 75, pain over Lt SIJ  Right rotation WNL  Left rotation WNL   (Blank rows = not tested)    LOWER EXTREMITY MMT: Grossly 4-/5   PALPATION:   General  Tenderness over bil SIJ                External Perineal Exam NA                             Internal Pelvic Floor NA  Patient confirms identification and approves PT to assess internal pelvic floor and treatment Yes No need this session   TODAY'S TREATMENT:                                                                                                                              DATE: 06/02/22   EVAL  Neuromuscular re-education: Core retraining:  Transversus abdominus training with multimodal cues for improved motor control and breath coordination Core facilitation: Seated march 2 x 10 Seated hip adduction 10 x 5 seconds Seated hip abduction 10x, red band Self-care: Pelvic support belt Lactation support Bed mobility  Check all possible CPT codes: 97110- Therapeutic Exercise and 97535 - Self Care    Check  all conditions that are expected to impact treatment: Musculoskeletal disorders and Current pregnancy or recent postpartum   If treatment provided at initial evaluation, no treatment charged due to lack of authorization.       PATIENT EDUCATION:  Education details: see above self-care Person educated: Patient Education method: Explanation, Demonstration, Tactile cues, Verbal cues, and Handouts Education comprehension: verbalized understanding  HOME EXERCISE  PROGRAM: [email or web address]  ASSESSMENT:  CLINICAL IMPRESSION: Patient is a 26 y.o. female who was seen today for physical therapy evaluation and treatment for pain during pregnancy. Exam findings include decreased lumbar A/ROM, decreased hip strength, tenderness to palpation of bil SIJs, painful bed mobility, and abnormal posture. Signs and symptoms are most consistent with lumbopelvic instability due to pregnancy. Initial treatment consisted of stability exercises, bed mobility training, and education on lactation support/pelvic stability belt. She will benefit form skilled PT intervention in order to decrease pain/manage pain throughout remainder of pregnancy, begin and progress functional strengthening program, prepare for delivery, and improve QOL.   OBJECTIVE IMPAIRMENTS: decreased activity tolerance, decreased coordination, decreased endurance, decreased mobility, decreased ROM, decreased strength, increased muscle spasms, postural dysfunction, and pain.   ACTIVITY LIMITATIONS: bending, standing, squatting, transfers, bed mobility, and locomotion level  PARTICIPATION LIMITATIONS: cleaning, laundry, and community activity  PERSONAL FACTORS: 1-2 comorbidities: G2P1 (2x twins), eczema, prediabetic, hypertension, anxiety/depression  are also affecting patient's functional outcome.   REHAB POTENTIAL: Good  CLINICAL DECISION MAKING: Stable/uncomplicated  EVALUATION COMPLEXITY: Low   GOALS: Goals reviewed with patient? Yes  SHORT TERM GOALS: Target date: 06/30/2022  Pt will be independent with HEP.   Baseline: Goal status: INITIAL  2.  Pt will be independent with diaphragmatic breathing and down training activities in order to improve pain and help activate core musculature.  Baseline:  Goal status: INITIAL    LONG TERM GOALS: Target date: 08/25/2022   Pt will be independent with advanced HEP.   Baseline:  Goal status: INITIAL  2.  Pt will increase all impaired lumbar A/ROM by 25%  without pain.  Baseline:  Goal status: INITIAL  3.  Pt will report 25% reduction in worst pain level and be independent with pain management activities during remainder of pregnancy.  Baseline:  Goal status: INITIAL  4.  Pt will demonstrate increase in all impaired hip strength by 1 muscle grades in order to demonstrate improved lumbopelvic support and increase functional ability.   Baseline:  Goal status: INITIAL  5.  Pt will be independent with transfers and bed mobility with appropriate body mechanics, intra-abdominal pressure management, and no increase in pain.  Baseline:  Goal status: INITIAL   PLAN:  PT FREQUENCY: 1x/week  PT DURATION: 12 weeks  PLANNED INTERVENTIONS: Therapeutic exercises, Therapeutic activity, Neuromuscular re-education, Balance training, Gait training, Patient/Family education, Self Care, Joint mobilization, Dry Needling, Biofeedback, and Manual therapy  PLAN FOR NEXT SESSION: Review stability exercises; progress to tolerance; manual techniques for pain management as needed.   Heather Roberts, PT, DPT11/02/239:03 AM  PHYSICAL THERAPY DISCHARGE SUMMARY  Visits from Start of Care: 0  Current functional level related to goals / functional outcomes: incomplete   Remaining deficits: See above   Education / Equipment: HEP   Patient agrees to discharge. Patient goals were not met. Patient is being discharged due to  patient delivering baby early.  Heather Roberts, PT, DPT12/19/2310:54 AM

## 2022-06-02 ENCOUNTER — Ambulatory Visit: Payer: Medicaid Other | Attending: Family Medicine

## 2022-06-02 ENCOUNTER — Other Ambulatory Visit: Payer: Self-pay

## 2022-06-02 DIAGNOSIS — M62838 Other muscle spasm: Secondary | ICD-10-CM | POA: Insufficient documentation

## 2022-06-02 DIAGNOSIS — R102 Pelvic and perineal pain: Secondary | ICD-10-CM | POA: Diagnosis not present

## 2022-06-02 DIAGNOSIS — O26892 Other specified pregnancy related conditions, second trimester: Secondary | ICD-10-CM | POA: Diagnosis not present

## 2022-06-02 DIAGNOSIS — R293 Abnormal posture: Secondary | ICD-10-CM | POA: Diagnosis not present

## 2022-06-02 DIAGNOSIS — M6281 Muscle weakness (generalized): Secondary | ICD-10-CM | POA: Diagnosis present

## 2022-06-02 DIAGNOSIS — R279 Unspecified lack of coordination: Secondary | ICD-10-CM | POA: Insufficient documentation

## 2022-06-02 NOTE — Patient Instructions (Signed)
Lactation consultant: Rhea Bleacher ([place]); [phone] -Lecithin to help with clogged ducts  Aeroflow: look at stability belts (motif stability stability belt)     [facility] [address], [place], [place] [address] Phone # [phone] Fax [phone]

## 2022-06-06 NOTE — Therapy (Incomplete)
OUTPATIENT PHYSICAL THERAPY TREATMENT NOTE   Patient Name: Autumn Duarte MRN: [id number] DOB:[birth year], 26 y.o., female Today's Date: 06/06/2022  PCP: Jonathon Bellows, PA-C REFERRING PROVIDER: Donnamae Jude, MD  END OF SESSION:    Past Medical History:  Diagnosis Date   Asthma    last used inhaler 2 months ago   Bronchitis    Eczema    Obesity    Seasonal allergies    Past Surgical History:  Procedure Laterality Date   WISDOM TOOTH EXTRACTION     Patient Active Problem List   Diagnosis Date Noted   Supervision of high risk pregnancy, antepartum 01/18/2022   Chronic hypertension during pregnancy, antepartum 01/11/2022   Mild intermittent asthma without complication [id number]   Adjustment disorder with mixed anxiety and depressed mood 01/29/2020   Prediabetes 11/18/2019   Dichorionic diamniotic twin gestation 06/11/2018   BMI 50.0-59.9, adult ([place]) 06/11/2018   Moderate major depression ([place]) 09/29/2016   PROBLEMS WITH HEARING 11/18/2008   ECZEMA 04/16/2007    REFERRING DIAG: [id number] (ICD-10-CM) - Pelvic pain affecting pregnancy in second trimester, antepartum  THERAPY DIAG:  No diagnosis found.  Rationale for Evaluation and Treatment Rehabilitation  PERTINENT HISTORY: G2P1 (2x twins), eczema, prediabetic, hypertension, anxiety/depression  PRECAUTIONS: NA  SUBJECTIVE:                                                                                                                                                                                      SUBJECTIVE STATEMENT:  ***   PAIN:  Are you having pain? {OPRCPAIN:[id number]}   06/02/22 SUBJECTIVE STATEMENT: Pt is [redacted] weeks pregnant and is having a lot of pain. She does have a doula that recommended PT. Due date 08/30/21 - will have induction on 08/16/21. This is she is pregnant with twins. She has twin 4 year olds.  Fluid intake: Yes: - no details this session     PAIN:  Are you having pain? Yes  At hip level, she has pain wrapping around her. She states that she has cramps in low back towards the side. She does have pelvic pain that feels sharp/achy - right over pubic bone.  NPRS scale: 10/10 at worst Pain location: Bilateral   Pain type: aching and sharp Pain description: intermittent    Aggravating factors: standing, walking Relieving factors: sitting   PRECAUTIONS: None   WEIGHT BEARING RESTRICTIONS: No   FALLS:  Has patient fallen in last 6 months? 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   PLOF: Independent   PATIENT GOALS: decrease  pain   PERTINENT HISTORY:  G2P1 (2x twins), eczema, prediabetic, hypertension, anxiety/depression Sexual abuse: No   BOWEL MOVEMENT: Pain with bowel movement: No Type of bowel movement:Frequency 1x/day and Strain No Fully empty rectum: No Leakage: No Pads: No Fiber supplement: No Stool softener   URINATION: Pain with urination: No Fully empty bladder: Yes: - Stream: Strong Urgency: Yes: in the morning Frequency: every 2-3 hours, 2x/night, but doesn't wake her up, just if she is up for other reasons Leakage:  NA Pads: No   INTERCOURSE: Pain with intercourse:  NA Ability to have vaginal penetration:  Yes: - Climax: yes Marinoff Scale: 0/3   PREGNANCY: Vaginal deliveries 1 Tearing Yes: small - 2 sutures C-section deliveries 0 Currently pregnant Yes: 27 weeks    PROLAPSE: None     OBJECTIVE:  06/02/22   PATIENT SURVEYS:    PFIQ-7 67   COGNITION: Overall cognitive status: Within functional limits for tasks assessed                          SENSATION: Light touch: Appears intact Proprioception: Appears intact     FUNCTIONAL TESTS: impaired and painful bed mobility     GAIT:   Comments: slow, increased  bil circumduction   POSTURE: rounded shoulders, forward head, decreased thoracic  kyphosis, anterior pelvic tilt, and Rt thoracic/lumbar curvature   LUMBARAROM/PROM:   A/PROM A/PROM  Eval (%)  Flexion 75, pain in anteiror pelvis  Extension 50  Right lateral flexion 75  Left lateral flexion 75, pain over Lt SIJ  Right rotation WNL  Left rotation WNL   (Blank rows = not tested)       LOWER EXTREMITY MMT: Grossly 4-/5     PALPATION:   General  Tenderness over bil SIJ                 External Perineal Exam NA                             Internal Pelvic Floor NA   Patient confirms identification and approves PT to assess internal pelvic floor and treatment Yes No need this session     TODAY'S TREATMENT 06/08/22:  Manual: Soft tissue mobilization: Scar tissue mobilization: Myofascial release: Spinal mobilization: Internal pelvic floor techniques: Dry needling: Neuromuscular re-education: Core retraining:  Core facilitation: Form correction: Pelvic floor contraction training: Down training: Exercises: Stretches/mobility: Strengthening: Therapeutic activities: Functional strengthening activities: Self-care:                                                                                                                               DATE: 06/02/22      EVAL  Neuromuscular re-education: Core retraining:  Transversus abdominus training with multimodal cues for improved motor control and breath coordination Core facilitation: Seated march 2 x 10 Seated hip adduction 10 x 5 seconds Seated hip  abduction 10x, red band Self-care: Pelvic support belt Lactation support Bed mobility   Check all possible CPT codes: 97110- Therapeutic Exercise and 97535 - Self Care                         Check all conditions that are expected to impact treatment: Musculoskeletal disorders and Current pregnancy or recent postpartum              If treatment provided at initial evaluation, no treatment charged due to lack of authorization.                                 PATIENT EDUCATION:  Education details: see above self-care Person educated: Patient Education method: Explanation, Demonstration, Tactile cues, Verbal cues, and Handouts Education comprehension: verbalized understanding   HOME EXERCISE PROGRAM: [email or web address]   ASSESSMENT:   CLINICAL IMPRESSION: She will continue to benefit form skilled PT intervention in order to decrease pain/manage pain throughout remainder of pregnancy, begin and progress functional strengthening program, prepare for delivery, and improve QOL.    OBJECTIVE IMPAIRMENTS: decreased activity tolerance, decreased coordination, decreased endurance, decreased mobility, decreased ROM, decreased strength, increased muscle spasms, postural dysfunction, and pain.    ACTIVITY LIMITATIONS: bending, standing, squatting, transfers, bed mobility, and locomotion level   PARTICIPATION LIMITATIONS: cleaning, laundry, and community activity   PERSONAL FACTORS: 1-2 comorbidities: G2P1 (2x twins), eczema, prediabetic, hypertension, anxiety/depression  are also affecting patient's functional outcome.    REHAB POTENTIAL: Good   CLINICAL DECISION MAKING: Stable/uncomplicated   EVALUATION COMPLEXITY: Low     GOALS: Goals reviewed with patient? Yes   SHORT TERM GOALS: Target date: 06/30/2022   Pt will be independent with HEP.    Baseline: Goal status: INITIAL   2.  Pt will be independent with diaphragmatic breathing and down training activities in order to improve pain and help activate core musculature.   Baseline:  Goal status: INITIAL       LONG TERM GOALS: Target date: 08/25/2022    Pt will be independent with advanced HEP.    Baseline:  Goal status: INITIAL   2.  Pt will increase all impaired lumbar A/ROM by 25% without pain.   Baseline:  Goal status: INITIAL   3.  Pt will report 25% reduction in worst pain level and be independent with pain management activities during remainder of pregnancy.  Baseline:  Goal  status: INITIAL   4.  Pt will demonstrate increase in all impaired hip strength by 1 muscle grades in order to demonstrate improved lumbopelvic support and increase functional ability.    Baseline:  Goal status: INITIAL   5.  Pt will be independent with transfers and bed mobility with appropriate body mechanics, intra-abdominal pressure management, and no increase in pain.  Baseline:  Goal status: INITIAL     PLAN:   PT FREQUENCY: 1x/week   PT DURATION: 12 weeks   PLANNED INTERVENTIONS: Therapeutic exercises, Therapeutic activity, Neuromuscular re-education, Balance training, Gait training, Patient/Family education, Self Care, Joint mobilization, Dry Needling, Biofeedback, and Manual therapy   PLAN FOR NEXT SESSION: Review stability exercises; progress to tolerance; manual techniques for pain management as needed.   Heather Roberts, PT, DPT11/12/2310:24 PM

## 2022-06-08 ENCOUNTER — Telehealth: Payer: Self-pay | Admitting: Family Medicine

## 2022-06-08 ENCOUNTER — Ambulatory Visit: Payer: Medicaid Other

## 2022-06-08 NOTE — Telephone Encounter (Signed)
Patient called and stated that she is having fluid come out or discharge and when she cough she is having severe pain across here stomach, would like a nurse to call back.

## 2022-06-09 ENCOUNTER — Ambulatory Visit (INDEPENDENT_AMBULATORY_CARE_PROVIDER_SITE_OTHER): Payer: Medicaid Other | Admitting: Family Medicine

## 2022-06-09 ENCOUNTER — Other Ambulatory Visit: Payer: Self-pay

## 2022-06-09 VITALS — BP 102/67 | HR 123 | Wt 341.4 lb

## 2022-06-09 DIAGNOSIS — O0990 Supervision of high risk pregnancy, unspecified, unspecified trimester: Secondary | ICD-10-CM

## 2022-06-09 DIAGNOSIS — O10913 Unspecified pre-existing hypertension complicating pregnancy, third trimester: Secondary | ICD-10-CM

## 2022-06-09 DIAGNOSIS — O0993 Supervision of high risk pregnancy, unspecified, third trimester: Secondary | ICD-10-CM

## 2022-06-09 DIAGNOSIS — O30043 Twin pregnancy, dichorionic/diamniotic, third trimester: Secondary | ICD-10-CM

## 2022-06-09 DIAGNOSIS — O30042 Twin pregnancy, dichorionic/diamniotic, second trimester: Secondary | ICD-10-CM

## 2022-06-09 DIAGNOSIS — O26893 Other specified pregnancy related conditions, third trimester: Secondary | ICD-10-CM

## 2022-06-09 DIAGNOSIS — O10919 Unspecified pre-existing hypertension complicating pregnancy, unspecified trimester: Secondary | ICD-10-CM

## 2022-06-09 DIAGNOSIS — Z3A28 28 weeks gestation of pregnancy: Secondary | ICD-10-CM

## 2022-06-09 DIAGNOSIS — R109 Unspecified abdominal pain: Secondary | ICD-10-CM

## 2022-06-09 DIAGNOSIS — O26899 Other specified pregnancy related conditions, unspecified trimester: Secondary | ICD-10-CM

## 2022-06-09 MED ORDER — FERROUS SULFATE 325 (65 FE) MG PO TABS: 325.0000 mg | ORAL_TABLET | ORAL | 3 refills | Status: DC

## 2022-06-09 NOTE — Telephone Encounter (Signed)
Called patient and she reports coughing spells mainly in the morning with leaking fluid when that happens as well as abdominal pain. She denies congestion but does report issues with heartburn recently. Recommended she discuss it with the provider at her appt today to see what he says. Patient verbalized understanding.

## 2022-06-09 NOTE — Progress Notes (Signed)
   PRENATAL VISIT NOTE  Subjective:  Autumn Duarte is a 26 y.o. G2P1002 at [redacted]w[redacted]d being seen today for ongoing prenatal care.  She is currently monitored for the following issues for this high-risk pregnancy and has ECZEMA; PROBLEMS WITH HEARING; Dichorionic diamniotic twin gestation; BMI 50.0-59.9, adult ([facility]); Moderate major depression ([facility]); Prediabetes; Adjustment disorder with mixed anxiety and depressed mood; Chronic hypertension during pregnancy, antepartum; Supervision of high risk pregnancy, antepartum; and Mild intermittent asthma without complication on their problem list.  Patient reports backache, no bleeding, no contractions, no cramping, and no leaking.  Contractions: Not present. Vag. Bleeding: Other.  Movement: Present. Denies leaking of fluid.   The following portions of the patient's history were reviewed and updated as appropriate: allergies, current medications, past family history, past medical history, past social history, past surgical history and problem list.   Objective:   Vitals:   06/09/22 1425 06/09/22 1529  BP: 135/82 102/67  Pulse: (!) 120 (!) 123  Weight: (!) 341 lb 6.4 oz (154.9 kg)     Fetal Status: Fetal Heart Rate (bpm): 147   Movement: Present     General:  Alert, oriented and cooperative. Patient is in no acute distress.  Skin: Skin is warm and dry. No rash noted.   Cardiovascular: Normal heart rate noted  Respiratory: Normal respiratory effort, no problems with respiration noted  Abdomen: Soft, gravid, appropriate for gestational age.  Pain/Pressure: Present     Pelvic: Cervical exam deferred        Extremities: Normal range of motion.  Edema: None  Mental Status: Normal mood and affect. Normal behavior. Normal judgment and thought content.   Assessment and Plan:  Pregnancy: G2P1002 at [redacted]w[redacted]d 1. Supervision of high risk pregnancy, antepartum Discussed 28-week labs with the patient. Next visit scheduled in 2 weeks.  2. Chronic hypertension  during pregnancy, antepartum BP within normal limits today.  Patient has blood pressure cuff.  Patient to continue ASA.  3. Dichorionic diamniotic twin pregnancy in second trimester Growth ultrasound completed 10/26 with a 13% discordance between twin A and twin B.  Follow-up growth scan in 4 weeks has been scheduled.  4. Abdominal pain affecting pregnancy Abdominal pain has been improving  5. [redacted] weeks gestation of pregnancy Continue routine prenatal care.  Preterm labor symptoms and general obstetric precautions including but not limited to vaginal bleeding, contractions, leaking of fluid and fetal movement were reviewed in detail with the patient. Please refer to After Visit Summary for other counseling recommendations.   No follow-ups on file.  Future Appointments  Date Time Provider Department Center  06/20/2022  8:00 AM Julio Alm A, PT [facility] None  06/22/2022  1:55 PM Anyanwu, Jethro Bastos, MD [facility]  06/22/2022  2:30 PM [facility] NURSE [facility]  06/22/2022  2:45 PM [facility] US5 [facility]  06/27/2022  8:00 AM Julio Alm A, PT [facility] None  07/08/2022 10:55 AM Warden Fillers, MD [facility]  07/14/2022  2:30 PM [facility] NURSE [facility]  07/14/2022  2:45 PM [facility] US4 [facility]  07/21/2022 10:15 AM Warden Fillers, MD [facility]  07/22/2022  2:30 PM [facility] NURSE [facility]  07/22/2022  2:45 PM [facility] US5 [facility]  08/02/2022  8:00 AM Marisue Ivan, PT [facility] None  08/09/2022  8:00 AM Steer, Roslyn Smiling, PT [facility] None    Celedonio Savage, MD

## 2022-06-13 ENCOUNTER — Ambulatory Visit: Payer: Medicaid Other

## 2022-06-19 NOTE — Therapy (Incomplete)
OUTPATIENT PHYSICAL THERAPY TREATMENT NOTE   Patient Name: Autumn Duarte MRN: [id number] DOB:1995/12/22, 26 y.o., female Today's Date: 06/19/2022  PCP: Jonathon Bellows, PA-C  REFERRING PROVIDER: Donnamae Jude, MD  END OF SESSION:    Past Medical History:  Diagnosis Date   Asthma    last used inhaler 2 months ago   Bronchitis    Eczema    Obesity    Seasonal allergies    Past Surgical History:  Procedure Laterality Date   WISDOM TOOTH EXTRACTION     Patient Active Problem List   Diagnosis Date Noted   Supervision of high risk pregnancy, antepartum 01/18/2022   Chronic hypertension during pregnancy, antepartum 01/11/2022   Mild intermittent asthma without complication [id number]   Adjustment disorder with mixed anxiety and depressed mood 01/29/2020   Prediabetes 11/18/2019   Dichorionic diamniotic twin gestation 06/11/2018   BMI 50.0-59.9, adult ([place]) 06/11/2018   Moderate major depression ([place]) 09/29/2016   PROBLEMS WITH HEARING 11/18/2008   ECZEMA 04/16/2007    REFERRING DIAG: [id number] (ICD-10-CM) - Pelvic pain affecting pregnancy in second trimester, antepartum  THERAPY DIAG:  No diagnosis found.  Rationale for Evaluation and Treatment Rehabilitation  PERTINENT HISTORY: G2P1 (2x twins), eczema, prediabetic, hypertension, anxiety/depression  PRECAUTIONS: Pregnancy with twins  SUBJECTIVE:                                                                                                                                                                                      SUBJECTIVE STATEMENT:  ***   PAIN:  Are you having pain? {OPRCPAIN:[id number]}   06/02/22 SUBJECTIVE STATEMENT: Pt is [redacted] weeks pregnant and is having a lot of pain. She does have a doula that recommended PT. Due date 08/30/21 - will have induction on 08/16/21. This is she is pregnant with twins. She has twin 50 year olds.  Fluid intake: Yes: - no details this session     PAIN:  Are  you having pain? Yes At hip level, she has pain wrapping around her. She states that she has cramps in low back towards the side. She does have pelvic pain that feels sharp/achy - right over pubic bone.  NPRS scale: 10/10 at worst Pain location: Bilateral   Pain type: aching and sharp Pain description: intermittent    Aggravating factors: standing, walking Relieving factors: sitting   PRECAUTIONS: None   WEIGHT BEARING RESTRICTIONS: No   FALLS:  Has patient fallen in last 6 months? 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   PLOF: Independent  PATIENT GOALS: decrease pain   PERTINENT HISTORY:  G2P1 (2x twins), eczema, prediabetic, hypertension, anxiety/depression Sexual abuse: No   BOWEL MOVEMENT: Pain with bowel movement: No Type of bowel movement:Frequency 1x/day and Strain No Fully empty rectum: No Leakage: No Pads: No Fiber supplement: No Stool softener   URINATION: Pain with urination: No Fully empty bladder: Yes: - Stream: Strong Urgency: Yes: in the morning Frequency: every 2-3 hours, 2x/night, but doesn't wake her up, just if she is up for other reasons Leakage:  NA Pads: No   INTERCOURSE: Pain with intercourse:  NA Ability to have vaginal penetration:  Yes: - Climax: yes Marinoff Scale: 0/3   PREGNANCY: Vaginal deliveries 1 Tearing Yes: small - 2 sutures C-section deliveries 0 Currently pregnant Yes: 27 weeks    PROLAPSE: None     OBJECTIVE:  06/02/22   PATIENT SURVEYS:    PFIQ-7 67   COGNITION: Overall cognitive status: Within functional limits for tasks assessed                          SENSATION: Light touch: Appears intact Proprioception: Appears intact     FUNCTIONAL TESTS: impaired and painful bed mobility     GAIT:   Comments: slow, increased  bil circumduction   POSTURE: rounded shoulders, forward head,  decreased thoracic kyphosis, anterior pelvic tilt, and Rt thoracic/lumbar curvature   LUMBARAROM/PROM:   A/PROM A/PROM  Eval (%)  Flexion 75, pain in anteiror pelvis  Extension 50  Right lateral flexion 75  Left lateral flexion 75, pain over Lt SIJ  Right rotation WNL  Left rotation WNL   (Blank rows = not tested)       LOWER EXTREMITY MMT: Grossly 4-/5     PALPATION:   General  Tenderness over bil SIJ                 External Perineal Exam NA                             Internal Pelvic Floor NA   Patient confirms identification and approves PT to assess internal pelvic floor and treatment Yes No need this session     TODAY'S TREATMENT 06/20/22:  Manual: Soft tissue mobilization: Scar tissue mobilization: Myofascial release: Spinal mobilization: Internal pelvic floor techniques: Dry needling: Neuromuscular re-education: Core retraining:  Core facilitation: Form correction: Pelvic floor contraction training: Down training: Exercises: Stretches/mobility: Strengthening: Therapeutic activities: Functional strengthening activities: Self-care:                                                                                                  DATE: 06/02/22      EVAL  Neuromuscular re-education: Core retraining:  Transversus abdominus training with multimodal cues for improved motor control and breath coordination Core facilitation: Seated march 2 x 10 Seated hip adduction 10 x 5 seconds Seated hip abduction 10x, red band Self-care: Pelvic support belt Lactation support Bed mobility   Check all possible CPT codes: 97110- Therapeutic Exercise and 97535 - Self  Care                         Check all conditions that are expected to impact treatment: Musculoskeletal disorders and Current pregnancy or recent postpartum              If treatment provided at initial evaluation, no treatment charged due to lack of authorization.                                 PATIENT EDUCATION:  Education details: see above self-care Person educated: Patient Education method: Explanation, Demonstration, Tactile cues, Verbal cues, and Handouts Education comprehension: verbalized understanding   HOME EXERCISE PROGRAM: [email or web address]   ASSESSMENT:   CLINICAL IMPRESSION: Patient is a 26 y.o. female who was seen today for physical therapy evaluation and treatment for pain during pregnancy. Exam findings include decreased lumbar A/ROM, decreased hip strength, tenderness to palpation of bil SIJs, painful bed mobility, and abnormal posture. Signs and symptoms are most consistent with lumbopelvic instability due to pregnancy. Initial treatment consisted of stability exercises, bed mobility training, and education on lactation support/pelvic stability belt. She will benefit form skilled PT intervention in order to decrease pain/manage pain throughout remainder of pregnancy, begin and progress functional strengthening program, prepare for delivery, and improve QOL.    OBJECTIVE IMPAIRMENTS: decreased activity tolerance, decreased coordination, decreased endurance, decreased mobility, decreased ROM, decreased strength, increased muscle spasms, postural dysfunction, and pain.    ACTIVITY LIMITATIONS: bending, standing, squatting, transfers, bed mobility, and locomotion level   PARTICIPATION LIMITATIONS: cleaning, laundry, and community activity   PERSONAL FACTORS: 1-2 comorbidities: G2P1 (2x twins), eczema, prediabetic, hypertension, anxiety/depression  are also affecting patient's functional outcome.    REHAB POTENTIAL: Good   CLINICAL DECISION MAKING: Stable/uncomplicated   EVALUATION COMPLEXITY: Low     GOALS: Goals reviewed with patient? Yes   SHORT TERM GOALS: Target date: 06/30/2022   Pt will be independent with HEP.    Baseline: Goal status: INITIAL   2.  Pt will be independent with diaphragmatic breathing and down training activities in order to improve pain  and help activate core musculature.   Baseline:  Goal status: INITIAL       LONG TERM GOALS: Target date: 08/25/2022    Pt will be independent with advanced HEP.    Baseline:  Goal status: INITIAL   2.  Pt will increase all impaired lumbar A/ROM by 25% without pain.   Baseline:  Goal status: INITIAL   3.  Pt will report 25% reduction in worst pain level and be independent with pain management activities during remainder of pregnancy.  Baseline:  Goal status: INITIAL   4.  Pt will demonstrate increase in all impaired hip strength by 1 muscle grades in order to demonstrate improved lumbopelvic support and increase functional ability.    Baseline:  Goal status: INITIAL   5.  Pt will be independent with transfers and bed mobility with appropriate body mechanics, intra-abdominal pressure management, and no increase in pain.  Baseline:  Goal status: INITIAL     PLAN:   PT FREQUENCY: 1x/week   PT DURATION: 12 weeks   PLANNED INTERVENTIONS: Therapeutic exercises, Therapeutic activity, Neuromuscular re-education, Balance training, Gait training, Patient/Family education, Self Care, Joint mobilization, Dry Needling, Biofeedback, and Manual therapy   PLAN FOR NEXT SESSION: Review stability exercises; progress to tolerance; manual techniques for pain management as needed.  Julio Alm, PT, DPT11/19/232:52 PM

## 2022-06-20 ENCOUNTER — Ambulatory Visit: Payer: Medicaid Other

## 2022-06-22 ENCOUNTER — Encounter: Payer: Self-pay | Admitting: *Deleted

## 2022-06-22 ENCOUNTER — Ambulatory Visit: Payer: Medicaid Other | Attending: Maternal & Fetal Medicine

## 2022-06-22 ENCOUNTER — Ambulatory Visit: Payer: Medicaid Other | Admitting: *Deleted

## 2022-06-22 ENCOUNTER — Ambulatory Visit (INDEPENDENT_AMBULATORY_CARE_PROVIDER_SITE_OTHER): Payer: Medicaid Other | Admitting: Obstetrics & Gynecology

## 2022-06-22 ENCOUNTER — Other Ambulatory Visit: Payer: Self-pay

## 2022-06-22 ENCOUNTER — Encounter: Payer: Self-pay | Admitting: Obstetrics & Gynecology

## 2022-06-22 VITALS — BP 124/68 | HR 112 | Wt 344.5 lb

## 2022-06-22 VITALS — BP 111/53 | HR 114

## 2022-06-22 DIAGNOSIS — O99333 Smoking (tobacco) complicating pregnancy, third trimester: Secondary | ICD-10-CM

## 2022-06-22 DIAGNOSIS — O99212 Obesity complicating pregnancy, second trimester: Secondary | ICD-10-CM | POA: Insufficient documentation

## 2022-06-22 DIAGNOSIS — O10013 Pre-existing essential hypertension complicating pregnancy, third trimester: Secondary | ICD-10-CM

## 2022-06-22 DIAGNOSIS — O30043 Twin pregnancy, dichorionic/diamniotic, third trimester: Secondary | ICD-10-CM

## 2022-06-22 DIAGNOSIS — O0990 Supervision of high risk pregnancy, unspecified, unspecified trimester: Secondary | ICD-10-CM

## 2022-06-22 DIAGNOSIS — Z3A30 30 weeks gestation of pregnancy: Secondary | ICD-10-CM

## 2022-06-22 DIAGNOSIS — E669 Obesity, unspecified: Secondary | ICD-10-CM

## 2022-06-22 DIAGNOSIS — O10919 Unspecified pre-existing hypertension complicating pregnancy, unspecified trimester: Secondary | ICD-10-CM | POA: Insufficient documentation

## 2022-06-22 DIAGNOSIS — O30042 Twin pregnancy, dichorionic/diamniotic, second trimester: Secondary | ICD-10-CM | POA: Diagnosis present

## 2022-06-22 DIAGNOSIS — O0993 Supervision of high risk pregnancy, unspecified, third trimester: Secondary | ICD-10-CM

## 2022-06-22 DIAGNOSIS — Z87891 Personal history of nicotine dependence: Secondary | ICD-10-CM

## 2022-06-22 DIAGNOSIS — O10913 Unspecified pre-existing hypertension complicating pregnancy, third trimester: Secondary | ICD-10-CM

## 2022-06-22 DIAGNOSIS — O99213 Obesity complicating pregnancy, third trimester: Secondary | ICD-10-CM

## 2022-06-22 NOTE — Patient Instructions (Signed)
Return to office for any scheduled appointments. Call the office or go to the MAU at [facility] if: You begin to have strong, frequent contractions Your water breaks.  Sometimes it is a big gush of fluid, sometimes it is just a trickle that keeps getting your underwear wet or running down your legs You have vaginal bleeding.  It is normal to have a small amount of spotting if your cervix was checked.  You do not feel your baby moving like normal.  If you do not, get something to eat and drink and lay down and focus on feeling your baby move.   If your baby is still not moving like normal, you should call the office or go to MAU. Any other obstetric concerns.  

## 2022-06-22 NOTE — Progress Notes (Signed)
PRENATAL VISIT NOTE  Subjective:  Autumn Duarte is a 26 y.o. G2P1002 at [redacted]w[redacted]d being seen today for ongoing prenatal care.  She is currently monitored for the following issues for this high-risk pregnancy and has ECZEMA; PROBLEMS WITH HEARING; Dichorionic diamniotic twin gestation; BMI 50.0-59.9, adult (HCC); Maternal morbid obesity, antepartum (HCC); Moderate major depression (HCC); Prediabetes; Adjustment disorder with mixed anxiety and depressed mood; Chronic hypertension during pregnancy, antepartum; Supervision of high risk pregnancy, antepartum; and Mild intermittent asthma without complication on their problem list.  Patient reports no complaints.  Contractions: Irregular. Vag. Bleeding: None.  Movement: Present. Denies leaking of fluid.   The following portions of the patient's history were reviewed and updated as appropriate: allergies, current medications, past family history, past medical history, past social history, past surgical history and problem list.   Objective:   Vitals:   06/22/22 1409  BP: 124/68  Pulse: (!) 112  Weight: (!) 344 lb 8 oz (156.3 kg)    Fetal Status: Fetal Heart Rate (bpm): 140/143   Movement: Present     General:  Alert, oriented and cooperative. Patient is in no acute distress.  Skin: Skin is warm and dry. No rash noted.   Cardiovascular: Normal heart rate noted  Respiratory: Normal respiratory effort, no problems with respiration noted  Abdomen: Soft, gravid, appropriate for gestational age.  Pain/Pressure: Absent     Pelvic: Cervical exam deferred        Extremities: Normal range of motion.  Edema: None  Mental Status: Normal mood and affect. Normal behavior. Normal judgment and thought content.   Imaging: [place] MFM OB FOLLOW UP  Result Date: 05/26/2022 ----------------------------------------------------------------------  OBSTETRICS REPORT                       (Signed Final 05/26/2022 04:22 pm)  ---------------------------------------------------------------------- Patient Info  ID #:       [id number]                          D.O.B.:  [birth year] (26 yrs)  Name:       Autumn Duarte                    Visit Date: 05/26/2022 02:45 pm ---------------------------------------------------------------------- Performed By  Attending:        Lin Landsman      Ref. Address:     [address]                    MD                                                             [place], [place]                                                             [address]  Performed By:     Burt Knack RDMS     Location:         [facility]                                                             [place] [facility]                                                             [facility]  Referred By:      Aletha Halim MD ---------------------------------------------------------------------- Orders  #  Description                           Code        Ordered By  1  [place] MFM OB FOLLOW UP                   [id number]    Sander Nephew  2  [place] MFM OB FOLLOW UP ADDL              [id number]    Romero Belling ----------------------------------------------------------------------  #  Order #                     Accession #                Episode #  1  [id number]                   [id number]                 [id number]  2  [id number]                   [id number]                 [id number] ---------------------------------------------------------------------- Indications  Obesity complicating pregnancy, second         O99.212  trimester (BMI 62)  [redacted] weeks gestation of pregnancy                Z3A.26  Twin pregnancy, di/di, second trimester        O30.042  Hypertension - Chronic/Pre-existing (no        O10.019  meds)  Encounter for antenatal screening  for          Z36.3  malformations  LR NIPS (dizygotic, female/female)  Antenatal follow-up for nonvisualized fetal    Z36.2  anatomy ---------------------------------------------------------------------- Fetal Evaluation (Fetus A)  Num Of Fetuses:         2  Fetal Heart Rate(bpm):  129  Cardiac Activity:       Observed  Fetal Lie:              Maternal left side- female  Presentation:           Cephalic  Placenta:               Anterior  P. Cord Insertion:      Visualized  Amniotic Fluid  AFI FV:      Within normal limits                              Largest Pocket(cm)                              5.9 ---------------------------------------------------------------------- Biometry (Fetus A)  BPD:      70.4  mm     G. Age:  28w 2d         93  %    CI:         73.6   %    70 - 86                                                          FL/HC:      18.5   %    18.6 - 20.4  HC:      260.7  mm     G. Age:  28w 2d         88  %    HC/AC:      1.12        1.04 - 1.22  AC:       232   mm     G. Age:  27w 4d         79  %    FL/BPD:     68.6   %    71 - 87  FL:       48.3  mm     G. Age:  26w 1d         33  %    FL/AC:      20.8   %    20 - 24  LV:        4.8  mm  Est. FW:    1041  gm      2 lb 5 oz     77  %     FW Discordancy     0 \ 13 % ---------------------------------------------------------------------- OB History  Blood Type:   O+  Gravidity:    2         Term:   1        Prem:   0        SAB:   0  TOP:          0       Ectopic:  0        Living: 2 ---------------------------------------------------------------------- Gestational Age (Fetus A)  LMP:           28w 0d        Date:  11/11/21                  EDD:   08/18/22  U/S Today:     27w 4d                                        EDD:   08/21/22  Best:          26w 2d     Det. ByLoman Chroman         EDD:   08/30/22                                      (01/05/22) ---------------------------------------------------------------------- Anatomy (Fetus A)  Cranium:                Appears normal         Aortic Arch:            Appears normal  Cavum:                 Appears normal         Ductal Arch:            Not well visualized  Ventricles:            Appears normal         Diaphragm:              Appears normal  Choroid Plexus:        Appears normal         Stomach:                Appears normal, left                                                                        sided  Cerebellum:            Appears normal         Abdomen:                Appears normal  Posterior Fossa:       Appears normal         Abdominal Wall:         Appears nml (cord                                                                        insert, abd wall)  Nuchal Fold:           Previously seen        Cord Vessels:           Appears normal (3  vessel cord)  Face:                  Appears normal         Kidneys:                Appear normal                         (orbits and profile)  Lips:                  Appears normal         Bladder:                Appears normal  Thoracic:              Appears normal         Spine:                  Appears normal  Heart:                 Appears normal         Upper Extremities:      Appears normal                         (4CH, axis, and                         situs)  RVOT:                  Appears normal         Lower Extremities:      Appears normal  LVOT:                  Appears normal  Other:  Fetus appears to be a female. 3VV and 3VTV visualized. Heels/feet          and open  Lt hand /5th digits visualized. Nasal bone, lenses, maxilla,          mandible and falx visualized ---------------------------------------------------------------------- Targeted Anatomy (Fetus A)  Thorax  Lungs:                 Appears normal  Abdomen  Situs:                 Within normal limits  Extremities  Rt Hand:               Not well visualized  Other  Genitalia:             Female  ---------------------------------------------------------------------- Fetal Evaluation (Fetus B)  Num Of Fetuses:         2  Fetal Heart Rate(bpm):  155  Cardiac Activity:       Observed  Fetal Lie:              Maternal right side female  Placenta:               Posterior  P. Cord Insertion:      Visualized  Amniotic Fluid  AFI FV:      Within normal limits                              Largest Pocket(cm)  6.1 ---------------------------------------------------------------------- Biometry (Fetus B)  BPD:      61.8  mm     G. Age:  25w 1d          9  %    CI:        69.57   %    70 - 86                                                          FL/HC:      20.0   %    18.6 - 20.4  HC:      236.5  mm     G. Age:  25w 5d         11  %    HC/AC:      1.06        1.04 - 1.22  AC:      223.7  mm     G. Age:  26w 6d         57  %    FL/BPD:     76.4   %    71 - 87  FL:       47.2  mm     G. Age:  25w 5d         21  %    FL/AC:      21.1   %    20 - 24  LV:        3.6  mm  Est. FW:     907  gm           2 lb     35  %     FW Discordancy        13  % ---------------------------------------------------------------------- Gestational Age (Fetus B)  LMP:           28w 0d        Date:  11/11/21                  EDD:   08/18/22  U/S Today:     25w 6d                                        EDD:   09/02/22  Best:          26w 2d     Det. By:  Loman Chroman         EDD:   08/30/22                                      (01/05/22) ---------------------------------------------------------------------- Anatomy (Fetus B)  Cranium:               Appears normal         Aortic Arch:            Not well visualized  Cavum:                 Appears normal         Ductal Arch:            Not well visualized  Ventricles:            Appears normal         Diaphragm:              Appears normal  Choroid Plexus:        Appears normal         Stomach:                Appears normal, left                                                                         sided  Cerebellum:            Appears normal         Abdomen:                Appears normal  Posterior Fossa:       Appears normal         Abdominal Wall:         Appears nml (cord                                                                        insert, abd wall)  Nuchal Fold:           Previously seen        Cord Vessels:           Appears normal (3                                                                        vessel cord)  Face:                  Appears normal         Kidneys:                Appear normal                         (orbits and profile)  Lips:                  Appears normal         Bladder:                Appears normal  Thoracic:              Appears normal         Spine:                  Appears normal  Heart:                 Appears normal  Upper Extremities:      Appears normal                         (4CH, axis, and                         situs)  RVOT:                  Not well visualized    Lower Extremities:      Appears normal  LVOT:                  Not well visualized  Other:  Fetus appears to be female. Heels/feet and open hand rt /5th digits          visualized. Nasal bone, lenses, maxilla, mandible and falx visualized          VC, 3VV and 3VTV visualized. ---------------------------------------------------------------------- Targeted Anatomy (Fetus B)  Abdomen  Situs:                 Within normal limits  Extremities  Lt Hand:               Not well visualized ---------------------------------------------------------------------- Impression  Follow up growth of diamniotic dichorionc twin pregnancy  No interval changes to her prenatal care were noted.  Normal interval growth for twin A and B with a twin  discordance of 13%.  Twin A EFW 77% with good fetal movement and amniotic  fluid volume  Twin B EFW 35% with good fetal movement and amniotic  fluid volume  Suboptimal views of the fetal anatomy was observed  secondary to fetal  position. ---------------------------------------------------------------------- Recommendations  Follow up growth in 4 weeks. ----------------------------------------------------------------------               Sander Nephew, MD Electronically Signed Final Report   05/26/2022 04:22 pm ----------------------------------------------------------------------  [place] MFM OB FOLLOW UP ADDL GEST  Result Date: 05/26/2022 ----------------------------------------------------------------------  OBSTETRICS REPORT                       (Signed Final 05/26/2022 04:22 pm) ---------------------------------------------------------------------- Patient Info  ID #:       [id number]                          D.O.B.:  [birth year] (26 yrs)  Name:       ANTHONETTE ZIVKOVIC Pribble                    Visit Date: 05/26/2022 02:45 pm ---------------------------------------------------------------------- Performed By  Attending:        Sander Nephew      Ref. Address:     [address], [place]  [id number]  Performed By:     Rolm Bookbinder RDMS     Location[facility]         [facility]                                                             [place] [facility]                                                             [facility]  Referred By:      Aletha Halim MD ---------------------------------------------------------------------- Orders  #  Description                           Code        Ordered By  1  [place] MFM OB FOLLOW UP                   [id number]    Sander Nephew  2  [place] MFM OB FOLLOW UP ADDL              [id number]    Romero Belling ----------------------------------------------------------------------  #   Order #                     Accession #                Episode #  1  [id number]                   [id number]                 [id number]  2  [id number]                   [id number]                 [id number] ---------------------------------------------------------------------- Indications  Obesity complicating pregnancy, second         O99.212  trimester (BMI 10)  [redacted] weeks gestation of  pregnancy                Z3A.26  Twin pregnancy, di/di, second trimester        O30.042  Hypertension - Chronic/Pre-existing (no        O10.019  meds)  Encounter for antenatal screening for          Z36.3  malformations  LR NIPS (dizygotic, female/female)  Antenatal follow-up for nonvisualized fetal    Z36.2  anatomy ---------------------------------------------------------------------- Fetal Evaluation (Fetus A)  Num Of Fetuses:         2  Fetal Heart Rate(bpm):  129  Cardiac Activity:       Observed  Fetal Lie:              Maternal left side- female  Presentation:           Cephalic  Placenta:               Anterior  P. Cord Insertion:      Visualized  Amniotic Fluid  AFI FV:      Within normal limits                              Largest Pocket(cm)                              5.9 ---------------------------------------------------------------------- Biometry (Fetus A)  BPD:      70.4  mm     G. Age:  28w 2d         93  %    CI:         73.6   %    70 - 86                                                          FL/HC:      18.5   %    18.6 - 20.4  HC:      260.7  mm     G. Age:  28w 2d         88  %    HC/AC:      1.12        1.04 - 1.22  AC:       232   mm     G. Age:  27w 4d         79  %    FL/BPD:     68.6   %    71 - 87  FL:       48.3  mm     G. Age:  26w 1d         33  %    FL/AC:      20.8   %    20 - 24  LV:        4.8  mm  Est. FW:    1041  gm      2 lb 5 oz     77  %     FW Discordancy     0 \ 13 % ---------------------------------------------------------------------- OB History  Blood Type:   O+  Gravidity:    2  Term:    1        Prem:   0        SAB:   0  TOP:          0       Ectopic:  0        Living: 2 ---------------------------------------------------------------------- Gestational Age (Fetus A)  LMP:           28w 0d        Date:  11/11/21                  EDD:   08/18/22  U/S Today:     27w 4d                                        EDD:   08/21/22  Best:          26w 2d     Det. ByLoman Chroman         EDD:   08/30/22                                      (01/05/22) ---------------------------------------------------------------------- Anatomy (Fetus A)  Cranium:               Appears normal         Aortic Arch:            Appears normal  Cavum:                 Appears normal         Ductal Arch:            Not well visualized  Ventricles:            Appears normal         Diaphragm:              Appears normal  Choroid Plexus:        Appears normal         Stomach:                Appears normal, left                                                                        sided  Cerebellum:            Appears normal         Abdomen:                Appears normal  Posterior Fossa:       Appears normal         Abdominal Wall:         Appears nml (cord  insert, abd wall)  Nuchal Fold:           Previously seen        Cord Vessels:           Appears normal (3                                                                        vessel cord)  Face:                  Appears normal         Kidneys:                Appear normal                         (orbits and profile)  Lips:                  Appears normal         Bladder:                Appears normal  Thoracic:              Appears normal         Spine:                  Appears normal  Heart:                 Appears normal         Upper Extremities:      Appears normal                         (4CH, axis, and                         situs)  RVOT:                  Appears normal         Lower Extremities:       Appears normal  LVOT:                  Appears normal  Other:  Fetus appears to be a female. 3VV and 3VTV visualized. Heels/feet          and open  Lt hand /5th digits visualized. Nasal bone, lenses, maxilla,          mandible and falx visualized ---------------------------------------------------------------------- Targeted Anatomy (Fetus A)  Thorax  Lungs:                 Appears normal  Abdomen  Situs:                 Within normal limits  Extremities  Rt Hand:               Not well visualized  Other  Genitalia:             Female ---------------------------------------------------------------------- Fetal Evaluation (Fetus B)  Num Of Fetuses:         2  Fetal Heart Rate(bpm):  155  Cardiac Activity:       Observed  Fetal Lie:              Maternal right side female  Placenta:               Posterior  P. Cord Insertion:      Visualized  Amniotic Fluid  AFI FV:      Within normal limits                              Largest Pocket(cm)                              6.1 ---------------------------------------------------------------------- Biometry (Fetus B)  BPD:      61.8  mm     G. Age:  25w 1d          9  %    CI:        69.57   %    70 - 86                                                          FL/HC:      20.0   %    18.6 - 20.4  HC:      236.5  mm     G. Age:  25w 5d         11  %    HC/AC:      1.06        1.04 - 1.22  AC:      223.7  mm     G. Age:  26w 6d         57  %    FL/BPD:     76.4   %    71 - 87  FL:       47.2  mm     G. Age:  25w 5d         21  %    FL/AC:      21.1   %    20 - 24  LV:        3.6  mm  Est. FW:     907  gm           2 lb     35  %     FW Discordancy        13  % ---------------------------------------------------------------------- Gestational Age (Fetus B)  LMP:           28w 0d        Date:  11/11/21                  EDD:   08/18/22  U/S Today:     25w 6d                                        EDD:   09/02/22  Best:          26w 2d     Det. ByLoman Chroman         EDD:    08/30/22                                      (  01/05/22) ---------------------------------------------------------------------- Anatomy (Fetus B)  Cranium:               Appears normal         Aortic Arch:            Not well visualized  Cavum:                 Appears normal         Ductal Arch:            Not well visualized  Ventricles:            Appears normal         Diaphragm:              Appears normal  Choroid Plexus:        Appears normal         Stomach:                Appears normal, left                                                                        sided  Cerebellum:            Appears normal         Abdomen:                Appears normal  Posterior Fossa:       Appears normal         Abdominal Wall:         Appears nml (cord                                                                        insert, abd wall)  Nuchal Fold:           Previously seen        Cord Vessels:           Appears normal (3                                                                        vessel cord)  Face:                  Appears normal         Kidneys:                Appear normal                         (orbits and profile)  Lips:                  Appears normal  Bladder:                Appears normal  Thoracic:              Appears normal         Spine:                  Appears normal  Heart:                 Appears normal         Upper Extremities:      Appears normal                         (4CH, axis, and                         situs)  RVOT:                  Not well visualized    Lower Extremities:      Appears normal  LVOT:                  Not well visualized  Other:  Fetus appears to be female. Heels/feet and open hand rt /5th digits          visualized. Nasal bone, lenses, maxilla, mandible and falx visualized          VC, 3VV and 3VTV visualized. ---------------------------------------------------------------------- Targeted Anatomy (Fetus B)  Abdomen  Situs:                 Within normal  limits  Extremities  Lt Hand:               Not well visualized ---------------------------------------------------------------------- Impression  Follow up growth of diamniotic dichorionc twin pregnancy  No interval changes to her prenatal care were noted.  Normal interval growth for twin A and B with a twin  discordance of 13%.  Twin A EFW 77% with good fetal movement and amniotic  fluid volume  Twin B EFW 35% with good fetal movement and amniotic  fluid volume  Suboptimal views of the fetal anatomy was observed  secondary to fetal position. ---------------------------------------------------------------------- Recommendations  Follow up growth in 4 weeks. ----------------------------------------------------------------------               Sander Nephew, MD Electronically Signed Final Report   05/26/2022 04:22 pm ----------------------------------------------------------------------   Assessment and Plan:  Pregnancy: [id number] at [redacted]w[redacted]d 1. Dichorionic diamniotic twin pregnancy in third trimester Concordant growth.  Continue antenatal testing and ultrasounds as per MFM and follow MFM recommendations.   2. Chronic hypertension during pregnancy, antepartum Stable BP, no medications.  Continue antenatal testing and ultrasounds as per MFM and follow MFM recommendations.   3. Maternal morbid obesity, antepartum (HCC) TWG 39 lbs, BMI 59  4. [redacted] weeks gestation of pregnancy 5. Supervision of high risk pregnancy, antepartum On oral iron therapy for mild anemia. No other concerns. Normal 2 hr GTT. Preterm labor symptoms and general obstetric precautions including but not limited to vaginal bleeding, contractions, leaking of fluid and fetal movement were reviewed in detail with the patient. Please refer to After Visit Summary for other counseling recommendations.   Return in about 2 weeks (around 07/06/2022) for OFFICE OB VISIT (MD only).  Future Appointments  Date Time Provider [place]   06/22/2022  2:45 PM [facility] US5 [facility]  07/08/2022 10:55 AM Griffin Basil,  MD [facility]  07/14/2022  2:30 PM [facility] NURSE [facility]  07/14/2022  2:45 PM [facility] US4 [facility]  07/18/2022 12:30 PM Heather Roberts A, PT [facility] None  07/21/2022 10:15 AM Griffin Basil, MD [facility]  07/22/2022  2:30 PM [facility] NURSE [facility]  07/22/2022  2:45 PM [facility] US5 [facility]  08/02/2022  8:00 AM Heather Roberts A, PT [facility] None  08/09/2022  8:00 AM Ste[place], [place], PT [facility] None    Verita Schneiders, MD

## 2022-07-01 ENCOUNTER — Encounter: Payer: Self-pay | Admitting: Obstetrics and Gynecology

## 2022-07-06 ENCOUNTER — Other Ambulatory Visit: Payer: Self-pay | Admitting: Advanced Practice Midwife

## 2022-07-06 ENCOUNTER — Other Ambulatory Visit: Payer: Self-pay

## 2022-07-07 ENCOUNTER — Inpatient Hospital Stay (HOSPITAL_COMMUNITY)
Admission: AD | Admit: 2022-07-07 | Discharge: 2022-07-20 | DRG: 768 | Disposition: A | Discharge status: Home / Self Care | Payer: Medicaid Other | Attending: Obstetrics & Gynecology | Admitting: Obstetrics & Gynecology

## 2022-07-07 ENCOUNTER — Other Ambulatory Visit: Payer: Self-pay

## 2022-07-07 ENCOUNTER — Encounter (HOSPITAL_COMMUNITY): Payer: Self-pay | Admitting: Obstetrics and Gynecology

## 2022-07-07 DIAGNOSIS — Z7982 Long term (current) use of aspirin: Secondary | ICD-10-CM

## 2022-07-07 DIAGNOSIS — O0990 Supervision of high risk pregnancy, unspecified, unspecified trimester: Secondary | ICD-10-CM

## 2022-07-07 DIAGNOSIS — O10013 Pre-existing essential hypertension complicating pregnancy, third trimester: Secondary | ICD-10-CM | POA: Diagnosis not present

## 2022-07-07 DIAGNOSIS — O1002 Pre-existing essential hypertension complicating childbirth: Secondary | ICD-10-CM | POA: Diagnosis present

## 2022-07-07 DIAGNOSIS — O99214 Obesity complicating childbirth: Secondary | ICD-10-CM | POA: Diagnosis present

## 2022-07-07 DIAGNOSIS — O99210 Obesity complicating pregnancy, unspecified trimester: Secondary | ICD-10-CM | POA: Diagnosis present

## 2022-07-07 DIAGNOSIS — O99213 Obesity complicating pregnancy, third trimester: Secondary | ICD-10-CM | POA: Diagnosis not present

## 2022-07-07 DIAGNOSIS — O10919 Unspecified pre-existing hypertension complicating pregnancy, unspecified trimester: Secondary | ICD-10-CM | POA: Diagnosis not present

## 2022-07-07 DIAGNOSIS — O9081 Anemia of the puerperium: Secondary | ICD-10-CM | POA: Diagnosis not present

## 2022-07-07 DIAGNOSIS — Z23 Encounter for immunization: Secondary | ICD-10-CM | POA: Diagnosis not present

## 2022-07-07 DIAGNOSIS — E669 Obesity, unspecified: Secondary | ICD-10-CM | POA: Diagnosis not present

## 2022-07-07 DIAGNOSIS — O4290 Premature rupture of membranes, unspecified as to length of time between rupture and onset of labor, unspecified weeks of gestation: Secondary | ICD-10-CM | POA: Diagnosis present

## 2022-07-07 DIAGNOSIS — O30049 Twin pregnancy, dichorionic/diamniotic, unspecified trimester: Secondary | ICD-10-CM | POA: Diagnosis present

## 2022-07-07 DIAGNOSIS — O42919 Preterm premature rupture of membranes, unspecified as to length of time between rupture and onset of labor, unspecified trimester: Secondary | ICD-10-CM

## 2022-07-07 DIAGNOSIS — O09899 Supervision of other high risk pregnancies, unspecified trimester: Secondary | ICD-10-CM | POA: Diagnosis present

## 2022-07-07 DIAGNOSIS — Z3A33 33 weeks gestation of pregnancy: Secondary | ICD-10-CM

## 2022-07-07 DIAGNOSIS — O30043 Twin pregnancy, dichorionic/diamniotic, third trimester: Secondary | ICD-10-CM | POA: Diagnosis present

## 2022-07-07 DIAGNOSIS — D62 Acute posthemorrhagic anemia: Secondary | ICD-10-CM | POA: Diagnosis present

## 2022-07-07 DIAGNOSIS — Z8751 Personal history of pre-term labor: Secondary | ICD-10-CM | POA: Diagnosis present

## 2022-07-07 DIAGNOSIS — Z3A32 32 weeks gestation of pregnancy: Secondary | ICD-10-CM | POA: Diagnosis not present

## 2022-07-07 DIAGNOSIS — O42913 Preterm premature rupture of membranes, unspecified as to length of time between rupture and onset of labor, third trimester: Principal | ICD-10-CM | POA: Diagnosis present

## 2022-07-07 DIAGNOSIS — O26893 Other specified pregnancy related conditions, third trimester: Secondary | ICD-10-CM | POA: Diagnosis present

## 2022-07-07 DIAGNOSIS — Z8759 Personal history of other complications of pregnancy, childbirth and the puerperium: Secondary | ICD-10-CM | POA: Diagnosis present

## 2022-07-07 DIAGNOSIS — Z87891 Personal history of nicotine dependence: Secondary | ICD-10-CM | POA: Diagnosis not present

## 2022-07-07 DIAGNOSIS — O42013 Preterm premature rupture of membranes, onset of labor within 24 hours of rupture, third trimester: Secondary | ICD-10-CM | POA: Diagnosis not present

## 2022-07-07 DIAGNOSIS — O42113 Preterm premature rupture of membranes, onset of labor more than 24 hours following rupture, third trimester: Secondary | ICD-10-CM | POA: Diagnosis not present

## 2022-07-07 HISTORY — DX: Essential (primary) hypertension: I10

## 2022-07-07 LAB — POCT FERN TEST: POCT Fern Test: POSITIVE

## 2022-07-07 LAB — CBC
HCT: 28 % — ABNORMAL LOW (ref 36.0–46.0)
Hemoglobin: 9.2 g/dL — ABNORMAL LOW (ref 12.0–15.0)
MCH: 28.6 pg (ref 26.0–34.0)
MCHC: 32.9 g/dL (ref 30.0–36.0)
MCV: 87 fL (ref 80.0–100.0)
Platelets: 266 10*3/uL (ref 150–400)
RBC: 3.22 MIL/uL — ABNORMAL LOW (ref 3.87–5.11)
RDW: 14.1 % (ref 11.5–15.5)
WBC: 9.6 10*3/uL (ref 4.0–10.5)
nRBC: 0 % (ref 0.0–0.2)

## 2022-07-07 LAB — TYPE AND SCREEN
ABO/RH(D): O POS
Antibody Screen: NEGATIVE

## 2022-07-07 MED ORDER — CALCIUM CARBONATE ANTACID 500 MG PO CHEW
2.0000 | CHEWABLE_TABLET | ORAL | Status: DC | PRN
  Administered 2022-07-08: 400 mg via ORAL
  Filled 2022-07-07: qty 2

## 2022-07-07 MED ORDER — AMOXICILLIN 500 MG PO CAPS
500.0000 mg | ORAL_CAPSULE | Freq: Three times a day (TID) | ORAL | Status: AC
  Administered 2022-07-09 – 2022-07-13 (×15): 500 mg via ORAL
  Filled 2022-07-07 (×15): qty 1

## 2022-07-07 MED ORDER — BETAMETHASONE SOD PHOS & ACET 6 (3-3) MG/ML IJ SUSP
12.0000 mg | INTRAMUSCULAR | Status: AC
  Administered 2022-07-07 – 2022-07-08 (×2): 12 mg via INTRAMUSCULAR
  Filled 2022-07-07: qty 5

## 2022-07-07 MED ORDER — LACTATED RINGERS IV SOLN: 125.0000 mL/h | INTRAVENOUS | Status: AC

## 2022-07-07 MED ORDER — PRENATAL MULTIVITAMIN CH
1.0000 | ORAL_TABLET | Freq: Every day | ORAL | Status: DC
  Administered 2022-07-07 – 2022-07-17 (×11): 1 via ORAL
  Filled 2022-07-07 (×12): qty 1

## 2022-07-07 MED ORDER — DOCUSATE SODIUM 100 MG PO CAPS
100.0000 mg | ORAL_CAPSULE | Freq: Every day | ORAL | Status: DC
  Administered 2022-07-07 – 2022-07-17 (×11): 100 mg via ORAL
  Filled 2022-07-07 (×11): qty 1

## 2022-07-07 MED ORDER — LACTATED RINGERS IV SOLN
INTRAVENOUS | Status: DC
  Administered 2022-07-07: 100 mL/h via INTRAVENOUS

## 2022-07-07 MED ORDER — AZITHROMYCIN 250 MG PO TABS
1000.0000 mg | ORAL_TABLET | Freq: Once | ORAL | Status: AC
  Administered 2022-07-07: 1000 mg via ORAL
  Filled 2022-07-07: qty 4

## 2022-07-07 MED ORDER — SODIUM CHLORIDE 0.9 % IV SOLN
2.0000 g | Freq: Four times a day (QID) | INTRAVENOUS | Status: AC
  Administered 2022-07-07 – 2022-07-08 (×8): 2 g via INTRAVENOUS
  Filled 2022-07-07 (×8): qty 2000

## 2022-07-07 MED ORDER — ORAL CARE MOUTH RINSE: 15.0000 mL | OROMUCOSAL | Status: DC | PRN

## 2022-07-07 MED ORDER — ACETAMINOPHEN 325 MG PO TABS
650.0000 mg | ORAL_TABLET | ORAL | Status: DC | PRN
  Administered 2022-07-07 – 2022-07-17 (×8): 650 mg via ORAL
  Filled 2022-07-07 (×8): qty 2

## 2022-07-07 MED ORDER — NIFEDIPINE 10 MG PO CAPS
10.0000 mg | ORAL_CAPSULE | ORAL | Status: AC | PRN
  Administered 2022-07-07 – 2022-07-17 (×4): 10 mg via ORAL
  Filled 2022-07-07 (×4): qty 1

## 2022-07-07 NOTE — MAU Provider Note (Addendum)
Chief Complaint:  Rupture of Membranes   Event Date/Time   First Provider Initiated Contact with Patient 07/07/22 0244     HPI: Autumn Duarte is a 26 y.o. G2P1002 at 63w2dwho presents to maternity admissions reporting leaking of water since 0125.  Having some mild contractions.  She reports good fetal movement, denies vaginal bleeding, vaginal itching/burning, urinary symptoms, h/a, dizziness, n/v, diarrhea, constipation or fever/chills.   Vaginal Discharge The patient's primary symptoms include pelvic pain and vaginal discharge. The patient's pertinent negatives include no genital itching, genital odor or vaginal bleeding. This is a new problem. The current episode started today. The problem occurs constantly. The problem has been unchanged. Pertinent negatives include no chills, diarrhea, frequency or nausea. The vaginal discharge was clear and watery. There has been no bleeding. She has not been passing clots. She has not been passing tissue. Nothing aggravates the symptoms. She has tried nothing for the symptoms.    Past Medical History: Past Medical History:  Diagnosis Date   Asthma    last used inhaler 2 months ago   Bronchitis    Eczema    Obesity    Seasonal allergies     Past obstetric history: OB History  Gravida Para Term Preterm AB Living  2 1 1     2   SAB IAB Ectopic Multiple Live Births        1 2    # Outcome Date GA Lbr Len/2nd Weight Sex Delivery Anes PTL Lv  2 Current           1A Term 12/04/18 [redacted]w[redacted]d 11:32 / 01:20 2764 g F Vag-Spont EPI  LIV     Birth Comments: WDL  1B Term 12/04/18 [redacted]w[redacted]d 11:32 / 01:40 2554 g F Vag-Spont EPI  LIV     Birth Comments: WDL    Past Surgical History: Past Surgical History:  Procedure Laterality Date   WISDOM TOOTH EXTRACTION      Family History: Family History  Problem Relation Age of Onset   Arthritis Mother    Hepatitis Mother    Thyroid disease Mother    Diabetes Father    Heart disease Father    Hypertension Father     Breast cancer Maternal Aunt    Breast cancer Maternal Grandmother     Social History: Social History   Tobacco Use   Smoking status: Former    Types: Cigarettes    Quit date: 03/19/2018    Years since quitting: 4.3   Smokeless tobacco: Never  Vaping Use   Vaping Use: Never used  Substance Use Topics   Alcohol use: Not Currently   Drug use: Never    Allergies:  Allergies  Allergen Reactions   Shellfish Allergy Itching, Shortness Of Breath and Hives    Meds:  Medications Prior to Admission  Medication Sig Dispense Refill Last Dose   aspirin EC 81 MG tablet Take 2 tablets (162 mg total) by mouth daily. Swallow whole. 60 tablet 5 07/06/2022   cyclobenzaprine (FLEXERIL) 10 MG tablet Take 1 tablet (10 mg total) by mouth 2 (two) times daily as needed for muscle spasms. 20 tablet 0 07/06/2022   docusate sodium (COLACE) 100 MG capsule Take 1 capsule (100 mg total) by mouth 2 (two) times daily. 30 capsule 2 07/06/2022   ferrous sulfate 325 (65 FE) MG tablet Take 1 tablet (325 mg total) by mouth every other day. 90 tablet 3 07/06/2022   metoCLOPramide (REGLAN) 10 MG tablet Take 1 tablet (10 mg  total) by mouth every 6 (six) hours as needed for nausea or vomiting (or headache). 20 tablet 0 07/06/2022   prenatal vitamin w/FE, FA (PRENATAL 1 + 1) 27-1 MG TABS tablet Take 1 tablet by mouth daily at 12 noon. 30 tablet 11 07/06/2022   PROMETHAZINE HCL PO Take by mouth daily.   07/06/2022   Blood Pressure Monitoring DEVI 1 each by Does not apply route once a week. 1 each 0    PROAIR HFA 108 (90 Base) MCG/ACT inhaler Inhale into the lungs. (Patient not taking: Reported on 06/22/2022)       I have reviewed patient's Past Medical Hx, Surgical Hx, Family Hx, Social Hx, medications and allergies.   ROS:  Review of Systems  Constitutional:  Negative for chills.  Gastrointestinal:  Negative for diarrhea and nausea.  Genitourinary:  Positive for pelvic pain and vaginal discharge. Negative for  frequency.   Other systems negative  Physical Exam  Patient Vitals for the past 24 hrs:  BP Temp Temp src Pulse Resp SpO2 Height Weight  07/07/22 0200 134/70 98.5 F (36.9 C) Oral (!) 120 17 99 % 5\' 4"  (1.626 m) (!) 156.9 kg   Constitutional: Well-developed, well-nourished female in no acute distress.  Cardiovascular: normal rate and rhythm Respiratory: normal effort GI: Abd soft, non-tender, gravid appropriate for gestational age.   No rebound or guarding. MS: Extremities nontender, no edema, normal ROM Neurologic: Alert and oriented x 4.  GU: Neg CVAT.  PELVIC EXAM: grossly ruptured clear fluid, cervix exam deferred   Ferning Positive  FHT:  Baseline 150/155 , moderate variability, accelerations present, no decelerations Contractions: q 2 mins Irregular, mild   Labs: No results found for this or any previous visit (from the past 24 hour(s)). O/Positive/-- (07/14 1116)  Imaging:  Pt informed that the ultrasound is considered a limited OB ultrasound and is not intended to be a complete ultrasound exam.  Patient also informed that the ultrasound is not being completed with the intent of assessing for fetal or placental anomalies or any pelvic abnormalities.  Explained that the purpose of today's ultrasound is to assess for presentation, BPP and amniotic fluid volume.  Patient acknowledges the purpose of the exam and the limitations of the study.    Bedside US done.  Both twins vertex  MAU Course/MDM: I have reviewed the triage vital signs and the nursing notes.   Pertinent labs & imaging results that were available during my care of the patient were reviewed by me and considered in my medical decision making (see chart for details).      I have reviewed her medical records including past results, notes and treatments.   I have ordered labs and reviewed results.  NST reviewed Consult Dr Elgie Congo with presentation, exam findings and test results.  Treatments in MAU included EFM, [place].     Assessment: Di-Di Twin Gestation at [redacted]w[redacted]d Preterm Premature Rupture of Membranes Chronic Hypertension in pregnancy, well controlled  Plan: Admit to [facility] Routine orders Betamethasone Latency Antbiotics MD to follow  Hansel Feinstein CNM, MSN Certified Nurse-Midwife 07/07/2022 2:44 AM

## 2022-07-07 NOTE — H&P (Addendum)
Chief Complaint:  Rupture of Membranes   Event Date/Time   First Provider Initiated Contact with Patient 07/07/22 0244     HPI: Autumn Duarte is a 26 y.o. G2P1002 at 8w2dwho presents to maternity admissions reporting leaking of water since 0125.  Having some mild contractions.  She reports good fetal movement, denies vaginal bleeding, vaginal itching/burning, urinary symptoms, h/a, dizziness, n/v, diarrhea, constipation or fever/chills.   Vaginal Discharge The patient's primary symptoms include pelvic pain and vaginal discharge. The patient's pertinent negatives include no genital itching, genital odor or vaginal bleeding. This is a new problem. The current episode started today. The problem occurs constantly. The problem has been unchanged. Pertinent negatives include no chills, diarrhea, frequency or nausea. The vaginal discharge was clear and watery. There has been no bleeding. She has not been passing clots. She has not been passing tissue. Nothing aggravates the symptoms. She has tried nothing for the symptoms.    Past Medical History: Past Medical History:  Diagnosis Date   Asthma    last used inhaler 2 months ago   Bronchitis    Eczema    Obesity    Seasonal allergies     Past obstetric history: OB History  Gravida Para Term Preterm AB Living  2 1 1     2   SAB IAB Ectopic Multiple Live Births        1 2    # Outcome Date GA Lbr Len/2nd Weight Sex Delivery Anes PTL Lv  2 Current           1A Term 12/04/18 [redacted]w[redacted]d 11:32 / 01:20 2764 g F Vag-Spont EPI  LIV     Birth Comments: WDL  1B Term 12/04/18 [redacted]w[redacted]d 11:32 / 01:40 2554 g F Vag-Spont EPI  LIV     Birth Comments: WDL    Past Surgical History: Past Surgical History:  Procedure Laterality Date   WISDOM TOOTH EXTRACTION      Family History: Family History  Problem Relation Age of Onset   Arthritis Mother    Hepatitis Mother    Thyroid disease Mother    Diabetes Father    Heart disease Father    Hypertension Father     Breast cancer Maternal Aunt    Breast cancer Maternal Grandmother     Social History: Social History   Tobacco Use   Smoking status: Former    Types: Cigarettes    Quit date: 03/19/2018    Years since quitting: 4.3   Smokeless tobacco: Never  Vaping Use   Vaping Use: Never used  Substance Use Topics   Alcohol use: Not Currently   Drug use: Never    Allergies:  Allergies  Allergen Reactions   Shellfish Allergy Itching, Shortness Of Breath and Hives    Meds:  Medications Prior to Admission  Medication Sig Dispense Refill Last Dose   aspirin EC 81 MG tablet Take 2 tablets (162 mg total) by mouth daily. Swallow whole. 60 tablet 5 07/06/2022   cyclobenzaprine (FLEXERIL) 10 MG tablet Take 1 tablet (10 mg total) by mouth 2 (two) times daily as needed for muscle spasms. 20 tablet 0 07/06/2022   docusate sodium (COLACE) 100 MG capsule Take 1 capsule (100 mg total) by mouth 2 (two) times daily. 30 capsule 2 07/06/2022   ferrous sulfate 325 (65 FE) MG tablet Take 1 tablet (325 mg total) by mouth every other day. 90 tablet 3 07/06/2022   metoCLOPramide (REGLAN) 10 MG tablet Take 1 tablet (10 mg  total) by mouth every 6 (six) hours as needed for nausea or vomiting (or headache). 20 tablet 0 07/06/2022   prenatal vitamin w/FE, FA (PRENATAL 1 + 1) 27-1 MG TABS tablet Take 1 tablet by mouth daily at 12 noon. 30 tablet 11 07/06/2022   PROMETHAZINE HCL PO Take by mouth daily.   07/06/2022   Blood Pressure Monitoring DEVI 1 each by Does not apply route once a week. 1 each 0    PROAIR HFA 108 (90 Base) MCG/ACT inhaler Inhale into the lungs. (Patient not taking: Reported on 06/22/2022)       I have reviewed patient's Past Medical Hx, Surgical Hx, Family Hx, Social Hx, medications and allergies.   ROS:  Review of Systems  Constitutional:  Negative for chills.  Gastrointestinal:  Negative for diarrhea and nausea.  Genitourinary:  Positive for pelvic pain and vaginal discharge. Negative for  frequency.   Other systems negative  Physical Exam  Patient Vitals for the past 24 hrs:  BP Temp Temp src Pulse Resp SpO2 Height Weight  07/07/22 0200 134/70 98.5 F (36.9 C) Oral (!) 120 17 99 % 5\' 4"  (1.626 m) (!) 156.9 kg   Constitutional: Well-developed, well-nourished female in no acute distress.  Cardiovascular: normal rate and rhythm Respiratory: normal effort GI: Abd soft, non-tender, gravid appropriate for gestational age.   No rebound or guarding. MS: Extremities nontender, no edema, normal ROM Neurologic: Alert and oriented x 4.  GU: Neg CVAT.  PELVIC EXAM: grossly ruptured clear fluid, cervix exam deferred   Ferning positive.  FHT:  Baseline 150/155 , moderate variability, accelerations present, no decelerations Contractions: q 2 mins Irregular, mild   Labs: No results found for this or any previous visit (from the past 24 hour(s)). O/Positive/-- (07/14 1116)  Imaging:  Pt informed that the ultrasound is considered a limited OB ultrasound and is not intended to be a complete ultrasound exam.  Patient also informed that the ultrasound is not being completed with the intent of assessing for fetal or placental anomalies or any pelvic abnormalities.  Explained that the purpose of today's ultrasound is to assess for presentation, BPP and amniotic fluid volume.  Patient acknowledges the purpose of the exam and the limitations of the study.    Bedside US done.  Both twins vertex  MAU Course/MDM: I have reviewed the triage vital signs and the nursing notes.   Pertinent labs & imaging results that were available during my care of the patient were reviewed by me and considered in my medical decision making (see chart for details).      I have reviewed her medical records including past results, notes and treatments.   I have ordered labs and reviewed results.  NST reviewed Consult Dr Elgie Congo with presentation, exam findings and test results.  Treatments in MAU included EFM, [place].     Assessment: Di-Di Twin Gestation at [redacted]w[redacted]d Preterm Premature Rupture of Membranes Chronic Hypertension in pregnancy, well controlled  Plan: Admit to [facility] Routine orders Betamethasone Latency Antbiotics MD to follow  Hansel Feinstein CNM, MSN Certified Nurse-Midwife 07/07/2022 2:44 AM

## 2022-07-07 NOTE — Consult Note (Signed)
Neonatology Consult  Note:  At the request of the patients obstetrician Dr. Elgie Congo I met with Autumn Duarte who is a 26 y.o. G2P1002 at 32w2dwho presented with rupture of membranes.  Pregnancy complicated by di-di twin gestation, chronic HTN, and PPROM.  [facility] managed with Betamethasone and Latency Antibiotics.  We reviewed initial delivery room management, including CPAP, [place], and low but certainly possible need for intubation for surfactant administration.  We discussed feeding immaturity and need for full po intake with multiple days of good weight gain and no apnea or bradycardia before discharge.  We reviewed increased risk of jaundice, infection, and temperature instability.   Discussed likely length of stay.  Thank you for allowing [place] participate in her care.  Please call with questions.  BHiginio Roger DO  Neonatologist  The total length of face-to-face or floor / unit time for this encounter was 35 minutes.  Counseling and / or coordination of care was greater than fifty percent of the time.

## 2022-07-07 NOTE — MAU Note (Signed)
.  Autumn Duarte is a 26 y.o. at [redacted]w[redacted]d here in MAU reporting:   Pt States that her water broke at 0125 clear, moderate, No odor. No bleeding +FM.    Pain score: 2/10 ctx lower abdominal pain Vitals:   07/07/22 0200  BP: 134/70  Pulse: (!) 120  Resp: 17  Temp: 98.5 F (36.9 C)  SpO2: 99%     FHT:A: 151 B 146   Lab orders placed from triage:   Labor eval.

## 2022-07-08 ENCOUNTER — Encounter: Payer: Self-pay | Admitting: Obstetrics and Gynecology

## 2022-07-08 DIAGNOSIS — O09899 Supervision of other high risk pregnancies, unspecified trimester: Secondary | ICD-10-CM | POA: Diagnosis present

## 2022-07-08 DIAGNOSIS — O42113 Preterm premature rupture of membranes, onset of labor more than 24 hours following rupture, third trimester: Secondary | ICD-10-CM

## 2022-07-08 DIAGNOSIS — O42919 Preterm premature rupture of membranes, unspecified as to length of time between rupture and onset of labor, unspecified trimester: Secondary | ICD-10-CM | POA: Diagnosis present

## 2022-07-08 DIAGNOSIS — Z8751 Personal history of pre-term labor: Secondary | ICD-10-CM | POA: Diagnosis present

## 2022-07-08 HISTORY — DX: Personal history of pre-term labor: Z87.51

## 2022-07-08 NOTE — Progress Notes (Signed)
FACULTY PRACTICE ANTEPARTUM(COMPREHENSIVE) NOTE  Autumn Duarte is a 26 y.o. [id number] with Estimated Date of Delivery: 08/30/22   By  early ultrasound [redacted]w[redacted]d  who is admitted for PROM.    Fetal presentation is  vertex/vertex . Length of Stay:  1  Days  Date of admission:07/07/2022  Subjective: Pt had noted some mild cramping overnight, but it has since stopped and she was able to get some rest. Patient reports the fetal movement as active. Patient reports uterine contraction  activity as none. Patient reports  vaginal bleeding as none. Patient describes fluid per vagina as None.  Vitals:  Blood pressure 123/60, pulse (!) 101, temperature 98 F (36.7 C), temperature source Oral, resp. rate 17, height 5\' 4"  (1.626 m), weight (!) 156.9 kg, last menstrual period 11/11/2021, SpO2 100 %. Vitals:   07/07/22 1615 07/07/22 2113 07/08/22 0012 07/08/22 0454  BP: 102/69 (!) 107/46 109/63 123/60  Pulse: (!) 115 (!) 114 (!) 109 (!) 101  Resp: 18  18 17   Temp: (!) 97.5 F (36.4 C)  98.8 F (37.1 C) 98 F (36.7 C)  TempSrc: Oral  Oral Oral  SpO2: 100%  100% 100%  Weight:      Height:       Physical Examination:  General appearance - alert, well appearing, and in no distress Mental status - normal mood, behavior, speech, dress, motor activity, and thought processes Chest - CTAB Heart - normal rate and regular rhythm Abdomen - obese,gravid, non-tender Extremities - no edema, no calf tenderness bilaterally Skin - warm and dry  Fetal Monitoring:   Twin A: 130, moderate variability, +accels x2, no decels  Twin B: 140, moderate variability, +accels x 2, no decels  reactive x 2  Labs:  No results found for this or any previous visit (from the past 24 hour(s)).  Medications:  Scheduled  [START ON 07/09/2022] amoxicillin  500 mg Oral TID   docusate sodium  100 mg Oral Daily   prenatal multivitamin  1 tablet Oral Q1200   I have reviewed the patient's current medications.  ASSESSMENT: G2P1002 [redacted]w[redacted]d  Estimated Date of Delivery: 08/30/22  Patient Active Problem List   Diagnosis Date Noted   Premature rupture of membranes 07/07/2022   Supervision of high risk pregnancy, antepartum 01/18/2022   Chronic hypertension during pregnancy, antepartum 01/11/2022   Mild intermittent asthma without complication 12/21/2021   Adjustment disorder with mixed anxiety and depressed mood 01/29/2020   Prediabetes 11/18/2019   Dichorionic diamniotic twin gestation 06/11/2018   BMI 50.0-59.9, adult (HCC) 06/11/2018   Maternal morbid obesity, antepartum (HCC) 06/11/2018   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  Discordance 6%  3) Chronic HTN -no meds,BP appropriate  DISPO: Continue antepartum care as outlined above.  Plan for delivery @ 34wks or if clinically indicated   09-24-1990 07/08/2022,7:10 AM

## 2022-07-09 ENCOUNTER — Encounter (HOSPITAL_COMMUNITY): Payer: Self-pay | Admitting: Obstetrics and Gynecology

## 2022-07-09 DIAGNOSIS — Z3A32 32 weeks gestation of pregnancy: Secondary | ICD-10-CM | POA: Diagnosis not present

## 2022-07-09 DIAGNOSIS — O30043 Twin pregnancy, dichorionic/diamniotic, third trimester: Secondary | ICD-10-CM | POA: Diagnosis not present

## 2022-07-09 DIAGNOSIS — O10919 Unspecified pre-existing hypertension complicating pregnancy, unspecified trimester: Secondary | ICD-10-CM | POA: Diagnosis not present

## 2022-07-09 DIAGNOSIS — O42913 Preterm premature rupture of membranes, unspecified as to length of time between rupture and onset of labor, third trimester: Secondary | ICD-10-CM

## 2022-07-09 MED ORDER — ASPIRIN 81 MG PO TBEC
162.0000 mg | DELAYED_RELEASE_TABLET | Freq: Every day | ORAL | Status: DC
  Administered 2022-07-09 – 2022-07-17 (×9): 162 mg via ORAL
  Filled 2022-07-09 (×12): qty 2

## 2022-07-09 NOTE — Progress Notes (Signed)
Patient ID: Autumn Duarte, female   DOB: [birth year], 26 y.o.   MRN: [id number] FACULTY PRACTICE ANTEPARTUM(COMPREHENSIVE) NOTE  Autumn Duarte is a 26 y.o. G2P1002 at [redacted]w[redacted]d by best clinical estimate who is admitted for PROM.   Fetal presentation is cephalic/cephalic. Length of Stay:  2  Days  ASSESSMENT: Principal Problem:   Premature rupture of membranes Active Problems:   Chronic hypertension affecting pregnancy   Preterm premature rupture of membranes (PPROM) with unknown onset of labor   PLAN: 1) PPROM -no evidence of infection or labor -currently on latency antibiotics -Betamethasone given 12/7-8 -s/p NICU consult   2) Fetal well being -NSTs reactive x 2 -continue q shift monitoring -growth q 3-4 wks, last growth 11/22: Twin A 3#11oz (65%), Twin B 3#14ox (81%).  Discordance 6%   3) Chronic HTN -no meds,BP appropriate  Subjective: Doing well Patient reports the fetal movement as active. Patient reports uterine contraction  activity as none. Patient reports  vaginal bleeding as none. Patient describes fluid per vagina as Clear.  Vitals:  Blood pressure (!) 117/56, pulse (!) 105, temperature 97.8 F (36.6 C), temperature source Oral, resp. rate 20, height 5\' 4"  (1.626 m), weight (!) 156.9 kg, last menstrual period 11/11/2021, SpO2 98 %. Physical Examination:  General appearance - alert, well appearing, and in no distress Chest - normal effort Abdomen - gravid, non-tender Fundal Height:  size greater than dates Extremities: extremities normal, atraumatic, no cyanosis or edema  Membranes:ruptured, clear fluid  Fetal Monitoring: A Baseline: 140 bpm, Variability: Good {> 6 bpm), Accelerations: Reactive, and Decelerations: Absent B NST:  Baseline: 130 bpm, Variability: Good {> 6 bpm), Accelerations: Reactive, and Decelerations: Absent  Labs:  No results found for this or any previous visit (from the past 24 hour(s)).  Imaging Studies:      Medications:  Scheduled   amoxicillin  500 mg Oral TID   docusate sodium  100 mg Oral Daily   prenatal multivitamin  1 tablet Oral Q1200   I have reviewed the patient's current medications.   11/13/2021, MD 07/09/2022,8:30 AM

## 2022-07-10 DIAGNOSIS — Z3A32 32 weeks gestation of pregnancy: Secondary | ICD-10-CM | POA: Diagnosis not present

## 2022-07-10 DIAGNOSIS — O42913 Preterm premature rupture of membranes, unspecified as to length of time between rupture and onset of labor, third trimester: Secondary | ICD-10-CM | POA: Diagnosis not present

## 2022-07-10 LAB — CBC
HCT: 23.8 % — ABNORMAL LOW (ref 36.0–46.0)
Hemoglobin: 7.6 g/dL — ABNORMAL LOW (ref 12.0–15.0)
MCH: 28.4 pg (ref 26.0–34.0)
MCHC: 31.9 g/dL (ref 30.0–36.0)
MCV: 88.8 fL (ref 80.0–100.0)
Platelets: 226 10*3/uL (ref 150–400)
RBC: 2.68 MIL/uL — ABNORMAL LOW (ref 3.87–5.11)
RDW: 14.7 % (ref 11.5–15.5)
WBC: 11.4 10*3/uL — ABNORMAL HIGH (ref 4.0–10.5)
nRBC: 0.3 % — ABNORMAL HIGH (ref 0.0–0.2)

## 2022-07-10 LAB — TYPE AND SCREEN
ABO/RH(D): O POS
Antibody Screen: NEGATIVE

## 2022-07-10 MED ORDER — SODIUM CHLORIDE 0.9 % IV SOLN
500.0000 mg | Freq: Once | INTRAVENOUS | Status: AC
  Administered 2022-07-10: 500 mg via INTRAVENOUS
  Filled 2022-07-10: qty 500

## 2022-07-10 MED ORDER — ZOLPIDEM TARTRATE 5 MG PO TABS
5.0000 mg | ORAL_TABLET | Freq: Every evening | ORAL | Status: DC | PRN
  Administered 2022-07-10 – 2022-07-16 (×4): 5 mg via ORAL
  Filled 2022-07-10 (×4): qty 1

## 2022-07-10 NOTE — Progress Notes (Addendum)
Patient ID: Autumn Duarte, female   DOB: 1995-11-17, 26 y.o.   MRN: [id number] [place]) NOTE  Autumn Duarte is a 26 y.o. G2P1002 at [redacted]w[redacted]d by best clinical estimate who is admitted for PROM.   Fetal presentation is cephalic/cephalic. Length of Stay:  3  Days  ASSESSMENT: Principal Problem:   Premature rupture of membranes Active Problems:   Chronic hypertension affecting pregnancy   Preterm premature rupture of membranes (PPROM) with unknown onset of labor   PLAN: PPROM -no evidence of infection or labor -currently on latency antibiotics -Betamethasone given 12/7-8 -s/p NICU consult   Fetal well being -NSTs reactive x 2 -continue q shift monitoring -growth q 3-4 wks, last growth 11/22: Twin A 3#11oz (65%), Twin B 3#14ox (81%).  Discordance 6%   Chronic HTN -no meds,BP appropriate  Anemia -no active bleeding -Will give IV venofer -Saline lock IV  Subjective: Feels well Patient reports the fetal movement as active. Patient reports uterine contraction  activity as none. Patient reports  vaginal bleeding as none. Patient describes fluid per vagina as None.  Vitals:  Blood pressure 139/83, pulse (!) 107, temperature 97.7 F (36.5 C), temperature source Oral, resp. rate 20, height 5\' 4"  (1.626 m), weight (!) 156.9 kg, last menstrual period 11/11/2021, SpO2 100 %. Physical Examination:  General appearance - alert, well appearing, and in no distress Chest - normal effort Abdomen - gravid, non-tender Fundal Height:  size greater than dates Extremities: Homans sign is negative, no sign of DVT  Membranes:ruptured, clear fluid  Fetal Monitoring: A Baseline: 145 bpm, Variability: Good {> 6 bpm), Accelerations: Reactive, and Decelerations: Absent B Baseline: 145 bpm, Variability: Good {> 6 bpm), Accelerations: Reactive, and Decelerations: Absent Labs:  Results for orders placed or performed during the hospital encounter of 07/07/22 (from the past  24 hour(s))  Type and screen [place]   Collection Time: 07/10/22  3:38 AM  Result Value Ref Range   ABO/RH(D) O POS    Antibody Screen NEG    Sample Expiration      07/13/2022,2359 Performed at [facility], [place] [address], [place], [place] [address]   CBC   Collection Time: 07/10/22  3:42 AM  Result Value Ref Range   WBC 11.4 (H) 4.0 - 10.5 K/uL   RBC 2.68 (L) 3.87 - 5.11 MIL/uL   Hemoglobin 7.6 (L) 12.0 - 15.0 g/dL   HCT 23.8 (L) 36.0 - 46.0 %   MCV 88.8 80.0 - 100.0 fL   MCH 28.4 26.0 - 34.0 pg   MCHC 31.9 30.0 - 36.0 g/dL   RDW 14.7 11.5 - 15.5 %   Platelets 226 150 - 400 K/uL   nRBC 0.3 (H) 0.0 - 0.2 %    Imaging Studies:    Biometry (Fetus A)  BPD:      82.4  mm     G. Age:  33w 1d         98  %    CI:        75.11   %    70 - 86                                                          FL/HC:      19.6   %  19.2 - 21.4  HC:      301.6  mm     G. Age:  33w 3d         94  %    HC/AC:      1.16        0.99 - 1.21  AC:      260.3  mm     G. Age:  30w 1d         46  %    FL/BPD:     71.6   %    71 - 87  FL:         59  mm     G. Age:  30w 5d         54  %    FL/AC:      22.7   %    20 - 24  Est. FW:    1663  gm    3 lb 11 oz      65  %     FW Discordancy         6  % Biometry (Fetus B)  BPD:      74.1  mm     G. Age:  29w 5d         25  %    CI:        70.06   %    70 - 86                                                          FL/HC:      21.0   %    19.2 - 21.4  HC:      282.4  mm     G. Age:  31w 0d         37  %    HC/AC:      1.01        0.99 - 1.21  AC:      280.7  mm     G. Age:  32w 1d         92  %    FL/BPD:     80.0   %    71 - 87  FL:       59.3  mm     G. Age:  30w 6d         58  %    FL/AC:      21.1   %    20 - 24  Est. FW:    1765  gm    3 lb 14 oz      81  %     FW Discordancy      0 \ 6 %  Sonographic findings  Di-di intrauterine pregnancy at 30w 1d  Twin A  Cephalic presentaton  Limited fetal anatomy appears normal with  stomach and  bladder seen.  Fetal biometry shows 65 percentile.  Normal amniotic fluid.  Placenta is anterior.  Twin B  Cephalic presentaton  Limited fetal anatomy appears normal with stomach and  bladder seen.  Fetal biometry shows 81 percentile.  Normal amniotic fluid.  Placenta is posterior.  Recommendations  -F/u growth in 4 weeks  -BPPs starting at 32w  Medications:  Scheduled  amoxicillin  500 mg Oral TID   aspirin EC  162 mg Oral Daily   docusate sodium  100 mg Oral Daily   prenatal multivitamin  1 tablet Oral Q1200   I have reviewed the patient's current medications.   Reva Bores, MD 07/10/2022,8:22 AM

## 2022-07-11 ENCOUNTER — Inpatient Hospital Stay (HOSPITAL_BASED_OUTPATIENT_CLINIC_OR_DEPARTMENT_OTHER): Payer: Medicaid Other

## 2022-07-11 DIAGNOSIS — O99213 Obesity complicating pregnancy, third trimester: Secondary | ICD-10-CM | POA: Diagnosis not present

## 2022-07-11 DIAGNOSIS — O99333 Smoking (tobacco) complicating pregnancy, third trimester: Secondary | ICD-10-CM

## 2022-07-11 DIAGNOSIS — O30043 Twin pregnancy, dichorionic/diamniotic, third trimester: Secondary | ICD-10-CM | POA: Diagnosis not present

## 2022-07-11 DIAGNOSIS — Z3A32 32 weeks gestation of pregnancy: Secondary | ICD-10-CM | POA: Diagnosis not present

## 2022-07-11 DIAGNOSIS — E669 Obesity, unspecified: Secondary | ICD-10-CM

## 2022-07-11 DIAGNOSIS — O42913 Preterm premature rupture of membranes, unspecified as to length of time between rupture and onset of labor, third trimester: Secondary | ICD-10-CM | POA: Diagnosis not present

## 2022-07-11 DIAGNOSIS — Z87891 Personal history of nicotine dependence: Secondary | ICD-10-CM

## 2022-07-11 DIAGNOSIS — Z369 Encounter for antenatal screening, unspecified: Secondary | ICD-10-CM

## 2022-07-11 DIAGNOSIS — O10013 Pre-existing essential hypertension complicating pregnancy, third trimester: Secondary | ICD-10-CM | POA: Diagnosis not present

## 2022-07-11 NOTE — Progress Notes (Signed)
Patient ID: Autumn Duarte, female   DOB: [birth year], 26 y.o.   MRN: [id number] [facility]ACULT[facility] PRACTICE ANTEPARTUM(COMPREHENSIVE) NOTE  Autumn Duarte is a 26 y.o. G2P1002 at [redacted]w[redacted]d by best clinical estimate who is admitted for PROM.   Fetal presentation is cephalic/cephalic. Length of Stay:  4  Days  ASSESSMENT: Principal Problem:   Premature rupture of membranes Active Problems:   Chronic hypertension affecting pregnancy   Preterm premature rupture of membranes (PPROM) with unknown onset of labor   PLAN: PPROM -no evidence of infection or labor -currently on latency antibiotics -Betamethasone given 12/7-8 -s/p NICU consult   Fetal well being -NSTs reactive x 2 -continue q shift monitoring -growth q 3-4 wks, last growth 11/22: Twin A 3#11oz (65%), Twin B 3#14ox (81%).  Discordance 6%   Chronic HTN -no meds,BP appropriate   Anemia -no active bleeding -Will give IV venofer -Saline lock IV  Subjective: Feels well Patient reports the fetal movement as active. Patient reports uterine contraction  activity as none. Patient reports  vaginal bleeding as none. Patient describes fluid per vagina as Clear.  Vitals:  Blood pressure 128/63, pulse (!) 108, temperature 97.7 F (36.5 C), temperature source Oral, resp. rate 18, height 5\' 4"  (1.626 m), weight (!) 156.9 kg, last menstrual period 11/11/2021, SpO2 98 %. Physical Examination:  General appearance - alert, well appearing, and in no distress Chest - normal effort Abdomen - gravid, non-tender Fundal Height:  size greater than dates Extremities: Homans sign is negative, no sign of DVT  Membranes:ruptured, clear fluid  Fetal Monitoring:  A Baseline: 145 bpm, Variability: Good {> 6 bpm), Accelerations: Reactive, and Decelerations: Absent NST: B Baseline: 150 bpm, Variability: Good {> 6 bpm), Accelerations: Reactive, and Decelerations: Absent  Labs:  No results found for this or any previous visit (from the past 24 hour(s)).  Imaging  Studies:    Biometry (Fetus A)  BPD:      82.4  mm     G. Age:  33w 1d         98  %    CI:        75.11   %    70 - 86                                                          FL/HC:      19.6   %    19.2 - 21.4  HC:      301.6  mm     G. Age:  33w 3d         94  %    HC/AC:      1.16        0.99 - 1.21  AC:      260.3  mm     G. Age:  30w 1d         46  %    FL/BPD:     71.6   %    71 - 87  FL:         59  mm     G. Age:  30w 5d         54  %    FL/AC:      22.7   %    20 - 24  Est. FW:    1663  gm    3 lb 11 oz      65  %     FW Discordancy         6  % Biometry (Fetus B)  BPD:      74.1  mm     G. Age:  29w 5d         25  %    CI:        70.06   %    70 - 86                                                          FL/HC:      21.0   %    19.2 - 21.4  HC:      282.4  mm     G. Age:  31w 0d         37  %    HC/AC:      1.01        0.99 - 1.21  AC:      280.7  mm     G. Age:  32w 1d         92  %    FL/BPD:     80.0   %    71 - 87  FL:       59.3  mm     G. Age:  30w 6d         58  %    FL/AC:      21.1   %    20 - 24  Est. FW:    1765  gm    3 lb 14 oz      81  %     FW Discordancy      0 \ 6 % Comments  Sonographic findings  Di-di intrauterine pregnancy at 30w 1d  Twin A  Cephalic presentaton  Limited fetal anatomy appears normal with stomach and  bladder seen.  Fetal biometry shows 65 percentile.  Normal amniotic fluid.  Placenta is anterior.  Twin B  Cephalic presentaton  Limited fetal anatomy appears normal with stomach and  bladder seen.  Fetal biometry shows 81 percentile.  Normal amniotic fluid.  Placenta is posterior.  Recommendations  -F/u growth in 4 weeks  -BPPs starting at 32w  Today's ultrasound is limited by fetal position and maternal  body habitus. We will continue to visualize the anatomy at  future visitis Medications:  Scheduled  amoxicillin  500 mg Oral TID   aspirin EC  162 mg Oral Daily   docusate sodium  100 mg Oral Daily   prenatal multivitamin  1  tablet Oral Q1200   I have reviewed the patient's current medications.   Reva Bores, MD 07/11/2022,9:01 AM

## 2022-07-12 LAB — OB RESULTS CONSOLE GBS: GBS: NEGATIVE

## 2022-07-12 NOTE — Progress Notes (Addendum)
[facility] COMPREHENSIVE PROGRESS NOTE  Autumn Duarte is a 26 y.o. G2P1002 at [redacted]w[redacted]d with di/di twins who is admitted for PPROM.  Estimated Date of Delivery: 08/30/22 Fetal presentation is cephalic/cephalic  Length of Stay:  5 Days. Admitted 07/07/2022  Subjective: No complaints this morning. Patient reports good fetal movement.  Denies uterine contractions, bleeding, or change to LOF.  Vitals:  Blood pressure (!) 134/57, pulse (!) 105, temperature 98.2 F (36.8 C), temperature source Oral, resp. rate 18, height 5\' 4"  (1.626 m), weight (!) 156.9 kg, last menstrual period 11/11/2021, SpO2 97 %. Physical Examination: CONSTITUTIONAL: Well-developed, well-nourished female in no acute distress.  NEUROLOGIC: Alert and oriented to person, place, and time.  CARDIOVASCULAR: Normal heart rate, normal peripheral perfusion RESPIRATORY: Normal respiratory effort ABDOMEN: Soft, nontender, nondistended, gravid.  Fetal monitoring: FHR: A 145 bpm, Variability: moderate, Accelerations: Absent, Decelerations: Absent -- reactive PM NST on 12/11, plan for PM NST A 140bpm, moderate variability, accels present, decels absent  Uterine activity: 0 contractions per hour  No results found for this or any previous visit (from the past 48 hour(s)).  [place] MFM FETAL BPP WO NON STRESS  Result Date: 07/11/2022 ----------------------------------------------------------------------  OBSTETRICS REPORT                       (Signed Final 07/11/2022 05:07 pm) ---------------------------------------------------------------------- Patient Info  ID #:       [id number]                          D.O.B.:  [birth year] (26 yrs)  Name:       Autumn Duarte                    Visit Date: 07/11/2022 11:20 am ---------------------------------------------------------------------- Performed By  Attending:        Johnell Comings MD         Ref. Address:     [address]                                                             [place],  [place]  Performed By:     Valda Favia          [facility]                    [place]  Referred By:      Aletha Halim MD ---------------------------------------------------------------------- Orders  #  Description  Code        Ordered By  1  [place] MFM FETAL BPP WO NON               76819.01    TANYA PRATT     STRESS  2  [place] MFM FETAL BPP WO NST               76819.1     TANYA PRATT     ADDL GESTATION ----------------------------------------------------------------------  #  Order #                     Accession #                Episode #  1  [id number]                   [id number]                 [id number]  2  [id number]                   [id number]                 [id number] ---------------------------------------------------------------------- Indications  Obesity complicating pregnancy, third          O99.213  trimester (BMI 57)  Twin pregnancy, di/di, third trimester         O30.043  Hypertension - Chronic/Pre-existing (no        O10.019  meds)  LR NIPS (dizygotic, female/female)  Encounter for antenatal screening,             Z36.9  unspecified  [redacted] weeks gestation of pregnancy                Z3A.32 ---------------------------------------------------------------------- Fetal Evaluation (Fetus A)  Num Of Fetuses:         2  Fetal Heart Rate(bpm):  153  Cardiac Activity:       Observed  Fetal Lie:              Maternal right side-female  Presentation:           Cephalic  Placenta:               Anterior  P. Cord Insertion:      Previously Visualized  Amniotic Fluid  AFI FV:      Within normal limits  AFI Sum(cm)     %Tile       Largest Pocket(cm)  5.1             < 3         5.1  RUQ(cm)  5.1 ---------------------------------------------------------------------- Biophysical Evaluation (Fetus A)  Amniotic F.V:   Within normal limits       F. Tone:         Observed  F. Movement:    Observed                   Score:          8/8  F. Breathing:   Observed ---------------------------------------------------------------------- OB History  Blood Type:   O+  Gravidity:    2         Term:   1        Prem:   0        SAB:   0  TOP:          0       Ectopic:  0  Living: 2 ---------------------------------------------------------------------- Gestational Age (Fetus A)  LMP:           34w 4d        Date:  11/11/21                  EDD:   08/18/22  Best:          Milderd Meager 6d     Det. ByLoman Chroman         EDD:   08/30/22                                      (01/05/22) ---------------------------------------------------------------------- Anatomy (Fetus A)  Cranium:               Appears normal         Stomach:                Appears normal, left                                                                        sided  Thoracic:              Appears normal         Bladder:                Appears normal  Diaphragm:             Appears normal ---------------------------------------------------------------------- Fetal Evaluation (Fetus B)  Num Of Fetuses:         2  Fetal Heart Rate(bpm):  153  Cardiac Activity:       Observed  Fetal Lie:              Maternal left side-female  Presentation:           Cephalic  Placenta:               Posterior  P. Cord Insertion:      Visualized, central  Membrane Desc:      Dividing Membrane seen - Dichorionic.  Amniotic Fluid  AFI FV:      Within normal limits                              Largest Pocket(cm)                              6.1 ---------------------------------------------------------------------- Biophysical Evaluation (Fetus B)  Amniotic F.V:   Within normal limits       F. Tone:        Observed  F. Movement:    Observed                   Score:          8/8  F. Breathing:   Observed ---------------------------------------------------------------------- Gestational Age (Fetus B)  LMP:           34w 4d        Date:   11/11/21  EDD:   08/18/22  Best:          Milderd Meager 6d     Det. ByLoman Chroman         EDD:   08/30/22                                      (01/05/22) ---------------------------------------------------------------------- Anatomy (Fetus B)  Cranium:               Appears normal         Stomach:                Appears normal, left                                                                        sided  Thoracic:              Appears normal         Kidneys:                Appear normal  Diaphragm:             Appears normal         Bladder:                Appears normal ---------------------------------------------------------------------- Cervix Uterus Adnexa  Cervix  Not visualized (advanced GA >24wks)  Uterus  No abnormality visualized.  Right Ovary  No adnexal mass visualized.  Left Ovary  No adnexal mass visualized.  Cul De Sac  No free fluid seen.  Adnexa  No abnormality visualized. ---------------------------------------------------------------------- Comments  This patient has been admitted due to PPROM.  The patient's  pregnancy has been complicated by chronic hypertension  and maternal obesity with a BMI of 50.  A biophysical profile performed today was 8 out of 8 for both  twin A and twin B.  There was normal amniotic fluid noted around both twin A  and twin B.  Both fetuses are in the vertex/vertex presentations. ----------------------------------------------------------------------                   Johnell Comings, MD Electronically Signed Final Report   07/11/2022 05:07 pm ----------------------------------------------------------------------  US Fetal BPP WO NST Addl Gestation  Result Date: 07/11/2022 ----------------------------------------------------------------------  OBSTETRICS REPORT                       (Signed Final 07/11/2022 05:07 pm) ---------------------------------------------------------------------- Patient Info  ID #:       [id number]                          D.O.B.:   10-Sep-1995 (26 yrs)  Name:       Autumn Duarte                    Visit Date: 07/11/2022 11:20 am ---------------------------------------------------------------------- Performed By  Attending:        Johnell Comings MD         Ref. Address:     [address]  [place], [place]  Performed By:     Valda Favia          [facility]                    [place]  Referred By:      Aletha Halim MD ---------------------------------------------------------------------- Orders  #  Description                           Code        Ordered By  1  [place] MFM FETAL BPP WO NON               76819.01    TANYA PRATT     STRESS  2  [place] MFM FETAL BPP WO NST               [id number]     [place] ----------------------------------------------------------------------  #  Order #                     Accession #                Episode #  1  [id number]                   [id number]                 [id number]  2  [id number]                   [id number]                 [id number] ---------------------------------------------------------------------- Indications  Obesity complicating pregnancy, third          O99.213  trimester (BMI 44)  Twin pregnancy, di/di, third trimester         O30.043  Hypertension - Chronic/Pre-existing (no        O10.019  meds)  LR NIPS (dizygotic, female/female)  Encounter for antenatal screening,             Z36.9  unspecified  [redacted] weeks gestation of pregnancy                Z3A.32 ---------------------------------------------------------------------- Fetal Evaluation (Fetus A)  Num Of Fetuses:         2  Fetal Heart Rate(bpm):  153  Cardiac Activity:       Observed  Fetal Lie:              Maternal right side-female  Presentation:           Cephalic  Placenta:               Anterior  P.  Cord Insertion:       Previously Visualized  Amniotic Fluid  AFI FV:      Within normal limits  AFI Sum(cm)     %Tile       Largest Pocket(cm)  5.1             < 3         5.1  RUQ(cm)  5.1 ---------------------------------------------------------------------- Biophysical Evaluation (Fetus A)  Amniotic F.V:   Within normal limits       F. Tone:        Observed  F. Movement:    Observed                   Score:          8/8  F. Breathing:   Observed ---------------------------------------------------------------------- OB History  Blood Type:   O+  Gravidity:    2         Term:   1        Prem:   0        SAB:   0  TOP:          0       Ectopic:  0        Living: 2 ---------------------------------------------------------------------- Gestational Age (Fetus A)  LMP:           34w 4d        Date:  11/11/21                  EDD:   08/18/22  Best:          Milderd Meager 6d     Det. ByLoman Chroman         EDD:   08/30/22                                      (01/05/22) ---------------------------------------------------------------------- Anatomy (Fetus A)  Cranium:               Appears normal         Stomach:                Appears normal, left                                                                        sided  Thoracic:              Appears normal         Bladder:                Appears normal  Diaphragm:             Appears normal ---------------------------------------------------------------------- Fetal Evaluation (Fetus B)  Num Of Fetuses:         2  Fetal Heart Rate(bpm):  153  Cardiac Activity:       Observed  Fetal Lie:              Maternal left side-female  Presentation:           Cephalic  Placenta:  Posterior  P. Cord Insertion:      Visualized, central  Membrane Desc:      Dividing Membrane seen - Dichorionic.  Amniotic Fluid  AFI FV:      Within normal limits                              Largest Pocket(cm)                              6.1 ----------------------------------------------------------------------  Biophysical Evaluation (Fetus B)  Amniotic F.V:   Within normal limits       F. Tone:        Observed  F. Movement:    Observed                   Score:          8/8  F. Breathing:   Observed ---------------------------------------------------------------------- Gestational Age (Fetus B)  LMP:           34w 4d        Date:  11/11/21                  EDD:   08/18/22  Best:          Milderd Meager 6d     Det. ByLoman Chroman         EDD:   08/30/22                                      (01/05/22) ---------------------------------------------------------------------- Anatomy (Fetus B)  Cranium:               Appears normal         Stomach:                Appears normal, left                                                                        sided  Thoracic:              Appears normal         Kidneys:                Appear normal  Diaphragm:             Appears normal         Bladder:                Appears normal ---------------------------------------------------------------------- Cervix Uterus Adnexa  Cervix  Not visualized (advanced GA >24wks)  Uterus  No abnormality visualized.  Right Ovary  No adnexal mass visualized.  Left Ovary  No adnexal mass visualized.  Cul De Sac  No free fluid seen.  Adnexa  No abnormality visualized. ---------------------------------------------------------------------- Comments  This patient has been admitted due to PPROM.  The patient's  pregnancy has been complicated by chronic hypertension  and maternal obesity with a BMI of 50.  A biophysical profile performed today was 8 out of 8 for both  twin A and twin B.  There was normal amniotic fluid noted around both twin A  and twin B.  Both fetuses are in the vertex/vertex presentations. ----------------------------------------------------------------------                   Johnell Comings, MD Electronically Signed Final Report   07/11/2022 05:07 pm ----------------------------------------------------------------------   Current scheduled  medications  amoxicillin  500 mg Oral TID   aspirin EC  162 mg Oral Daily   docusate sodium  100 mg Oral Daily   prenatal multivitamin  1 tablet Oral Q1200   I have reviewed the patient's current medications.  ASSESSMENT: Principal Problem:   Premature rupture of membranes Active Problems:   Chronic hypertension affecting pregnancy   Preterm premature rupture of membranes (PPROM) with unknown onset of labor   PLAN: PPROM -no evidence of infection or labor -currently on latency antibiotics (12/7 - ) -Betamethasone given 12/7-8 -s/p NICU consult -Plan for delivery at 44/0, IOL ordered today   Fetal well being -NST reactive for baby B; NST baby A nonreactive but moderate variability & pt feeling good movement, plan for PM NST -continue q shift monitoring -weekly BPPs, BID NSTs -growth q 3-4 wks, last growth 11/22: Twin A 3#11oz (65%), Twin B 3#14ox (81%).  Discordance 6%   Chronic HTN -no meds,BP appropriate   Anemia -no active bleeding -s/p IV venofer -Saline lock IV  Continue routine antenatal care.  Gale Journey, MD [place], [facility] for [employer], [place]

## 2022-07-13 LAB — CBC
HCT: 25.2 % — ABNORMAL LOW (ref 36.0–46.0)
Hemoglobin: 8.6 g/dL — ABNORMAL LOW (ref 12.0–15.0)
MCH: 29.2 pg (ref 26.0–34.0)
MCHC: 34.1 g/dL (ref 30.0–36.0)
MCV: 85.4 fL (ref 80.0–100.0)
Platelets: 236 10*3/uL (ref 150–400)
RBC: 2.95 MIL/uL — ABNORMAL LOW (ref 3.87–5.11)
RDW: 14.8 % (ref 11.5–15.5)
WBC: 11.1 10*3/uL — ABNORMAL HIGH (ref 4.0–10.5)
nRBC: 0.4 % — ABNORMAL HIGH (ref 0.0–0.2)

## 2022-07-13 LAB — GC/CHLAMYDIA PROBE AMP (~~LOC~~) NOT AT ARMC
Chlamydia: NEGATIVE
Comment: NEGATIVE
Comment: NORMAL
Neisseria Gonorrhea: NEGATIVE

## 2022-07-13 LAB — TYPE AND SCREEN
ABO/RH(D): O POS
Antibody Screen: NEGATIVE

## 2022-07-13 NOTE — Progress Notes (Signed)
Patient ID: Autumn Duarte, female   DOB: 08/03/1995, 26 y.o.   MRN: [id number] FACULTY PRACTICE ANTEPARTUM COMPREHENSIVE PROGRESS NOTE  Autumn Duarte is a 26 y.o. G2P1002 at [redacted]w[redacted]d with di/di twins who is admitted for PPROM.  Estimated Date of Delivery: 08/30/22 Fetal presentation is cephalic/cephalic  Length of Stay:  6 Days. Admitted 07/07/2022  Subjective: No complaints this morning. She reports continued leakage of fluid.  Patient reports good fetal movement.  Denies uterine contractions, bleeding, or abdominal pain.  Vitals:  Blood pressure 117/75, pulse (!) 107, temperature 98.5 F (36.9 C), temperature source Oral, resp. rate 16, height 5\' 4"  (1.626 m), weight (!) 156.9 kg, last menstrual period 11/11/2021, SpO2 99 %. Physical Examination: CONSTITUTIONAL: Well-developed, well-nourished female in no acute distress.  NEUROLOGIC: Alert and oriented to person, place, and time.  CARDIOVASCULAR: Normal heart rate, normal peripheral perfusion RESPIRATORY: Normal respiratory effort ABDOMEN: Soft, nontender, gravid.  Fetal monitoring: FHR: A 150 bpm, Variability: moderate, Accelerations: Absent, Decelerations: Absent  A 145 bpm, moderate variability, accels present, decels absent  Uterine activity: 0 contractions per hour  Results for orders placed or performed during the hospital encounter of 07/07/22 (from the past 48 hour(s))  Culture, beta strep (group b only)     Status: None (Preliminary result)   Collection Time: 07/12/22 11:09 AM   Specimen: Vaginal/Rectal; Genital  Result Value Ref Range   Specimen Description VAGINAL/RECTAL    Special Requests NONE    Culture      CULTURE REINCUBATED FOR BETTER GROWTH Performed at [facility], [address], [place], [place]    Report Status PENDING   Type and screen [facility]     Status: None   Collection Time: 07/13/22  4:18 AM  Result Value Ref Range   ABO/RH(D) O POS    Antibody Screen NEG    Sample  Expiration      07/16/2022,2359 Performed at [facility], [address] [place], [place]   CBC     Status: Abnormal   Collection Time: 07/13/22  4:20 AM  Result Value Ref Range   WBC 11.1 (H) 4.0 - 10.5 K/uL   RBC 2.95 (L) 3.87 - 5.11 MIL/uL   Hemoglobin 8.6 (L) 12.0 - 15.0 g/dL   HCT 07/15/22 (L) 03.2 - 12.2 %   MCV 85.4 80.0 - 100.0 fL   MCH 29.2 26.0 - 34.0 pg   MCHC 34.1 30.0 - 36.0 g/dL   RDW 48.2 50.0 - 37.0 %   Platelets 236 150 - 400 K/uL   nRBC 0.4 (H) 0.0 - 0.2 %    Comment: Performed at [facility], [address] [place], [place]    [address] MFM FETAL BPP WO NON STRESS  Result Date: 07/11/2022 ----------------------------------------------------------------------  OBSTETRICS REPORT                       (Signed Final 07/11/2022 05:07 pm) ---------------------------------------------------------------------- Patient Info  ID #:       14/06/2022                          D.O.B.:  May 01, 1996 (26 yrs)  Name:       Autumn Duarte                    Visit Date: 07/11/2022 11:20 am ---------------------------------------------------------------------- Performed By  Attending:  Ma Rings MD         Ref. Address:     [address]                                                             [place], [place]                                                             [address]  Performed By:     Percell Boston          [facility]  Referred By:      [place] Bing MD ---------------------------------------------------------------------- Orders  #  Description                           Code        Ordered By  1  [place] MFM FETAL BPP WO NON               76819.01    TANYA PRATT     STRESS  2  [place] MFM FETAL BPP WO NST               13244.0     TANYA PRATT     ADDL GESTATION ----------------------------------------------------------------------  #  Order #                      Accession #                Episode #  1  [id number]                   [id number]                 [id number]  2  [id number]                   [id number]                 [id number] ---------------------------------------------------------------------- Indications  Obesity complicating pregnancy, third          O99.213  trimester (BMI 57)  Twin pregnancy, di/di, third trimester         O30.043  Hypertension - Chronic/Pre-existing (no        O10.019  meds)  LR NIPS (dizygotic, female/female)  Encounter for antenatal screening,             Z36.9  unspecified  [redacted] weeks gestation of pregnancy                Z3A.32 ---------------------------------------------------------------------- Fetal Evaluation (Fetus  A)  Num Of Fetuses:         2  Fetal Heart Rate(bpm):  153  Cardiac Activity:       Observed  Fetal Lie:              Maternal right side-female  Presentation:           Cephalic  Placenta:               Anterior  P. Cord Insertion:      Previously Visualized  Amniotic Fluid  AFI FV:      Within normal limits  AFI Sum(cm)     %Tile       Largest Pocket(cm)  5.1             < 3         5.1  RUQ(cm)  5.1 ---------------------------------------------------------------------- Biophysical Evaluation (Fetus A)  Amniotic F.V:   Within normal limits       F. Tone:        Observed  F. Movement:    Observed                   Score:          8/8  F. Breathing:   Observed ---------------------------------------------------------------------- OB History  Blood Type:   O+  Gravidity:    2         Term:   1        Prem:   0        SAB:   0  TOP:          0       Ectopic:  0        Living: 2 ---------------------------------------------------------------------- Gestational Age (Fetus A)  LMP:           34w 4d        Date:  11/11/21                  EDD:   08/18/22  Best:          Armida Sans 6d     Det. By:  Marcella Dubs         EDD:   08/30/22                                      (01/05/22)  ---------------------------------------------------------------------- Anatomy (Fetus A)  Cranium:               Appears normal         Stomach:                Appears normal, left                                                                        sided  Thoracic:              Appears normal         Bladder:                Appears normal  Diaphragm:  Appears normal ---------------------------------------------------------------------- Fetal Evaluation (Fetus B)  Num Of Fetuses:         2  Fetal Heart Rate(bpm):  153  Cardiac Activity:       Observed  Fetal Lie:              Maternal left side-female  Presentation:           Cephalic  Placenta:               Posterior  P. Cord Insertion:      Visualized, central  Membrane Desc:      Dividing Membrane seen - Dichorionic.  Amniotic Fluid  AFI FV:      Within normal limits                              Largest Pocket(cm)                              6.1 ---------------------------------------------------------------------- Biophysical Evaluation (Fetus B)  Amniotic F.V:   Within normal limits       F. Tone:        Observed  F. Movement:    Observed                   Score:          8/8  F. Breathing:   Observed ---------------------------------------------------------------------- Gestational Age (Fetus B)  LMP:           34w 4d        Date:  11/11/21                  EDD:   08/18/22  Best:          Armida Sans 6d     Det. ByMarcella Dubs         EDD:   08/30/22                                      (01/05/22) ---------------------------------------------------------------------- Anatomy (Fetus B)  Cranium:               Appears normal         Stomach:                Appears normal, left                                                                        sided  Thoracic:              Appears normal         Kidneys:                Appear normal  Diaphragm:             Appears normal         Bladder:                Appears normal  ---------------------------------------------------------------------- Cervix Uterus Adnexa  Cervix  Not visualized (advanced GA >24wks)  Uterus  No  abnormality visualized.  Right Ovary  No adnexal mass visualized.  Left Ovary  No adnexal mass visualized.  Cul De Sac  No free fluid seen.  Adnexa  No abnormality visualized. ---------------------------------------------------------------------- Comments  This patient has been admitted due to PPROM.  The patient's  pregnancy has been complicated by chronic hypertension  and maternal obesity with a BMI of 50.  A biophysical profile performed today was 8 out of 8 for both  twin A and twin B.  There was normal amniotic fluid noted around both twin A  and twin B.  Both fetuses are in the vertex/vertex presentations. ----------------------------------------------------------------------                   Ma Rings, MD Electronically Signed Final Report   07/11/2022 05:07 pm ----------------------------------------------------------------------  US Fetal BPP WO NST Addl Gestation  Result Date: 07/11/2022 ----------------------------------------------------------------------  OBSTETRICS REPORT                       (Signed Final 07/11/2022 05:07 pm) ---------------------------------------------------------------------- Patient Info  ID #:       [id number]                          D.O.B.:  [birth year] (26 yrs)  Name:       KIMBERLLY NORGARD Hoeger                    Visit Date: 07/11/2022 11:20 am ---------------------------------------------------------------------- Performed By  Attending:        Ma Rings MD         Ref. Address:     [address]                                                             [place], [place]                                                             [address]  Performed By:     Percell Boston          [facility]  Referred By:       Bing MD ---------------------------------------------------------------------- Orders  #  Description                           Code        Ordered By  1  [place] MFM FETAL BPP WO NON               [phone]  TANYA PRATT     STRESS  2  [place] MFM FETAL BPP WO NST               76819.1     Tinnie Gens     ADDL GESTATION ----------------------------------------------------------------------  #  Order #                     Accession #                Episode #  1  [id number]                   [id number]                 [id number]  2  [id number]                   [id number]                 [id number] ---------------------------------------------------------------------- Indications  Obesity complicating pregnancy, third          O99.213  trimester (BMI 57)  Twin pregnancy, di/di, third trimester         O30.043  Hypertension - Chronic/Pre-existing (no        O10.019  meds)  LR NIPS (dizygotic, female/female)  Encounter for antenatal screening,             Z36.9  unspecified  [redacted] weeks gestation of pregnancy                Z3A.32 ---------------------------------------------------------------------- Fetal Evaluation (Fetus A)  Num Of Fetuses:         2  Fetal Heart Rate(bpm):  153  Cardiac Activity:       Observed  Fetal Lie:              Maternal right side-female  Presentation:           Cephalic  Placenta:               Anterior  P. Cord Insertion:      Previously Visualized  Amniotic Fluid  AFI FV:      Within normal limits  AFI Sum(cm)     %Tile       Largest Pocket(cm)  5.1             < 3         5.1  RUQ(cm)  5.1 ---------------------------------------------------------------------- Biophysical Evaluation (Fetus A)  Amniotic F.V:   Within normal limits       F. Tone:        Observed  F. Movement:    Observed                   Score:          8/8  F. Breathing:   Observed ---------------------------------------------------------------------- OB History  Blood Type:   O+  Gravidity:    2         Term:   1        Prem:   0         SAB:   0  TOP:          0       Ectopic:  0        Living: 2 ---------------------------------------------------------------------- Gestational Age (Fetus A)  LMP:           34w 4d        Date:  11/11/21  EDD:   08/18/22  Best:          Armida Sans32w 6d     Det. ByMarcella Dubs:  Early Ultrasound         EDD:   08/30/22                                      (01/05/22) ---------------------------------------------------------------------- Anatomy (Fetus A)  Cranium:               Appears normal         Stomach:                Appears normal, left                                                                        sided  Thoracic:              Appears normal         Bladder:                Appears normal  Diaphragm:             Appears normal ---------------------------------------------------------------------- Fetal Evaluation (Fetus B)  Num Of Fetuses:         2  Fetal Heart Rate(bpm):  153  Cardiac Activity:       Observed  Fetal Lie:              Maternal left side-female  Presentation:           Cephalic  Placenta:               Posterior  P. Cord Insertion:      Visualized, central  Membrane Desc:      Dividing Membrane seen - Dichorionic.  Amniotic Fluid  AFI FV:      Within normal limits                              Largest Pocket(cm)                              6.1 ---------------------------------------------------------------------- Biophysical Evaluation (Fetus B)  Amniotic F.V:   Within normal limits       F. Tone:        Observed  F. Movement:    Observed                   Score:          8/8  F. Breathing:   Observed ---------------------------------------------------------------------- Gestational Age (Fetus B)  LMP:           34w 4d        Date:  11/11/21                  EDD:   08/18/22  Best:          Armida Sans32w 6d     Det. ByMarcella Dubs:  Early Ultrasound         EDD:   08/30/22                                      (  01/05/22) ---------------------------------------------------------------------- Anatomy (Fetus B)  Cranium:                Appears normal         Stomach:                Appears normal, left                                                                        sided  Thoracic:              Appears normal         Kidneys:                Appear normal  Diaphragm:             Appears normal         Bladder:                Appears normal ---------------------------------------------------------------------- Cervix Uterus Adnexa  Cervix  Not visualized (advanced GA >24wks)  Uterus  No abnormality visualized.  Right Ovary  No adnexal mass visualized.  Left Ovary  No adnexal mass visualized.  Cul De Sac  No free fluid seen.  Adnexa  No abnormality visualized. ---------------------------------------------------------------------- Comments  This patient has been admitted due to PPROM.  The patient's  pregnancy has been complicated by chronic hypertension  and maternal obesity with a BMI of 50.  A biophysical profile performed today was 8 out of 8 for both  twin A and twin B.  There was normal amniotic fluid noted around both twin A  and twin B.  Both fetuses are in the vertex/vertex presentations. ----------------------------------------------------------------------                   Ma Rings, MD Electronically Signed Final Report   07/11/2022 05:07 pm ----------------------------------------------------------------------   Current scheduled medications  amoxicillin  500 mg Oral TID   aspirin EC  162 mg Oral Daily   docusate sodium  100 mg Oral Daily   prenatal multivitamin  1 tablet Oral Q1200   I have reviewed the patient's current medications.  ASSESSMENT: Principal Problem:   Premature rupture of membranes Active Problems:   Chronic hypertension affecting pregnancy   Preterm premature rupture of membranes (PPROM) with unknown onset of labor   PLAN: PPROM -no evidence of infection or labor -currently on latency antibiotics  -Patient completed  a course of Betamethasone  -s/p NICU consult -Plan for  delivery at 34/0   Fetal well being -NST reactive x2  -continue q shift monitoring -weekly BPPs, BID NSTs -growth q 3-4 wks, last growth 11/22: Twin A 3#11oz (65%), Twin B 3#14ox (81%).  Discordance 6%   Chronic HTN -no meds,BP appropriate   Anemia -no active bleeding -s/p IV venofer -Saline lock IV  Continue routine antenatal care.  Catalina Antigua, MD Obstetrician & Gynecologist, [facility] for [employer], [facility]

## 2022-07-14 ENCOUNTER — Encounter: Payer: Self-pay | Admitting: *Deleted

## 2022-07-14 ENCOUNTER — Ambulatory Visit: Payer: Medicaid Other

## 2022-07-14 LAB — CULTURE, BETA STREP (GROUP B ONLY)

## 2022-07-14 MED ORDER — RSV PRE-FUSION F A&B VAC RCMB 120 MCG/0.5ML IM SOLR
0.5000 mL | Freq: Once | INTRAMUSCULAR | Status: AC
  Administered 2022-07-14: 0.5 mL via INTRAMUSCULAR
  Filled 2022-07-14: qty 0.5

## 2022-07-14 NOTE — Progress Notes (Signed)
Patient ID: Autumn Duarte, female   DOB: 1996/06/02, 26 y.o.   MRN: [id number] FACULTY PRACTICE ANTEPARTUM COMPREHENSIVE PROGRESS NOTE  Autumn Duarte is a 26 y.o. G2P1002 at [redacted]w[redacted]d with di/di twins who is admitted for PPROM.  Estimated Date of Delivery: 08/30/22 Fetal presentation is cephalic/cephalic  Length of Stay:  7 Days. Admitted 07/07/2022  Subjective: No complaints this morning. She reports continued leakage of fluid.  Patient reports good fetal movement.  Denies uterine contractions, bleeding, or abdominal pain.  Vitals:  Blood pressure (!) 103/51, pulse (!) 101, temperature 98 F (36.7 C), temperature source Oral, resp. rate 19, height 5\' 4"  (1.626 m), weight (!) 156.9 kg, last menstrual period 11/11/2021, SpO2 100 %. Physical Examination: CONSTITUTIONAL: Well-developed, well-nourished female in no acute distress.  NEUROLOGIC: Alert and oriented to person, place, and time.  CARDIOVASCULAR: Normal heart rate, normal peripheral perfusion RESPIRATORY: Normal respiratory effort ABDOMEN: Soft, nontender, gravid.  Fetal monitoring: FHR: A 150 bpm, Variability: moderate, Accelerations: Absent, Decelerations: Absent  A 145 bpm, moderate variability, accels present, decels absent  Uterine activity: 0 contractions per hour  Results for orders placed or performed during the hospital encounter of 07/07/22 (from the past 48 hour(s))  Type and screen [facility]     Status: None   Collection Time: 07/13/22  4:18 AM  Result Value Ref Range   ABO/RH(D) O POS    Antibody Screen NEG    Sample Expiration      07/16/2022,2359 Performed at [facility], [address] [place], [place]   CBC     Status: Abnormal   Collection Time: 07/13/22  4:20 AM  Result Value Ref Range   WBC 11.1 (H) 4.0 - 10.5 K/uL   RBC 2.95 (L) 3.87 - 5.11 MIL/uL   Hemoglobin 8.6 (L) 12.0 - 15.0 g/dL   HCT 07/15/22 (L) 19.1 - 47.8 %   MCV 85.4 80.0 - 100.0 fL   MCH 29.2 26.0 - 34.0 pg    MCHC 34.1 30.0 - 36.0 g/dL   RDW 29.5 62.1 - 30.8 %   Platelets 236 150 - 400 K/uL   nRBC 0.4 (H) 0.0 - 0.2 %    Comment: Performed at [facility], [address] [place], [place]    No results found.  Current scheduled medications  aspirin EC  162 mg Oral Daily   docusate sodium  100 mg Oral Daily   prenatal multivitamin  1 tablet Oral Q1200   I have reviewed the patient's current medications.  ASSESSMENT: Principal Problem:   Premature rupture of membranes Active Problems:   Chronic hypertension affecting pregnancy   Preterm premature rupture of membranes (PPROM) with unknown onset of labor   PLAN: PPROM -no evidence of infection or labor -s/p latency antibiotics  -Patient completed  a course of Betamethasone  -s/p NICU consult -IOL scheduled for 12/21. Pt agreeable to this.    Fetal well being -NST reactive x2  -NST daily -weekly BPPs -growth q 3-4 wks, last growth 11/22: Twin A 3#11oz (65%), Twin B 3#14ox (81%).  Discordance 6% - RSV vaccine discussed with pt. She accepts. Pharmacy called.    Chronic HTN -no meds, BP appropriate   Anemia -no active bleeding -s/p IV venofer -Saline lock IV  Continue routine antenatal care.  12/22, MD Attending Obstetrician & Gynecologist, [facility] for [facility], [facility]

## 2022-07-15 DIAGNOSIS — Z3A33 33 weeks gestation of pregnancy: Secondary | ICD-10-CM

## 2022-07-15 LAB — CBC
HCT: 28.3 % — ABNORMAL LOW (ref 36.0–46.0)
Hemoglobin: 9.3 g/dL — ABNORMAL LOW (ref 12.0–15.0)
MCH: 28.6 pg (ref 26.0–34.0)
MCHC: 32.9 g/dL (ref 30.0–36.0)
MCV: 87.1 fL (ref 80.0–100.0)
Platelets: 211 10*3/uL (ref 150–400)
RBC: 3.25 MIL/uL — ABNORMAL LOW (ref 3.87–5.11)
RDW: 15.4 % (ref 11.5–15.5)
WBC: 10.7 10*3/uL — ABNORMAL HIGH (ref 4.0–10.5)
nRBC: 0.3 % — ABNORMAL HIGH (ref 0.0–0.2)

## 2022-07-15 MED ORDER — TERBUTALINE SULFATE 1 MG/ML IJ SOLN
INTRAMUSCULAR | Status: AC
  Administered 2022-07-15: 0.25 mg via SUBCUTANEOUS
  Filled 2022-07-15: qty 1

## 2022-07-15 MED ORDER — LACTATED RINGERS IV BOLUS
1000.0000 mL | Freq: Once | INTRAVENOUS | Status: AC
  Administered 2022-07-15: 1000 mL via INTRAVENOUS

## 2022-07-15 MED ORDER — TERBUTALINE SULFATE 1 MG/ML IJ SOLN: 0.2500 mg | Freq: Once | INTRAMUSCULAR | Status: AC

## 2022-07-15 NOTE — Progress Notes (Signed)
Initial Nutrition Assessment  DOCUMENTATION CODES:  Morbid obesity  INTERVENTION:  Regular Diet  NUTRITION DIAGNOSIS:   Increased nutrient needs related to  (twin pregnancy and fetal growth requirements) as evidenced by  (33 weeks IUP).  GOAL:   Patient will meet greater than or equal to 90% of their needs  MONITOR:   Weight trends  REASON FOR ASSESSMENT:   Antenatal, LOS    ASSESSMENT:   Now 33 3/7 weeks IUP, amd with PROM and Di/Di twins. Pre-pregnancy weight 147.9 kg, BMI 55.9. 9 kg weight gain. Delivery at 34 weeks. 12/13 Hct 25.2 %, s/p transfusion. On prenatal vits   Diet Order:   Diet Order             Diet regular Room service appropriate? Yes; Fluid consistency: Thin  Diet effective now                   EDUCATION NEEDS:   No education needs have been identified at this time  Skin:  Skin Assessment: Reviewed RN Assessment    Height:   Ht Readings from Last 1 Encounters:  07/07/22 5\' 4"  (1.626 m)    Weight:   Wt Readings from Last 1 Encounters:  07/07/22 (!) 156.9 kg    Ideal Body Weight:   120 lbs  BMI:  Body mass index is 59.39 kg/m.  Estimated Nutritional Needs:   Kcal:  3000-3200  Protein:  120-130 g  Fluid:  > 3 L

## 2022-07-15 NOTE — Progress Notes (Signed)
ACULTY PRACTICE ANTEPARTUM COMPREHENSIVE PROGRESS NOTE  Autumn Duarte is a 26 y.o. G2P1002 at [redacted]w[redacted]d  who is admitted for Prom in setting of Di/Di twins.   Fetal presentation is cephalic/cephalic Length of Stay:  8  Days  Subjective: Sore arm soreness from RSV vaccine Patient reports good fetal movement.  She reports no uterine contractions, no bleeding and no loss of fluid per vagina.  Vitals:  Blood pressure 114/68, pulse (!) 119, temperature 99.9 F (37.7 C), temperature source Oral, resp. rate 19, height 5\' 4"  (1.626 m), weight (!) 156.9 kg, last menstrual period 11/11/2021, SpO2 100 %.  Physical Examination: Lungs clear Heart RRR Abd soft + BS gravid non tender GU deferred Ext non tender  Fetal Monitoring:  Baseline: 140-150's bpm, Variability: Good {> 6 bpm), and Accelerations: Reactive x 2  Labs:  No results found for this or any previous visit (from the past 24 hour(s)).  Imaging Studies:    NA   Medications:  Scheduled  aspirin EC  162 mg Oral Daily   docusate sodium  100 mg Oral Daily   prenatal multivitamin  1 tablet Oral Q1200   I have reviewed the patient's current medications.  ASSESSMENT: IUP 33 3/7 weeks DI/Di twins PROM tw A CHTN Anemia related to IUP/twins  PLAN: Stable. BP stable without meds No S/Sx of infection Fetal well being reassuring S/P Venofer IOL at 34 weeks or for maternal/fetal indications Continue routine antenatal care.   5/7 07/15/2022,8:06 AM

## 2022-07-16 LAB — CBC
HCT: 26.9 % — ABNORMAL LOW (ref 36.0–46.0)
HCT: 28.7 % — ABNORMAL LOW (ref 36.0–46.0)
Hemoglobin: 8.9 g/dL — ABNORMAL LOW (ref 12.0–15.0)
Hemoglobin: 9.5 g/dL — ABNORMAL LOW (ref 12.0–15.0)
MCH: 29 pg (ref 26.0–34.0)
MCH: 28.9 pg (ref 26.0–34.0)
MCHC: 33.1 g/dL (ref 30.0–36.0)
MCHC: 33.1 g/dL (ref 30.0–36.0)
MCV: 87.6 fL (ref 80.0–100.0)
MCV: 87.2 fL (ref 80.0–100.0)
Platelets: 209 10*3/uL (ref 150–400)
Platelets: 217 10*3/uL (ref 150–400)
RBC: 3.07 MIL/uL — ABNORMAL LOW (ref 3.87–5.11)
RBC: 3.29 MIL/uL — ABNORMAL LOW (ref 3.87–5.11)
RDW: 15.6 % — ABNORMAL HIGH (ref 11.5–15.5)
RDW: 15.7 % — ABNORMAL HIGH (ref 11.5–15.5)
WBC: 10.4 10*3/uL (ref 4.0–10.5)
WBC: 10.8 10*3/uL — ABNORMAL HIGH (ref 4.0–10.5)
nRBC: 0 % (ref 0.0–0.2)
nRBC: 0 % (ref 0.0–0.2)

## 2022-07-16 MED ORDER — FENTANYL CITRATE (PF) 100 MCG/2ML IJ SOLN: 50.0000 ug | INTRAMUSCULAR | Status: DC | PRN

## 2022-07-16 NOTE — Progress Notes (Addendum)
ACULTY PRACTICE ANTEPARTUM COMPREHENSIVE PROGRESS NOTE  Autumn Duarte is a 26 y.o. G2P1002 at [redacted]w[redacted]d  who is admitted for PPROM in setting of Di/Di twins.   Fetal presentation is cephalic/cephalic Length of Stay:  9  Days  Subjective: Notes cramping and intermittent contractions. No fever.  Patient reports good fetal movement.  She reports no uterine contractions, no bleeding and no loss of fluid per vagina.  Vitals:  Blood pressure (!) 100/51, pulse (!) 113, temperature 98.4 F (36.9 C), temperature source Oral, resp. rate 20, height 5\' 4"  (1.626 m), weight (!) 156.9 kg, last menstrual period 11/11/2021, SpO2 98 %.  Physical Examination: Lungs clear  Heart RRR Abd soft + BS gravid non tender GU deferred Ext non tender  Fetal Monitoring:  Baseline: 140-150's bpm, Variability: Good {> 6 bpm), and Accelerations: Reactive x 2  Labs:  Results for orders placed or performed during the hospital encounter of 07/07/22 (from the past 24 hour(s))  CBC   Collection Time: 07/15/22 10:01 PM  Result Value Ref Range   WBC 10.7 (H) 4.0 - 10.5 K/uL   RBC 3.25 (L) 3.87 - 5.11 MIL/uL   Hemoglobin 9.3 (L) 12.0 - 15.0 g/dL   HCT 07/17/22 (L) 65.6 - 81.2 %   MCV 87.1 80.0 - 100.0 fL   MCH 28.6 26.0 - 34.0 pg   MCHC 32.9 30.0 - 36.0 g/dL   RDW 75.1 70.0 - 17.4 %   Platelets 211 150 - 400 K/uL   nRBC 0.3 (H) 0.0 - 0.2 %  CBC   Collection Time: 07/16/22  4:20 AM  Result Value Ref Range   WBC 10.4 4.0 - 10.5 K/uL   RBC 3.07 (L) 3.87 - 5.11 MIL/uL   Hemoglobin 8.9 (L) 12.0 - 15.0 g/dL   HCT 07/18/22 (L) 96.7 - 59.1 %   MCV 87.6 80.0 - 100.0 fL   MCH 29.0 26.0 - 34.0 pg   MCHC 33.1 30.0 - 36.0 g/dL   RDW 63.8 (H) 46.6 - 59.9 %   Platelets 209 150 - 400 K/uL   nRBC 0.0 0.0 - 0.2 %    Imaging Studies:    NA   Medications:  Scheduled  aspirin EC  162 mg Oral Daily   docusate sodium  100 mg Oral Daily   prenatal multivitamin  1 tablet Oral Q1200   I have reviewed the patient's current  medications.  ASSESSMENT: DI/Di twins PPROM tw A CHTN Anemia related to IUP/twins  PLAN: Stable. BP stable without meds No S/Sx of infection Fetal well being reassuring S/P Venofer IOL at 34 weeks or for maternal/fetal indications Continue routine antenatal care.  Will give Procardia today and a dose of fentanyl to help with rest. Repeat CBC and clinical picture not concerning of chorio or abruption (no fever or VB).    35.7 07/16/2022,12:23 PM

## 2022-07-17 MED ORDER — LACTATED RINGERS IV BOLUS
500.0000 mL | Freq: Once | INTRAVENOUS | Status: AC
  Administered 2022-07-17: 500 mL via INTRAVENOUS

## 2022-07-17 MED ORDER — NIFEDIPINE 10 MG PO CAPS
10.0000 mg | ORAL_CAPSULE | ORAL | Status: DC | PRN
  Administered 2022-07-17: 10 mg via ORAL
  Filled 2022-07-17 (×2): qty 1

## 2022-07-17 NOTE — Progress Notes (Signed)
FACULTY PRACTICE ANTEPARTUM(COMPREHENSIVE) NOTE  Autumn Duarte is a 26 y.o. [id number] with Estimated Date of Delivery: 08/30/22   By  early ultrasound [redacted]w[redacted]d  who is admitted for PPROM with di/di twins.    Fetal presentation is cephalic/cephalic Length of Stay:  10  Days  Date of admission:07/07/2022  Subjective: Pt reporting contractions overnight, initially mild then started to worsen around 5am this am.  SHe is not sure how often she is feeling them. Patient reports the fetal movement as active. Patient reports  vaginal bleeding as none. Patient describes fluid per vagina as Clear.  Vitals:  Blood pressure (!) 93/34, pulse (!) 105, temperature 98.6 F (37 C), temperature source Oral, resp. rate 20, height 5\' 4"  (1.626 m), weight (!) 156.9 kg, last menstrual period 11/11/2021, SpO2 99 %. Vitals:   07/16/22 1605 07/16/22 2003 07/17/22 0401 07/17/22 0723  BP: 126/67 (!) 111/51 (!) 108/53 (!) 93/34  Pulse: (!) 118 (!) 107 (!) 104 (!) 105  Resp: 19 20 20 20   Temp: 98.4 F (36.9 C) 98.6 F (37 C) 98.4 F (36.9 C) 98.6 F (37 C)  TempSrc: Oral Oral Oral Oral  SpO2: 97% 99% 99% 99%  Weight:      Height:       Physical Examination:  General appearance - appears uncomfortable Mental status - normal mood and behavior Chest - CTAB Heart - normal rate and regular rhythm Abdomen - obese, soft and non-tender Pelvic - normal external genitalia.  Sterile speculum placed- normal vaginal mucosa, leaking of clear fluid noted.  Cervix visualized- appears closed/multiparous cervix.  SVE not completed in setting of PPROM Extremities - no calf tenderness bilaterally Skin - warm and dry   Fetal Monitoring:  TWIN A:  Baseline: 150 bpm, Variability: moderate variability, Accelerations: +15x15 accels x 2 present, and Decelerations: Absent   reactive  TWIN B: 140 bpm with moderate variability, +accels x2, no decels.  Reactive Toco: occasional contractions, some irritability noted  Labs:  Results for  orders placed or performed during the hospital encounter of 07/07/22 (from the past 24 hour(s))  Type and screen [facility]   Collection Time: 07/16/22  8:58 PM  Result Value Ref Range   ABO/RH(D) O POS    Antibody Screen NEG    Sample Expiration      07/19/2022,2359 Performed at [facility], [address], [place], [address] [place]   CBC   Collection Time: 07/16/22  8:59 PM  Result Value Ref Range   WBC 10.8 (H) 4.0 - 10.5 K/uL   RBC 3.29 (L) 3.87 - 5.11 MIL/uL   Hemoglobin 9.5 (L) 12.0 - 15.0 g/dL   HCT 45409 (L) 07/18/22 - 81.1 %   MCV 87.2 80.0 - 100.0 fL   MCH 28.9 26.0 - 34.0 pg   MCHC 33.1 30.0 - 36.0 g/dL   RDW 91.4 (H) 78.2 - 95.6 %   Platelets 217 150 - 400 K/uL   nRBC 0.0 0.0 - 0.2 %    Imaging Studies:    No results found.   Medications:  Scheduled  aspirin EC  162 mg Oral Daily   docusate sodium  100 mg Oral Daily   prenatal multivitamin  1 tablet Oral Q1200   I have reviewed the patient's current medications.  ASSESSMENT: 21.3 [redacted]w[redacted]d Estimated Date of Delivery: 08/30/22  Di/Di twins PPROM Chronic HTN Morbid obesity Anemia of pregnancy  PLAN: 1) Fetal well being:  Cat. 1 x2, reactive NST x 2  Continue NST twice daily  2) PPROM -noting contractions this am with no cervical dilation -pt given IV fluids and procardia, will continue to monitor -s/p latency antibiotics -s/p BMZ 127-8 -no evidence of infection  3) Anemia -s/p iron, Hgb as above  4) Chronic HTN BP low normal this am Not on anti-hypertensives  DISPO: Continue antepartum care as outlined above.  Plan for IOL @ 34wks or as clinically indicated based on maternal/fetal status.  Myna Hidalgo, DO Attending Obstetrician & Gynecologist, [facility] for [facility]      egnancy  Autumn Duarte 07/17/2022,7:31 AM

## 2022-07-18 ENCOUNTER — Encounter (HOSPITAL_COMMUNITY): Payer: Self-pay | Admitting: Obstetrics and Gynecology

## 2022-07-18 ENCOUNTER — Other Ambulatory Visit: Payer: Self-pay

## 2022-07-18 DIAGNOSIS — Z8759 Personal history of other complications of pregnancy, childbirth and the puerperium: Secondary | ICD-10-CM

## 2022-07-18 DIAGNOSIS — O30043 Twin pregnancy, dichorionic/diamniotic, third trimester: Secondary | ICD-10-CM

## 2022-07-18 DIAGNOSIS — Z3A33 33 weeks gestation of pregnancy: Secondary | ICD-10-CM

## 2022-07-18 DIAGNOSIS — O1002 Pre-existing essential hypertension complicating childbirth: Secondary | ICD-10-CM

## 2022-07-18 DIAGNOSIS — O42013 Preterm premature rupture of membranes, onset of labor within 24 hours of rupture, third trimester: Secondary | ICD-10-CM

## 2022-07-18 HISTORY — DX: Personal history of other complications of pregnancy, childbirth and the puerperium: Z87.59

## 2022-07-18 LAB — CBC
HCT: 28.8 % — ABNORMAL LOW (ref 36.0–46.0)
HCT: 27.6 % — ABNORMAL LOW (ref 36.0–46.0)
Hemoglobin: 9.8 g/dL — ABNORMAL LOW (ref 12.0–15.0)
Hemoglobin: 9.5 g/dL — ABNORMAL LOW (ref 12.0–15.0)
MCH: 29.3 pg (ref 26.0–34.0)
MCH: 29.6 pg (ref 26.0–34.0)
MCHC: 34 g/dL (ref 30.0–36.0)
MCHC: 34.4 g/dL (ref 30.0–36.0)
MCV: 86 fL (ref 80.0–100.0)
MCV: 86 fL (ref 80.0–100.0)
Platelets: 213 10*3/uL (ref 150–400)
Platelets: 192 10*3/uL (ref 150–400)
RBC: 3.35 MIL/uL — ABNORMAL LOW (ref 3.87–5.11)
RBC: 3.21 MIL/uL — ABNORMAL LOW (ref 3.87–5.11)
RDW: 15.7 % — ABNORMAL HIGH (ref 11.5–15.5)
RDW: 15.7 % — ABNORMAL HIGH (ref 11.5–15.5)
WBC: 11.5 10*3/uL — ABNORMAL HIGH (ref 4.0–10.5)
WBC: 11 10*3/uL — ABNORMAL HIGH (ref 4.0–10.5)
nRBC: 0.3 % — ABNORMAL HIGH (ref 0.0–0.2)
nRBC: 0.2 % (ref 0.0–0.2)

## 2022-07-18 LAB — DIC (DISSEMINATED INTRAVASCULAR COAGULATION)PANEL
D-Dimer, Quant: 1.56 ug/mL-FEU — ABNORMAL HIGH (ref 0.00–0.50)
Fibrinogen: >800 mg/dL — ABNORMAL HIGH (ref 210–475)
INR: 1.2 (ref 0.8–1.2)
Platelets: 192 10*3/uL (ref 150–400)
Prothrombin Time: 14.9 seconds (ref 11.4–15.2)
Smear Review: NONE SEEN
aPTT: 31 seconds (ref 24–36)

## 2022-07-18 LAB — PREPARE RBC (CROSSMATCH)

## 2022-07-18 MED ORDER — FENTANYL CITRATE (PF) 100 MCG/2ML IJ SOLN
100.0000 ug | Freq: Once | INTRAMUSCULAR | Status: AC
  Administered 2022-07-18: 100 ug via INTRAVENOUS

## 2022-07-18 MED ORDER — FENTANYL CITRATE (PF) 100 MCG/2ML IJ SOLN
INTRAMUSCULAR | Status: AC
  Filled 2022-07-18: qty 2

## 2022-07-18 MED ORDER — OXYTOCIN-SODIUM CHLORIDE 30-0.9 UT/500ML-% IV SOLN
INTRAVENOUS | Status: AC
  Administered 2022-07-18: 333 mL
  Filled 2022-07-18: qty 500

## 2022-07-18 MED ORDER — ONDANSETRON HCL 4 MG/2ML IJ SOLN: 4.0000 mg | INTRAMUSCULAR | Status: DC | PRN

## 2022-07-18 MED ORDER — METHYLERGONOVINE MALEATE 0.2 MG/ML IJ SOLN
INTRAMUSCULAR | Status: AC
  Filled 2022-07-18: qty 1

## 2022-07-18 MED ORDER — TETANUS-DIPHTH-ACELL PERTUSSIS 5-2.5-18.5 LF-MCG/0.5 IM SUSY: 0.5000 mL | PREFILLED_SYRINGE | Freq: Once | INTRAMUSCULAR | Status: DC

## 2022-07-18 MED ORDER — SODIUM CHLORIDE 0.9% FLUSH: 9.0000 mL | INTRAVENOUS | Status: DC | PRN

## 2022-07-18 MED ORDER — KETOROLAC TROMETHAMINE 30 MG/ML IJ SOLN
30.0000 mg | Freq: Four times a day (QID) | INTRAMUSCULAR | Status: AC
  Administered 2022-07-18 – 2022-07-19 (×4): 30 mg via INTRAVENOUS
  Filled 2022-07-18 (×4): qty 1

## 2022-07-18 MED ORDER — LACTATED RINGERS IV BOLUS
1000.0000 mL | Freq: Once | INTRAVENOUS | Status: AC
  Administered 2022-07-18: 1000 mL via INTRAVENOUS

## 2022-07-18 MED ORDER — TRANEXAMIC ACID-NACL 1000-0.7 MG/100ML-% IV SOLN
INTRAVENOUS | Status: AC
  Filled 2022-07-18: qty 100

## 2022-07-18 MED ORDER — TRANEXAMIC ACID-NACL 1000-0.7 MG/100ML-% IV SOLN
1000.0000 mg | INTRAVENOUS | Status: AC
  Administered 2022-07-18: 1000 mg via INTRAVENOUS

## 2022-07-18 MED ORDER — CEFAZOLIN IN SODIUM CHLORIDE 3-0.9 GM/100ML-% IV SOLN
3.0000 g | INTRAVENOUS | Status: AC
  Administered 2022-07-18: 3 g via INTRAVENOUS
  Filled 2022-07-18 (×2): qty 100

## 2022-07-18 MED ORDER — DIPHENHYDRAMINE HCL 25 MG PO CAPS: 25.0000 mg | ORAL_CAPSULE | Freq: Four times a day (QID) | ORAL | Status: DC | PRN

## 2022-07-18 MED ORDER — ZOLPIDEM TARTRATE 5 MG PO TABS: 5.0000 mg | ORAL_TABLET | Freq: Every evening | ORAL | Status: DC | PRN

## 2022-07-18 MED ORDER — DIPHENHYDRAMINE HCL 12.5 MG/5ML PO ELIX: 12.5000 mg | ORAL_SOLUTION | Freq: Four times a day (QID) | ORAL | Status: DC | PRN

## 2022-07-18 MED ORDER — LIDOCAINE HCL URETHRAL/MUCOSAL 2 % EX GEL
1.0000 | Freq: Once | CUTANEOUS | Status: AC
  Administered 2022-07-18: 1
  Filled 2022-07-18: qty 6

## 2022-07-18 MED ORDER — SODIUM CHLORIDE 0.9% IV SOLUTION: Freq: Once | INTRAVENOUS | Status: DC

## 2022-07-18 MED ORDER — NALOXONE HCL 0.4 MG/ML IJ SOLN: 0.4000 mg | INTRAMUSCULAR | Status: DC | PRN

## 2022-07-18 MED ORDER — ACETAMINOPHEN 325 MG PO TABS
650.0000 mg | ORAL_TABLET | ORAL | Status: DC | PRN
  Administered 2022-07-19 (×2): 650 mg via ORAL
  Filled 2022-07-18 (×2): qty 2

## 2022-07-18 MED ORDER — OXYCODONE HCL 5 MG PO TABS
10.0000 mg | ORAL_TABLET | Freq: Once | ORAL | Status: AC
  Administered 2022-07-18: 10 mg via ORAL
  Filled 2022-07-18: qty 2

## 2022-07-18 MED ORDER — METHYLERGONOVINE MALEATE 0.2 MG PO TABS: 0.2000 mg | ORAL_TABLET | ORAL | Status: DC | PRN

## 2022-07-18 MED ORDER — COCONUT OIL OIL: 1.0000 | TOPICAL_OIL | Status: DC | PRN

## 2022-07-18 MED ORDER — PRENATAL MULTIVITAMIN CH
1.0000 | ORAL_TABLET | Freq: Every day | ORAL | Status: DC
  Administered 2022-07-19 – 2022-07-20 (×2): 1 via ORAL
  Filled 2022-07-18 (×2): qty 1

## 2022-07-18 MED ORDER — ONDANSETRON HCL 4 MG/2ML IJ SOLN: 4.0000 mg | Freq: Four times a day (QID) | INTRAMUSCULAR | Status: DC | PRN

## 2022-07-18 MED ORDER — FENTANYL 50 MCG/ML IV PCA SOLN: INTRAVENOUS | Status: DC

## 2022-07-18 MED ORDER — SENNOSIDES-DOCUSATE SODIUM 8.6-50 MG PO TABS
2.0000 | ORAL_TABLET | ORAL | Status: DC
  Administered 2022-07-19: 2 via ORAL
  Filled 2022-07-18: qty 2

## 2022-07-18 MED ORDER — IBUPROFEN 600 MG PO TABS: 600.0000 mg | ORAL_TABLET | Freq: Four times a day (QID) | ORAL | Status: DC

## 2022-07-18 MED ORDER — METHYLERGONOVINE MALEATE 0.2 MG/ML IJ SOLN
0.2000 mg | Freq: Once | INTRAMUSCULAR | Status: AC
  Administered 2022-07-18: 0.2 mg via INTRAMUSCULAR

## 2022-07-18 MED ORDER — METHYLERGONOVINE MALEATE 0.2 MG/ML IJ SOLN: 0.2000 mg | INTRAMUSCULAR | Status: DC | PRN

## 2022-07-18 MED ORDER — DIPHENHYDRAMINE HCL 50 MG/ML IJ SOLN: 12.5000 mg | Freq: Four times a day (QID) | INTRAMUSCULAR | Status: DC | PRN

## 2022-07-18 MED ORDER — BENZOCAINE-MENTHOL 20-0.5 % EX AERO
1.0000 | INHALATION_SPRAY | CUTANEOUS | Status: DC | PRN
  Administered 2022-07-19: 1 via TOPICAL
  Filled 2022-07-18: qty 56

## 2022-07-18 MED ORDER — MISOPROSTOL 200 MCG PO TABS
1000.0000 ug | ORAL_TABLET | Freq: Once | ORAL | Status: AC
  Administered 2022-07-18: 1000 ug via RECTAL

## 2022-07-18 MED ORDER — DIBUCAINE (PERIANAL) 1 % EX OINT: 1.0000 | TOPICAL_OINTMENT | CUTANEOUS | Status: DC | PRN

## 2022-07-18 MED ORDER — MISOPROSTOL 200 MCG PO TABS
ORAL_TABLET | ORAL | Status: AC
  Filled 2022-07-18: qty 5

## 2022-07-18 MED ORDER — ONDANSETRON HCL 4 MG PO TABS: 4.0000 mg | ORAL_TABLET | ORAL | Status: DC | PRN

## 2022-07-18 MED ORDER — WITCH HAZEL-GLYCERIN EX PADS
1.0000 | MEDICATED_PAD | CUTANEOUS | Status: DC | PRN
  Administered 2022-07-19: 1 via TOPICAL

## 2022-07-18 MED ORDER — SIMETHICONE 80 MG PO CHEW: 80.0000 mg | CHEWABLE_TABLET | ORAL | Status: DC | PRN

## 2022-07-18 NOTE — Progress Notes (Addendum)
At bedside, pt reporting painful contractions that started after her shower.  O: BP (!) 108/57 (BP Location: Right Wrist)   Pulse (!) 105   Temp 98.1 F (36.7 C) (Oral)   Resp 18   Ht 5\' 4"  (1.626 m)   Wt (!) 156.9 kg   LMP 11/11/2021   SpO2 98%   BMI 59.39 kg/m   Twin A: 150, moderate variability, + accels, no decels Twin B: 140, moderate variability, + accels, no decels  Toco: q 11/13/2021  SVE: 5-6/80/-2, vertex on exam  A/P 26yo G1P0 @ [redacted]w[redacted]d admitted for PPROM  -FWB- Cat. 1 x 2 -Plan to transfer to L&D for labor -GBS negative -epidural if desired  Discussed vaginal vs C-section.  Reviewed that while both babies are currently vertex discussed potential for breech extraction.  Also discussed need for emergent C-section.  Questions/concerns were addressed and she desires vaginal delivery.  [redacted]w[redacted]d, DO Attending Obstetrician & Gynecologist, [facility] for [facility]

## 2022-07-18 NOTE — Progress Notes (Signed)
2238 ml total blood loss at this time

## 2022-07-18 NOTE — Progress Notes (Signed)
120 cc saline removed from [place].

## 2022-07-18 NOTE — Lactation Note (Signed)
This note was copied from a baby's chart.  NICU Lactation Consultation Note  Patient Name: Autumn Duarte [id number] Date: 07/18/2022 Age:26 years  Subjective Reason for consult: Initial assessment; NICU baby; Preterm <34wks; Multiple gestation; Infant < 6lbs  Lactation conducted initial visit with Autumn Duarte on OBSC. Parent wishes to breastfeed and to pump and would like to start as soon as she is physically able. I provided pump set up and basic pump education. We were unable to initiate pumping at this time due to maternal acuity. I encouraged Autumn Duarte to pump as soon as she is physically able.   Autumn Duarte states that she would like to pump and then breastfeed once the twins are ready. She breast fed and pumped for her first set of twins for six months. They are now three years old.   Autumn Duarte states that she has ordered a breast pump from insurance and it is meant to be delivered today.  Objective Infant data: Mother's Current Feeding Choice: Breast Milk and Donor Milk  Infant feeding assessment Scale for Readiness: 2  Maternal data: S2G3151  Vaginal, Spontaneous Current breast feeding challenges:: NICU; twins  Previous breastfeeding challenges?: Other (Comment) (multiple gestation; PPH)  Does the patient have breastfeeding experience prior to this delivery?: Yes How long did the patient breastfeed?: 6 months  Pumping frequency: recommended q3 hours once parent feels physically able Flange Size: 24  Risk factor for low milk supply:: PPH   Pump: Personal (a personal pump has been ordered and is set to arrive today)  Assessment  Feeding Status: NPO   Intervention/Plan Interventions: Breast feeding basics reviewed; Education; DEBP  Tools: Pump; Flanges Pump Education: Setup, frequency, and cleaning  Plan: Consult Status: NICU follow-up  NICU Follow-up type: New admission follow up; Verify onset of copious milk; Verify absence of engorgement    Walker Shadow 07/18/2022,  3:45 PM

## 2022-07-18 NOTE — Progress Notes (Signed)
NICU team at delivery

## 2022-07-18 NOTE — Discharge Summary (Signed)
Postpartum Discharge Summary  Date of Service updated***     Patient Name: Autumn Duarte DOB: 1996/02/23 MRN: [id number]  Date of admission: 07/07/2022 Delivery date:   Jakeria, Caissie [[id number]]  07/18/2022    Evaluna, Utke [[id number]]  07/18/2022  Delivering provider:    Madalina, Rosman [[id number]]  Jalasia, Eskridge [[id number]]  Concepcion Living  Date of discharge: 07/18/2022  Admitting diagnosis: Premature rupture of membranes [O42.90] Intrauterine pregnancy: [redacted]w[redacted]d    Secondary diagnosis:  Principal Problem:   Premature rupture of membranes Active Problems:   Dichorionic diamniotic twin gestation   Maternal morbid obesity, antepartum [place]   Chronic hypertension affecting pregnancy   Supervision of high risk pregnancy, antepartum   Preterm premature rupture of membranes (PPROM) with unknown onset of labor   Postpartum hemorrhage  Additional problems: ***    Discharge diagnosis: {DX.:23714}                                              Post partum procedures:blood transfusion Augmentation:  None Complications: [id number] Hospital course: Onset of Labor With Vaginal Delivery      26y.o. yo G2P1002 at 358w6das admitted in Latent Labor on 07/07/2022. Labor course was complicated by antepartum admission for 10 days followed by preterm delivery of twins followed by postpartum hemorrhage.   Membrane Rupture Time/Date:    RaTamirra, Sienkiewicz0[[id number]]1:25 AM    RaZenia, Guest0[[id number]]8:46 AM ,   RaRani, IdlerhBaylor Scott & White [facility]0[[id number]]07/07/2022    RaOliver, Neuwirth0[[id number]]07/18/2022   Delivery Method:   RaDerrell Lolling0[[id number]]Vaginal, Spontaneous    RaAinslie, Mazurek0[[id number]]Vaginal, Spontaneous  Episiotomy:    RaMadi, BonfigliohCapital Endoscopy LLC0[[id number]]None    RaLailana, Shira0[[id number]]None  Lacerations:     RaTedi, Hughson0[[id number]]None    RaTrinitie, Mcgirr0[[id number]]None  Patient had  a postpartum course complicated by ***.  She is ambulating, tolerating a regular diet, passing flatus, and urinating well. Patient is discharged home in stable condition on 07/18/22.  Newborn Data: Birth date:   RaAcacia, Latorre0[[id number]]07/18/2022    RaJaionna, Weisse0[[id number]]07/18/2022  Birth time:   RaZaleigh, Bermingham0[[id number]]8:13 AM    RaNeldon [place]0[[id number]]8:49 AM  Gender:   RaGrasiela, Jonsson0[[id number]]Female    RaAndree, Heeg0[[id number]]Female  Living status:   RaNefertiti, Mohamad0[[id number]]Living    RaArielis, LeonharthCentral Ma Ambulatory [facility]oscopy Center0[[id number]]Living  Apgars:   RaDaylin, EadshEast [facility]rology [facility]ro[facility]0[[id number]]3 [address]0[[id number]]   ,   OQH, UTMLhJefferson Community Health Center0[[id number]]9 [address] [facility]0[[id number]]  Weight:   RaDaquana, Paddock0[[id number]]  7591    RaRoxan, YamamotohCrystal Run Ambulatory Surgery0[[id number]]2460 g   Magnesium Sulfate received: {Mag received:30440022} BMZ received: {BMZ received:30440023} Rhophylac:{Rhophylac received:30440032} MMJTT:{SVX:79390300}-DaP:{Tdap:23962} Flu: {F{PQZ:30076}ransfusion:{Transfusion received:30440034}  Physical exam  Vitals:   07/18/22 0915 07/18/22 0920 07/18/22 0925 07/18/22 0930  BP: 117/84 (!) 133/57 (!) 101/55 (!) 110/51  Pulse: (!) 112 (!) 103 99 (!) 104  Resp:  Temp:      TempSrc:      SpO2:      Weight:      Height:       General: {Exam; general:21111117} Lochia: {Desc; appropriate/inappropriate:30686::"appropriate"} Uterine Fundus: {Desc; firm/soft:30687} Incision: {Exam; incision:21111123} DVT Evaluation: {Exam; WEX:9371696} Labs: Lab Results  Component Value Date   WBC 11.0 (H) 07/18/2022   HGB 9.5 (L) 07/18/2022   HCT 27.6 (L) 07/18/2022   MCV 86.0 07/18/2022   PLT 192 07/18/2022      Latest Ref Rng & Units 02/11/2022   11:16 AM  CMP  Glucose 70 - 99 mg/dL 95   BUN 6 - 20 mg/dL 8   Creatinine 0.57 - 1.00 mg/dL 0.64   Sodium 134 - 144 mmol/L 137   Potassium 3.5 - 5.2 mmol/L 4.0    Chloride 96 - 106 mmol/L 103   CO2 20 - 29 mmol/L 21   Calcium 8.7 - 10.2 mg/dL 9.1   Total Protein 6.0 - 8.5 g/dL 6.8   Total Bilirubin 0.0 - 1.2 mg/dL 0.3   Alkaline Phos 44 - 121 IU/L 29   AST 0 - 40 IU/L 11   ALT 0 - 32 IU/L 16    Edinburgh Score:    12/05/2018    6:33 PM  Edinburgh Postnatal Depression Scale Screening Tool  I have been able to laugh and see the funny side of things. 0  I have looked forward with enjoyment to things. 0  I have blamed myself unnecessarily when things went wrong. 2  I have been anxious or worried for no good reason. 2  I have felt scared or panicky for no good reason. 2  Things have been getting on top of me. 0  I have been so unhappy that I have had difficulty sleeping. 0  I have felt sad or miserable. 0  I have been so unhappy that I have been crying. 0  The thought of harming myself has occurred to me. 0  Edinburgh Postnatal Depression Scale Total 6     After visit meds:  Allergies as of 07/18/2022       Reactions   Shellfish Allergy Itching, Shortness Of Breath, Hives     Med Rec must be completed prior to using this [facility])***        Discharge home in stable condition Infant Feeding: {Baby feeding:23562} Infant Disposition:{CHL IP OB HOME WITH VELFYB:01751} Discharge instruction: per After Visit Summary and Postpartum booklet. Activity: Advance as tolerated. Pelvic rest for 6 weeks.  Diet: {OB WCHE:52778242} Future Appointments: Future Appointments  Date Time Provider [place]  07/21/2022  6:30 AM MC-LD [place] MC-INDC None  07/21/2022 10:15 AM Griffin Basil, MD [facility]  08/04/2022  3:15 PM Clarnce Flock, MD [facility]  08/12/2022 11:15 AM Aletha Halim, MD [facility]  08/18/2022  1:15 PM Griffin Basil, MD [facility]  08/25/2022 10:55 AM Clarnce Flock, MD [facility]   Follow up Visit: Message sent   Please schedule this patient for a In person postpartum visit in 4 weeks with the  following provider: Any provider. Additional Postpartum F/U:BP check 1 week  High risk pregnancy complicated by: HTN and di-di twins  Delivery mode:     Jazsmine, Macari [[id number]]  Vaginal, Spontaneous    Walterine, Amodei [[id number]]  Vaginal, Spontaneous  Anticipated Birth Control:  Plans Interval BTL   07/18/2022 Concepcion Living, MD

## 2022-07-18 NOTE — Progress Notes (Signed)
Code hemorrhage initiated

## 2022-07-18 NOTE — Progress Notes (Signed)
Jada and foley removed.   Normal amount of lochia observed.  Continue analgesia prn and close observation.   Jaynie Collins, MD

## 2022-07-19 LAB — TYPE AND SCREEN
ABO/RH(D): O POS
Antibody Screen: NEGATIVE
Unit division: 0
Unit division: 0

## 2022-07-19 LAB — COMPREHENSIVE METABOLIC PANEL
ALT: 15 U/L (ref 0–44)
AST: 22 U/L (ref 15–41)
Albumin: 1.9 g/dL — ABNORMAL LOW (ref 3.5–5.0)
Alkaline Phosphatase: 64 U/L (ref 38–126)
Anion gap: 11 (ref 5–15)
BUN: 7 mg/dL (ref 6–20)
CO2: 20 mmol/L — ABNORMAL LOW (ref 22–32)
Calcium: 9.2 mg/dL (ref 8.9–10.3)
Chloride: 106 mmol/L (ref 98–111)
Creatinine, Ser: 0.74 mg/dL (ref 0.44–1.00)
GFR, Estimated: >60 mL/min (ref >60)
Glucose, Bld: 151 mg/dL — ABNORMAL HIGH (ref 70–99)
Potassium: 3.6 mmol/L (ref 3.5–5.1)
Sodium: 137 mmol/L (ref 135–145)
Total Bilirubin: 0.4 mg/dL (ref 0.3–1.2)
Total Protein: 5.3 g/dL — ABNORMAL LOW (ref 6.5–8.1)

## 2022-07-19 LAB — CBC
HCT: 28.8 % — ABNORMAL LOW (ref 36.0–46.0)
Hemoglobin: 9.5 g/dL — ABNORMAL LOW (ref 12.0–15.0)
MCH: 28.8 pg (ref 26.0–34.0)
MCHC: 33 g/dL (ref 30.0–36.0)
MCV: 87.3 fL (ref 80.0–100.0)
Platelets: 215 10*3/uL (ref 150–400)
RBC: 3.3 MIL/uL — ABNORMAL LOW (ref 3.87–5.11)
RDW: 15.4 % (ref 11.5–15.5)
WBC: 9.2 10*3/uL (ref 4.0–10.5)
nRBC: 0.2 % (ref 0.0–0.2)

## 2022-07-19 LAB — BPAM RBC
Blood Product Expiration Date: 202401122359
Blood Product Expiration Date: 202401122359
ISSUE DATE / TIME: 202312180913
ISSUE DATE / TIME: 202312180913
Unit Type and Rh: 5100
Unit Type and Rh: 5100

## 2022-07-19 LAB — BPAM FFP
Blood Product Expiration Date: 202312232359
ISSUE DATE / TIME: 202312181030
Unit Type and Rh: 6200

## 2022-07-19 LAB — PREPARE FRESH FROZEN PLASMA: Unit division: 0

## 2022-07-19 MED ORDER — SERTRALINE HCL 50 MG PO TABS
50.0000 mg | ORAL_TABLET | Freq: Every day | ORAL | Status: DC
  Administered 2022-07-19 – 2022-07-20 (×2): 50 mg via ORAL
  Filled 2022-07-19 (×2): qty 1

## 2022-07-19 MED ORDER — FERROUS SULFATE 325 (65 FE) MG PO TABS
325.0000 mg | ORAL_TABLET | ORAL | Status: DC
  Administered 2022-07-19: 325 mg via ORAL
  Filled 2022-07-19: qty 1

## 2022-07-19 NOTE — Progress Notes (Signed)
Post Partum Day 1 s/p VD of twins complicated by PPH, acute blood loss anemia  Subjective: No complaints, up ad lib, voiding, tolerating PO, and + flatus. Babies are stable in NICU.  Objective: Blood pressure 110/70, pulse 81, temperature 97.9 F (36.6 C), temperature source Oral, resp. rate 19, height 5\' 4"  (1.626 m), weight (!) 156.9 kg, last menstrual period 11/11/2021, SpO2 99 %, unknown if currently breastfeeding.  Physical Exam:  General: alert and moderate distress Lochia: appropriate Uterine Fundus: firm DVT Evaluation: No evidence of DVT seen on physical exam. Negative Homan's sign. No cords or calf tenderness.  No significant calf/ankle edema.  Labs:    Latest Ref Rng & Units 07/19/2022    5:31 AM 07/18/2022    9:18 AM 07/18/2022    9:17 AM  CBC  WBC 4.0 - 10.5 K/uL 9.2  11.0    Hemoglobin 12.0 - 15.0 g/dL 9.5  9.5    Hematocrit 36.0 - 46.0 % 28.8  27.6    Platelets 150 - 400 K/uL 215  192  192    Assessment/Plan: Hemoglobin is stable s/p 2 PRBCs, 1 FFP. Will continue oral iron therapy for now. BP is stable, no meds needed for her history of CHTN Encourage OOB, analgesia as needed Routine postpartum care.   LOS: 12 days   07/20/2022, MD 07/19/2022, 9:31 AM

## 2022-07-19 NOTE — Progress Notes (Signed)
Patient screened out for psychosocial assessment since none of the following apply:  Psychosocial stressors documented in mother or baby's chart  Gestation less than 32 weeks  Code at delivery   Infant with anomalies Please contact the Clinical Social Worker if specific needs arise, by MOB's request, or if MOB scores greater than 9/yes to question 10 on Edinburgh Postpartum Depression Screen.  Sande Pickert, LCSW Clinical Social Worker [facility] Cell#: [phone]     

## 2022-07-20 ENCOUNTER — Other Ambulatory Visit (HOSPITAL_COMMUNITY): Payer: Self-pay

## 2022-07-20 DIAGNOSIS — D62 Acute posthemorrhagic anemia: Secondary | ICD-10-CM | POA: Diagnosis present

## 2022-07-20 LAB — SURGICAL PATHOLOGY

## 2022-07-20 MED ORDER — FUROSEMIDE 20 MG PO TABS
20.0000 mg | ORAL_TABLET | Freq: Every day | ORAL | 0 refills | Status: DC
  Filled 2022-07-20: qty 5, 5d supply, fill #0

## 2022-07-20 MED ORDER — IBUPROFEN 600 MG PO TABS
600.0000 mg | ORAL_TABLET | Freq: Four times a day (QID) | ORAL | 2 refills | Status: DC | PRN
  Filled 2022-07-20: qty 60, 15d supply, fill #0

## 2022-07-20 MED ORDER — FUROSEMIDE 20 MG PO TABS
20.0000 mg | ORAL_TABLET | Freq: Every day | ORAL | Status: DC
  Administered 2022-07-20: 20 mg via ORAL
  Filled 2022-07-20: qty 1

## 2022-07-21 ENCOUNTER — Encounter: Payer: Self-pay | Admitting: Obstetrics and Gynecology

## 2022-07-21 ENCOUNTER — Encounter (HOSPITAL_COMMUNITY): Payer: Medicaid Other

## 2022-07-21 ENCOUNTER — Inpatient Hospital Stay (HOSPITAL_COMMUNITY): Payer: Medicaid Other

## 2022-07-21 ENCOUNTER — Inpatient Hospital Stay (HOSPITAL_COMMUNITY)
Admission: AD | Admit: 2022-07-21 | Payer: Medicaid Other | Source: Home / Self Care | Admitting: Obstetrics and Gynecology

## 2022-07-22 ENCOUNTER — Ambulatory Visit: Payer: Medicaid Other

## 2022-07-24 ENCOUNTER — Encounter: Payer: Self-pay | Admitting: Obstetrics & Gynecology

## 2022-07-27 ENCOUNTER — Ambulatory Visit: Payer: Medicaid Other

## 2022-07-27 ENCOUNTER — Ambulatory Visit: Payer: Self-pay

## 2022-07-27 NOTE — Lactation Note (Signed)
This note was copied from a baby's chart.  NICU Lactation Consultation Note  Patient Name: Autumn Duarte [id number] Date: 07/27/2022 Age:26 years   Subjective Reason for consult: Follow-up assessment; NICU baby; Preterm <34wks; Multiple gestation  Lactation followed up with Autumn Duarte in NICU. She states that her daughter, Autumn Duarte, is showing emerging feeding cues, including waking at feeding times and use of pacifier. Parent is interested in breastfeeding and bottle feeding (combination).  We made an appointment for lick and learn for 0900 on Thursday.   We also discussed pumping practices. Autumn Duarte is producing 120 mls/session, but is pumping q6 hours. I reviewed pumping guidelines and recommended q3 hours for optimal milk production. I also recommended that she use a manual pump AFTER using her Zomee pump at home for 5-10 minutes/breast to fully empty the breast. I recommend that she use the Zomee for about 15-20 minutes.  Autumn Duarte denies discomfort with pumping and feels that the size 21 flanges are appropriate.  Objective Infant data: Mother's Current Feeding Choice: Breast Milk and Donor Milk  Infant feeding assessment Scale for Readiness: 2  Maternal data: Q6P6195  Vaginal, Spontaneous Current breast feeding challenges:: Twins/NICU  Pumping frequency: q6 hours/4 times/day Pumped volume: 120 mL Flange Size: 21  Risk factor for low milk supply:: puming frequency is below recommended limits   Pump: Personal, Hands Free, Manual (Zomee)  Intervention/Plan Interventions: Breast feeding basics reviewed; Education  Pump Education: Setup, frequency, and cleaning  Plan: Consult Status: NICU follow-up  NICU Follow-up type: Weekly NICU follow up    Walker Shadow 07/27/2022, 9:43 AM

## 2022-07-28 ENCOUNTER — Telehealth (HOSPITAL_COMMUNITY): Payer: Self-pay

## 2022-07-28 NOTE — Telephone Encounter (Signed)
Patient did not answer phone call. Voicemail left for patient.   Autumn Duarte [facility] 07/28/22,1328

## 2022-07-28 NOTE — Telephone Encounter (Signed)
Patient reports that she is doing ok. Patient asks if RN can call her back. RN will attempt call again at a later time.  Marcelino Duster [facility] 07/28/22,1300

## 2022-07-31 ENCOUNTER — Emergency Department (HOSPITAL_BASED_OUTPATIENT_CLINIC_OR_DEPARTMENT_OTHER)
Admission: EM | Admit: 2022-07-31 | Discharge: 2022-07-31 | Disposition: A | Discharge status: Home / Self Care | Payer: Medicaid Other | Attending: Emergency Medicine | Admitting: Emergency Medicine

## 2022-07-31 ENCOUNTER — Emergency Department (HOSPITAL_BASED_OUTPATIENT_CLINIC_OR_DEPARTMENT_OTHER): Payer: Medicaid Other

## 2022-07-31 ENCOUNTER — Other Ambulatory Visit: Payer: Self-pay

## 2022-07-31 ENCOUNTER — Encounter (HOSPITAL_BASED_OUTPATIENT_CLINIC_OR_DEPARTMENT_OTHER): Payer: Self-pay | Admitting: Emergency Medicine

## 2022-07-31 DIAGNOSIS — R1013 Epigastric pain: Secondary | ICD-10-CM | POA: Insufficient documentation

## 2022-07-31 DIAGNOSIS — R110 Nausea: Secondary | ICD-10-CM | POA: Insufficient documentation

## 2022-07-31 DIAGNOSIS — K828 Other specified diseases of gallbladder: Secondary | ICD-10-CM | POA: Diagnosis not present

## 2022-07-31 DIAGNOSIS — R109 Unspecified abdominal pain: Secondary | ICD-10-CM | POA: Diagnosis present

## 2022-07-31 LAB — COMPREHENSIVE METABOLIC PANEL
ALT: 21 U/L (ref 0–44)
AST: 22 U/L (ref 15–41)
Albumin: 3.1 g/dL — ABNORMAL LOW (ref 3.5–5.0)
Alkaline Phosphatase: 45 U/L (ref 38–126)
Anion gap: 7 (ref 5–15)
BUN: 13 mg/dL (ref 6–20)
CO2: 24 mmol/L (ref 22–32)
Calcium: 8.7 mg/dL — ABNORMAL LOW (ref 8.9–10.3)
Chloride: 108 mmol/L (ref 98–111)
Creatinine, Ser: 0.77 mg/dL (ref 0.44–1.00)
GFR, Estimated: >60 mL/min (ref >60)
Glucose, Bld: 95 mg/dL (ref 70–99)
Potassium: 3.6 mmol/L (ref 3.5–5.1)
Sodium: 139 mmol/L (ref 135–145)
Total Bilirubin: 0.4 mg/dL (ref 0.3–1.2)
Total Protein: 7.4 g/dL (ref 6.5–8.1)

## 2022-07-31 LAB — CBC WITH DIFFERENTIAL/PLATELET
Abs Immature Granulocytes: 0.02 10*3/uL (ref 0.00–0.07)
Basophils Absolute: 0 10*3/uL (ref 0.0–0.1)
Basophils Relative: 0 %
Eosinophils Absolute: 0.1 10*3/uL (ref 0.0–0.5)
Eosinophils Relative: 1 %
HCT: 35.3 % — ABNORMAL LOW (ref 36.0–46.0)
Hemoglobin: 11.6 g/dL — ABNORMAL LOW (ref 12.0–15.0)
Immature Granulocytes: 0 %
Lymphocytes Relative: 32 %
Lymphs Abs: 2.5 10*3/uL (ref 0.7–4.0)
MCH: 28.4 pg (ref 26.0–34.0)
MCHC: 32.9 g/dL (ref 30.0–36.0)
MCV: 86.5 fL (ref 80.0–100.0)
Monocytes Absolute: 0.4 10*3/uL (ref 0.1–1.0)
Monocytes Relative: 5 %
Neutro Abs: 4.9 10*3/uL (ref 1.7–7.7)
Neutrophils Relative %: 62 %
Platelets: 352 10*3/uL (ref 150–400)
RBC: 4.08 MIL/uL (ref 3.87–5.11)
RDW: 14.6 % (ref 11.5–15.5)
WBC: 7.9 10*3/uL (ref 4.0–10.5)
nRBC: 0 % (ref 0.0–0.2)

## 2022-07-31 LAB — HCG, SERUM, QUALITATIVE: Preg, Serum: POSITIVE — AB

## 2022-07-31 LAB — LIPASE, BLOOD: Lipase: 27 U/L (ref 11–51)

## 2022-07-31 MED ORDER — SODIUM CHLORIDE 0.9 % IV BOLUS
1000.0000 mL | Freq: Once | INTRAVENOUS | Status: AC
  Administered 2022-07-31: 1000 mL via INTRAVENOUS

## 2022-07-31 MED ORDER — ONDANSETRON HCL 4 MG PO TABS: 4.0000 mg | ORAL_TABLET | Freq: Four times a day (QID) | ORAL | 0 refills | Status: DC

## 2022-07-31 MED ORDER — ONDANSETRON HCL 4 MG/2ML IJ SOLN
4.0000 mg | Freq: Once | INTRAMUSCULAR | Status: AC
  Administered 2022-07-31: 4 mg via INTRAVENOUS
  Filled 2022-07-31: qty 2

## 2022-07-31 MED ORDER — IOHEXOL 300 MG/ML  SOLN
80.0000 mL | Freq: Once | INTRAMUSCULAR | Status: AC | PRN
  Administered 2022-07-31: 80 mL via INTRAVENOUS

## 2022-07-31 MED ORDER — FAMOTIDINE 20 MG PO TABS: 20.0000 mg | ORAL_TABLET | Freq: Every day | ORAL | 0 refills | Status: DC

## 2022-07-31 NOTE — ED Notes (Signed)
Patient transported to Ultrasound 

## 2022-07-31 NOTE — ED Triage Notes (Signed)
Pt states she woke up with nausea and epigastric pain. During triage pt vomited a large amount of pink emesis. Pt stated she ate "hot chips and hot chocolate" before bed. (And confirms hot chips were pink/red). She denies SHOB at this time and states that she feels "some better" now after vomiting, but is still having pain. Denies fever. Pt denies other sx. She does mention her bleeding has recently increased from delivering twins on 12/18. Pt was PP hemorrhage and was given methergine, TXA and blood tx per her chart. Pt did note that her activity level increased around the same time as the increase in her bleeding.

## 2022-07-31 NOTE — Discharge Instructions (Addendum)
You were seen in the emergency department for your abdominal pain and your vomiting.  You did have sludge in your gallbladder but no gallbladder stones and no signs of gallbladder infection.  It is unclear if your gallbladder is what was causing your pain or it may have been severe acid reflux.  I have given you some nausea medication that you can continue to take as needed as well as an antacid that you can take daily if for at least the next 2 weeks to see if it is helping with your symptoms.  You can take additional Tylenol as needed for pain.  Your CAT scan showed concern there may have been retained products in your uterus from your recent delivery, however on ultrasound there were no signs of retained products, see you can follow-up with your OB/GYN doctor as planned.  You should return to the emergency department for significantly worsening pain, repetitive vomiting despite the nausea medication, fevers or if you have any other new or concerning symptoms.

## 2022-07-31 NOTE — ED Provider Notes (Signed)
Patient signed out to me at 0700 by Dr. Judd Lien pending ultrasound.  In short this is a 26 year old female who is 3 weeks postpartum from twin delivery that was complicated by postpartum hemorrhage presenting to the emergency department with epigastric abdominal pain with nausea and vomiting.  Workup performed last night showed no significant electrolyte abnormality and no hepatic dysfunction.  CT abdomen and pelvis showed concern for possible biliary stones as well as concern for retained products of conception.  Plan is for ultrasound of the right upper quadrant as well as a pelvic ultrasound to further evaluate for cholelithiasis or cholecystitis as well as retained products.  Patient has remained hemodynamically stable overnight.  On my exam, the patient was asleep in bed resting comfortably, easily arousable to verbal stimulation.  Abdomen is soft and nontender.  Clinical Course as of 07/31/22 1011  Sun Jul 31, 2022  4680 Right upper quadrant ultrasound with gallbladder sludge, no evidence of stones making cholelithiasis or cholecystitis less likely.  Transvaginal ultrasound shows a thickened endometrium but no evidence of retained products. [VK]  1011 Pain and nausea are well-controlled and patient is stable for discharge home with primary care and OBGYN follow-up [VK]    Clinical Course User Index [VK] Rexford Maus, DO      Rexford Maus, DO 07/31/22 1011

## 2022-07-31 NOTE — ED Provider Notes (Signed)
[facility] EMERGENCY DEPARTMENT Provider Note   CSN: [id number] Arrival date & time: 07/31/22  0341     History  Chief Complaint  Patient presents with   Abdominal Pain    Autumn Duarte is a 26 y.o. female.  Patient is a 26 year old female presenting with complaints of epigastric pain.  Patient reports waking up feeling nauseated short of breath.  She denies fevers or chills.  Patient had large volume emesis shortly after arriving to the ER, but no vomiting at home.  She reports eating spicy potato chips and hot chocolate prior to going to bed.  She is 3 weeks status post twin delivery complicated by postpartum hemorrhage requiring blood transfusion.  The history is provided by the patient.       Home Medications Prior to Admission medications   Medication Sig Start Date End Date Taking? Authorizing Provider  Blood Pressure Monitoring DEVI 1 each by Does not apply route once a week. 01/18/22   Bagley Bing, MD  cyclobenzaprine (FLEXERIL) 10 MG tablet Take 1 tablet (10 mg total) by mouth 2 (two) times daily as needed for muscle spasms. 01/23/22   Rolm Bookbinder, CNM  docusate sodium (COLACE) 100 MG capsule Take 1 capsule (100 mg total) by mouth 2 (two) times daily. 01/23/22   Rolm Bookbinder, CNM  ferrous sulfate 325 (65 FE) MG tablet Take 1 tablet (325 mg total) by mouth every other day. 06/09/22   Cresenzo, Cyndi Lennert, MD  furosemide (LASIX) 20 MG tablet Take 1 tablet (20 mg total) by mouth daily for 5 days. 07/20/22 07/25/22  Anyanwu, Jethro Bastos, MD  ibuprofen (ADVIL) 600 MG tablet Take 1 tablet (600 mg total) by mouth every 6 (six) hours as needed for headache, mild pain, moderate pain or cramping. 07/20/22   Anyanwu, Jethro Bastos, MD  metoCLOPramide (REGLAN) 10 MG tablet Take 1 tablet (10 mg total) by mouth every 6 (six) hours as needed for nausea or vomiting (or headache). 02/19/22   Donette Larry, CNM  prenatal vitamin w/FE, FA (PRENATAL 1 + 1) 27-1 MG TABS tablet  Take 1 tablet by mouth daily at 12 noon. 12/23/21   Warden Fillers, MD      Allergies    Shellfish allergy    Review of Systems   Review of Systems  All other systems reviewed and are negative.   Physical Exam Updated Vital Signs BP (!) 137/98 (BP Location: Right Arm)   Pulse (!) 115   Temp 98.2 F (36.8 C) (Oral)   Resp 14   Wt (!) 144.4 kg   LMP 11/11/2021 Comment: delivered 07/18/22  SpO2 98%   BMI 54.64 kg/m  Physical Exam Vitals and nursing note reviewed.  Constitutional:      General: She is not in acute distress.    Appearance: She is well-developed. She is not diaphoretic.  HENT:     Head: Normocephalic and atraumatic.  Cardiovascular:     Rate and Rhythm: Normal rate and regular rhythm.     Heart sounds: No murmur heard.    No friction rub. No gallop.  Pulmonary:     Effort: Pulmonary effort is normal. No respiratory distress.     Breath sounds: Normal breath sounds. No wheezing.  Abdominal:     General: Bowel sounds are normal. There is no distension.     Palpations: Abdomen is soft.     Tenderness: There is abdominal tenderness in the right upper quadrant and epigastric area. There is  no right CVA tenderness, left CVA tenderness, guarding or rebound.  Musculoskeletal:        General: Normal range of motion.     Cervical back: Normal range of motion and neck supple.  Skin:    General: Skin is warm and dry.  Neurological:     General: No focal deficit present.     Mental Status: She is alert and oriented to person, place, and time.     ED Results / Procedures / Treatments   Labs (all labs ordered are listed, but only abnormal results are displayed) Labs Reviewed - No data to display  EKG None  Radiology No results found.  Procedures Procedures    Medications Ordered in ED Medications  sodium chloride 0.9 % bolus 1,000 mL (has no administration in time range)  ondansetron (ZOFRAN) injection 4 mg (has no administration in time range)     ED Course/ Medical Decision Making/ A&P  Patient is a 26 year old female 3 weeks status post vaginal delivery of twins.  She presents today with complaints of epigastric pain, nausea, and vomiting that started acutely this evening.  Patient ate spicy potato chips and hot chocolate prior to going to bed.  Patient arrives here afebrile with stable vital signs.  Physical examination reveals tenderness to the right upper quadrant and epigastric region.  Remainder of physical examination is benign.  Workup initiated including CBC, CMP, and lipase, all of which were unremarkable.  Serum hCG was positive, however this is elevated secondary to recent childbirth.  A CT scan was obtained showing biliary sludge and tiny stones within the gallbladder, however no cholecystitis.  There is also the question of possible retained products of conception for which an ultrasound was recommended.  This will be obtained.  Patient was given normal saline along with Zofran and is now feeling significantly improved.  Patient's symptoms may well be related to an acute gastroenteritis or possibly biliary colic, but nothing appears emergently surgical.  Care will be signed out to oncoming provider at shift change awaiting results of ultrasound.  Final Clinical Impression(s) / ED Diagnoses Final diagnoses:  None    Rx / DC Orders ED Discharge Orders     None         Geoffery Lyons, MD 07/31/22 [phone]

## 2022-08-04 ENCOUNTER — Encounter: Payer: Medicaid Other | Admitting: Family Medicine

## 2022-08-07 ENCOUNTER — Encounter: Payer: Self-pay | Admitting: Obstetrics and Gynecology

## 2022-08-08 ENCOUNTER — Telehealth: Payer: Self-pay | Admitting: Family Medicine

## 2022-08-08 NOTE — Telephone Encounter (Signed)
Addressed via MyChart message

## 2022-08-08 NOTE — Telephone Encounter (Signed)
Patient called in wanting to return to work today or tomorrow. 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

## 2022-08-12 ENCOUNTER — Encounter: Payer: Medicaid Other | Admitting: Obstetrics and Gynecology

## 2022-08-15 ENCOUNTER — Ambulatory Visit: Payer: Medicaid Other | Admitting: Obstetrics & Gynecology

## 2022-08-18 ENCOUNTER — Encounter: Payer: Self-pay | Admitting: Obstetrics and Gynecology

## 2022-08-18 ENCOUNTER — Ambulatory Visit: Payer: Medicaid Other | Admitting: Obstetrics and Gynecology

## 2022-08-25 ENCOUNTER — Encounter: Payer: Self-pay | Admitting: Family Medicine

## 2022-08-27 ENCOUNTER — Ambulatory Visit: Payer: Self-pay

## 2022-08-27 NOTE — Lactation Note (Signed)
This note was copied from a baby's chart.  NICU Lactation Consultation Note  Patient Name: Josephina Melcher NFAOZ'H Date: 08/27/2022 Age:25 wk.o.  Subjective Reason for consult: Follow-up assessment; NICU baby; Multiple gestation; Term  Visited with family of 33 3/48 weeks old (adjusted) NICU twin female, she's a P2 and this is her second set of twins, her first set are 25 years old. She voiced she's still pumping but not longer bringing milk, she's keeping the milk for baby "B" Aniyah at home. Her goal is to eventually wean off formula and offer just breastmilk. Reviewed realistic expectations, pumping schedule and strategies to increase her supply; she's been taking baby "B" Aniyah to breast at home.   Objective Infant data: Mother's Current Feeding Choice: Breast Milk and Formula  Infant feeding assessment Scale for Readiness: 2 Scale for Quality: 2  Maternal data: [id number]  Vaginal, Spontaneous Pumping frequency: 5-6 times/24 hours Pumped volume: 15 mL Flange Size: 21 Pump: Personal, Hands Free, Manual (Zomee)  Assessment Infant: In NICU  Maternal: Milk volume: Low  Intervention/Plan Interventions: Breast feeding basics reviewed; DEBP; Education Tools: Pump; Flanges Pump Education: Setup, frequency, and cleaning; Milk Storage  Plan of care: Encouraged pumping every 3 hours, ideally 8 pumping sessions/24 hours She'll start power pumping in the AM She'll call for latch assistance when baby "A" is ready.    GOB (maternal) present and supportive. All questions and concerns answered, family to contact [facility] services PRN.   Consult Status: NICU follow-up  NICU Follow-up type: Weekly NICU follow up   Donal[place]on 08/27/2022, 6:06 PM

## 2022-08-30 ENCOUNTER — Encounter: Payer: Self-pay | Admitting: Advanced Practice Midwife

## 2022-08-30 ENCOUNTER — Ambulatory Visit (INDEPENDENT_AMBULATORY_CARE_PROVIDER_SITE_OTHER): Payer: Medicaid Other | Admitting: Advanced Practice Midwife

## 2022-08-30 DIAGNOSIS — Z3042 Encounter for surveillance of injectable contraceptive: Secondary | ICD-10-CM | POA: Diagnosis not present

## 2022-08-30 DIAGNOSIS — O924 Hypogalactia: Secondary | ICD-10-CM

## 2022-08-30 DIAGNOSIS — I159 Secondary hypertension, unspecified: Secondary | ICD-10-CM | POA: Diagnosis not present

## 2022-08-30 DIAGNOSIS — Z8751 Personal history of pre-term labor: Secondary | ICD-10-CM

## 2022-08-30 MED ORDER — MEDROXYPROGESTERONE ACETATE 150 MG/ML IM SUSP
150.0000 mg | Freq: Once | INTRAMUSCULAR | Status: AC
  Administered 2022-08-30: 150 mg via INTRAMUSCULAR

## 2022-08-30 NOTE — Progress Notes (Unsigned)
[facility] Partum Visit Note  Autumn Duarte is a 27 y.o. G91P1104 female who presents for a postpartum visit. She is 6 weeks postpartum following a preterm spontaneous vaginal delivery if twins.  I have fully reviewed the prenatal and intrapartum course. The delivery was at [redacted]w[redacted]d gestational weeks.  Anesthesia: none. Postpartum course has been Uncomplicated. Babies are doing well. Baby A is still in NICU and Baby B is home. Baby is feeding by breast and bottle - Similac Neosure. Considering stopping breastfeeding and starting again later. Did not give reason. Bleeding thin lochia. Bowel function is normal. Bladder function is normal. Patient is not sexually active. Contraception method is none. Postpartum depression screening: negative.    The pregnancy intention screening data noted above was reviewed. Potential methods of contraception were discussed. The patient elected to proceed with Hormonal Injection.      Edinburgh Postnatal Depression Scale - 08/30/22 1559       Edinburgh Postnatal Depression Scale:  In the Past 7 Days   I have been able to laugh and see the funny side of things. 0    I have looked forward with enjoyment to things. 0    I have blamed myself unnecessarily when things went wrong. 0    I have been anxious or worried for no good reason. 0    I have felt scared or panicky for no good reason. 1    Things have been getting on top of me. 1    I have been so unhappy that I have had difficulty sleeping. 0    I have felt sad or miserable. 0    I have been so unhappy that I have been crying. 0    The thought of harming myself has occurred to me. 0    Edinburgh Postnatal Depression Scale Total 2             Health Maintenance Due  Topic Date Due   COVID-19 Vaccine (2 - 2023-24 season) 04/01/2022    The following portions of the patient's history were reviewed and updated as appropriate: allergies, current medications, past family history, past medical history, past  social history, past surgical history, and problem list.  Hx CHTN. Not on Meds prior to pregnancy. Rx Lasix 5 days PP. Not on meds now.   Review of Systems Pertinent items are noted in HPI.  Denies HA, vision changes or epigastric pain.   Objective:  BP (!) 143/111   Pulse 75   Wt (!) 316 lb (143.3 kg)   LMP 11/11/2021 Comment: delivered 07/18/22  Breastfeeding No   BMI 54.24 kg/m   BP 135/108   General:  alert, cooperative, no distress, and morbidly obese   Breasts:  not indicated  Lungs: Nml rate and effort  Heart:  Nml rate  Abdomen: soft, non-tender; bowel sounds normal; no masses,  no organomegaly   Wound NA  GU exam:  not indicated              Extremities: No swelling  Assessment:  1. Postpartum care and examination - Doin well after preterm twin delivery. Baby A should be coming home next week.  2. Encounter for management and injection of injectable progestin contraceptive  - medroxyPROGESTERone (DEPO-PROVERA) injection 150 mg  3. Secondary hypertension - Rx Procardia 30 XL.  - Has appt with PCP in two weeks. Instructed pt that HTN needs to be followed closely.   4. Decreased lactation Pt asking about stopping restarting breastfeeding. CNM  supportive of whatever pt's choice is but did inform pt that it can be very difficult or impossible to resume lactation after stopping. Pt prefers to continue. CNM acknowledged challenges of BF especially with twins.  - Ambulatory referral to Lactation  5. History of preterm delivery - Dicussed importance of adequate pregnancy spacing to reduce risk of future PTD.     Plan:   Essential components of care per ACOG recommendations:  1.  Mood and well being: Patient with negative depression screening today. Reviewed local resources for support.  - Patient tobacco use? No.   - hx of drug use? No.    2. Infant care and feeding:  -Patient currently breastmilk feeding? No.  -Social determinants of health (SDOH) reviewed  in EPIC. No concerns. The following needs were identified: None.  3. Sexuality, contraception and birth spacing - Patient does not want a pregnancy in the next year.  Desired family size is 6 children.  - Reviewed reproductive life planning. Reviewed contraceptive methods based on pt preferences and effectiveness.  Patient desired Hormonal Implant today.   - Discussed birth spacing of 18 months  4. Sleep and fatigue -Encouraged family/partner/community support of 4 hrs of uninterrupted sleep to help with mood and fatigue  5. Physical Recovery  - Discussed patients delivery and complications. She describes her labor as good. - Patient had a Vaginal problems after delivery including postpartum Hemorrhage  . Patient had a 1st degree laceration, no repair. Perineal healing reviewed. Patient expressed understanding - Patient has urinary incontinence? No. - Patient is safe to resume physical and sexual activity  6.  Health Maintenance - HM due items addressed Yes - Last pap smear  Diagnosis  Date Value Ref Range Status  12/20/2021   Final   - Negative for intraepithelial lesion or malignancy (NILM)   Pap smear not done at today's visit.  -Breast Cancer screening indicated? No.   7. Chronic Disease/Pregnancy Condition follow up: Anemia and Hypertension  - PCP follow up in 2 weeks for HTN and Primary care  Manya Silvas, Mower for [employer], [place]

## 2022-08-30 NOTE — Patient Instructions (Signed)
Fenugreek 2 Tablets three x per day. Available at at [employer], [address].

## 2022-09-01 ENCOUNTER — Encounter: Payer: Self-pay | Admitting: Advanced Practice Midwife

## 2022-09-01 MED ORDER — NIFEDIPINE ER OSMOTIC RELEASE 30 MG PO TB24: 30.0000 mg | ORAL_TABLET | Freq: Every day | ORAL | 2 refills | Status: DC

## 2022-11-08 ENCOUNTER — Encounter: Payer: Self-pay | Admitting: Advanced Practice Midwife

## 2022-11-17 ENCOUNTER — Ambulatory Visit: Payer: Medicaid Other

## 2022-11-22 ENCOUNTER — Ambulatory Visit (INDEPENDENT_AMBULATORY_CARE_PROVIDER_SITE_OTHER): Payer: Medicaid Other

## 2022-11-22 ENCOUNTER — Other Ambulatory Visit: Payer: Self-pay

## 2022-11-22 VITALS — BP 130/93 | HR 87 | Wt 335.1 lb

## 2022-11-22 DIAGNOSIS — Z3042 Encounter for surveillance of injectable contraceptive: Secondary | ICD-10-CM

## 2022-11-22 MED ORDER — MEDROXYPROGESTERONE ACETATE 150 MG/ML IM SUSP
150.0000 mg | Freq: Once | INTRAMUSCULAR | Status: AC
  Administered 2022-11-22: 150 mg via INTRAMUSCULAR

## 2022-11-22 NOTE — Progress Notes (Signed)
Izela L Blow here for Depo-Provera Injection. Injection administered without complication. Patient will return in 3 months for next injection between 02/07/23 and 02/21/23. Next annual visit due January 2024.   BP mildly elevated today x 2. Pt reports stopping Procardia after postpartum visit. Did not follow up with PCP. Encouraged pt to schedule with PCP asap for eval of BP and possible restart of medication.  Marjo Bicker, RN 11/22/2022  8:40 AM

## 2022-12-23 ENCOUNTER — Encounter: Payer: Self-pay | Admitting: Advanced Practice Midwife

## 2023-01-18 ENCOUNTER — Other Ambulatory Visit (HOSPITAL_COMMUNITY)
Admission: RE | Admit: 2023-01-18 | Discharge: 2023-01-18 | Disposition: A | Discharge status: Home / Self Care | Payer: Medicaid Other | Source: Ambulatory Visit | Attending: Family Medicine | Admitting: Family Medicine

## 2023-01-18 ENCOUNTER — Ambulatory Visit (INDEPENDENT_AMBULATORY_CARE_PROVIDER_SITE_OTHER): Payer: Medicaid Other | Admitting: General Practice

## 2023-01-18 ENCOUNTER — Other Ambulatory Visit: Payer: Self-pay

## 2023-01-18 VITALS — BP 124/77 | HR 79 | Ht 64.0 in | Wt 340.0 lb

## 2023-01-18 DIAGNOSIS — R350 Frequency of micturition: Secondary | ICD-10-CM | POA: Diagnosis present

## 2023-01-18 LAB — POCT URINALYSIS DIP (DEVICE)
Bilirubin Urine: NEGATIVE
Glucose, UA: NEGATIVE mg/dL
Ketones, ur: NEGATIVE mg/dL
Nitrite: NEGATIVE
Protein, ur: NEGATIVE mg/dL
Specific Gravity, Urine: >=1.03 (ref 1.005–1.030)
Urobilinogen, UA: 0.2 mg/dL (ref 0.0–1.0)
pH: 6 (ref 5.0–8.0)

## 2023-01-18 NOTE — Progress Notes (Signed)
Patient presents to office today reporting urinary frequency since delivery of twins 6 months ago. She denies dysuria, flank pain, or incomplete emptying- does have some urinary leakage with coughing. Reports needing to urinate every 1-2 hours- her job was concerned about this and wanted her to be seen. UA reveals mod leuks- will send for culture. Discussed with Edd Arbour who recommends pelvic PT referral and vaginal swab to rule out infection. Discussed with patient. Self swab collected. Advised results will be back in 24-72 hours and will be available via mychart.   Chase Caller RN BSN 01/18/23

## 2023-01-19 LAB — CERVICOVAGINAL ANCILLARY ONLY
Bacterial Vaginitis (gardnerella): POSITIVE — AB
Candida Glabrata: NEGATIVE
Candida Vaginitis: NEGATIVE
Comment: NEGATIVE
Comment: NEGATIVE
Comment: NEGATIVE
Comment: NEGATIVE
Trichomonas: NEGATIVE

## 2023-01-19 LAB — URINE CULTURE

## 2023-01-20 ENCOUNTER — Other Ambulatory Visit: Payer: Self-pay | Admitting: Certified Nurse Midwife

## 2023-01-20 DIAGNOSIS — N760 Acute vaginitis: Secondary | ICD-10-CM

## 2023-01-20 MED ORDER — METRONIDAZOLE 500 MG PO TABS: 500.0000 mg | ORAL_TABLET | Freq: Two times a day (BID) | ORAL | 0 refills | Status: DC

## 2023-01-24 ENCOUNTER — Other Ambulatory Visit: Payer: Self-pay

## 2023-01-24 DIAGNOSIS — B9689 Other specified bacterial agents as the cause of diseases classified elsewhere: Secondary | ICD-10-CM

## 2023-02-07 ENCOUNTER — Ambulatory Visit: Payer: Medicaid Other

## 2023-02-08 ENCOUNTER — Other Ambulatory Visit: Payer: Self-pay

## 2023-02-08 ENCOUNTER — Emergency Department (HOSPITAL_BASED_OUTPATIENT_CLINIC_OR_DEPARTMENT_OTHER): Payer: Medicaid Other

## 2023-02-08 ENCOUNTER — Emergency Department (HOSPITAL_BASED_OUTPATIENT_CLINIC_OR_DEPARTMENT_OTHER)
Admission: EM | Admit: 2023-02-08 | Discharge: 2023-02-08 | Disposition: A | Discharge status: Home / Self Care | Payer: Medicaid Other | Attending: Emergency Medicine | Admitting: Emergency Medicine

## 2023-02-08 ENCOUNTER — Encounter (HOSPITAL_BASED_OUTPATIENT_CLINIC_OR_DEPARTMENT_OTHER): Payer: Self-pay

## 2023-02-08 DIAGNOSIS — S0083XA Contusion of other part of head, initial encounter: Secondary | ICD-10-CM | POA: Insufficient documentation

## 2023-02-08 DIAGNOSIS — S0990XA Unspecified injury of head, initial encounter: Secondary | ICD-10-CM | POA: Diagnosis present

## 2023-02-08 DIAGNOSIS — W01198A Fall on same level from slipping, tripping and stumbling with subsequent striking against other object, initial encounter: Secondary | ICD-10-CM | POA: Insufficient documentation

## 2023-02-08 DIAGNOSIS — W19XXXA Unspecified fall, initial encounter: Secondary | ICD-10-CM

## 2023-02-08 MED ORDER — IBUPROFEN 400 MG PO TABS
600.0000 mg | ORAL_TABLET | Freq: Once | ORAL | Status: AC
  Administered 2023-02-08: 600 mg via ORAL
  Filled 2023-02-08: qty 1

## 2023-02-08 NOTE — Discharge Instructions (Signed)
Please read and follow all provided instructions.  Your diagnoses today include:  1. Contusion of face, initial encounter   2. Minor head injury, initial encounter   3. Fall, initial encounter     Tests performed today include: CT scan of your head and facial bones that did not show any serious injury. Vital signs. See below for your results today.   Medications prescribed:  Please use over-the-counter NSAID medications (ibuprofen, naproxen) or Tylenol (acetaminophen) as directed on the packaging for pain -- as long as you do not have any reasons avoid these medications. Reasons to avoid NSAID medications include: weak kidneys, a history of bleeding in your stomach or gut, or uncontrolled high blood pressure or previous heart attack. Reasons to avoid Tylenol include: liver problems or ongoing alcohol use. Never take more than 4000mg  or 8 Extra strength Tylenol in a 24 hour period.     Take any prescribed medications only as directed.  Home care instructions:  Follow any educational materials contained in this packet.  BE VERY CAREFUL not to take multiple medicines containing Tylenol (also called acetaminophen). Doing so can lead to an overdose which can damage your liver and cause liver failure and possibly death.   Follow-up instructions: Please follow-up with your primary care provider as needed for further evaluation of your symptoms.   Return instructions:  SEEK IMMEDIATE MEDICAL ATTENTION IF: There is confusion or drowsiness (although children frequently become drowsy after injury).  You cannot awaken the injured person.  You have more than one episode of vomiting.  You notice dizziness or unsteadiness which is getting worse, or inability to walk.  You have convulsions or unconsciousness.  You experience severe, persistent headaches not relieved by Tylenol. You cannot use arms or legs normally.  There are changes in pupil sizes. (This is the black center in the colored part of  the eye)  There is clear or bloody discharge from the nose or ears.  You have change in speech, vision, swallowing, or understanding.  Localized weakness, numbness, tingling, or change in bowel or bladder control. You have any other emergent concerns.  Additional Information: You have had a head injury which does not appear to require admission at this time.  Your vital signs today were: BP (!) 141/100 (BP Location: Left Wrist)   Pulse (!) 106   Temp 97.7 F (36.5 C) (Oral)   Resp 16   Ht 5\' 4"  (1.626 m)   Wt (!) 154.2 kg   SpO2 98%   BMI 58.36 kg/m  If your blood pressure (BP) was elevated above 135/85 this visit, please have this repeated by your doctor within one month. --------------

## 2023-02-08 NOTE — ED Provider Notes (Signed)
[place] [facility] Provider Note   CSN: [id number] Arrival date & time: 02/08/23  1805     History  Chief Complaint  Patient presents with   Head Injury    Autumn Duarte is a 27 y.o. female.  Patient presents to the emergency department for evaluation of head injury.  Patient was mopping a wet floor when she slipped and fell forward striking her left cheek area on a countertop and then the floor.  She did not lose consciousness.  She denies other injuries.  She currently complains of frontal headache and posterior headache as well as swelling below her left orbit.  No dental injury and she is moving her jaw normally.  No treatments prior to arrival.  No vision change.  No confusion, vomiting, weakness, numbness, or tingling in the arms of the legs.      Home Medications Prior to Admission medications   Medication Sig Start Date End Date Taking? Authorizing Provider  albuterol (VENTOLIN HFA) 108 (90 Base) MCG/ACT inhaler Inhale into the lungs. 07/08/22   [provider]  aspirin-acetaminophen-caffeine (EXCEDRIN MIGRAINE) [phone] MG tablet Take by mouth every 6 (six) hours as needed for headache.    [provider]  metroNIDAZOLE (FLAGYL) 500 MG tablet Take 1 tablet (500 mg total) by mouth 2 (two) times daily. 01/20/23   Bernerd Limbo, CNM  Multiple Vitamin (MULTIVITAMIN) capsule Take 1 capsule by mouth daily.    [provider]      Allergies    Shellfish allergy    Review of Systems   Review of Systems  Physical Exam Updated Vital Signs BP (!) 141/100 (BP Location: Left Wrist)   Pulse (!) 106   Temp 97.7 F (36.5 C) (Oral)   Resp 16   Ht 5\' 4"  (1.626 m)   Wt (!) 154.2 kg   SpO2 98%   BMI 58.36 kg/m  Physical Exam Vitals and nursing note reviewed.  Constitutional:      Appearance: She is well-developed.  HENT:     Head: Normocephalic. No raccoon eyes or Battle's sign.     Comments: Swelling and  mild ecchymosis just inferior to the left orbit with generalized tenderness    Right Ear: Tympanic membrane, ear canal and external ear normal. No hemotympanum.     Left Ear: Tympanic membrane, ear canal and external ear normal. No hemotympanum.     Nose: Nose normal.     Mouth/Throat:     Pharynx: Uvula midline.  Eyes:     General: Lids are normal.     Extraocular Movements: Extraocular movements intact.     Right eye: No nystagmus.     Left eye: No nystagmus.     Conjunctiva/sclera: Conjunctivae normal.     Pupils: Pupils are equal, round, and reactive to light.     Comments: No visible hyphema noted  Cardiovascular:     Rate and Rhythm: Normal rate and regular rhythm.  Pulmonary:     Effort: Pulmonary effort is normal.     Breath sounds: Normal breath sounds.  Abdominal:     Palpations: Abdomen is soft.     Tenderness: There is no abdominal tenderness.  Musculoskeletal:     Cervical back: Normal range of motion and neck supple. No tenderness or bony tenderness.     Thoracic back: No tenderness or bony tenderness.     Lumbar back: No tenderness or bony tenderness.  Skin:    General: Skin is  warm and dry.  Neurological:     Mental Status: She is alert and oriented to person, place, and time.     GCS: GCS eye subscore is 4. GCS verbal subscore is 5. GCS motor subscore is 6.     Cranial Nerves: No cranial nerve deficit.     Sensory: No sensory deficit.     Coordination: Coordination normal.     Deep Tendon Reflexes: Reflexes are normal and symmetric.    ED Results / Procedures / Treatments   Labs (all labs ordered are listed, but only abnormal results are displayed) Labs Reviewed - No data to display  EKG None  Radiology CT Maxillofacial Wo Contrast  Result Date: 02/08/2023 CLINICAL DATA:  Facial trauma. EXAM: CT MAXILLOFACIAL WITHOUT CONTRAST TECHNIQUE: Multidetector CT imaging of the maxillofacial structures was performed. Multiplanar CT image reconstructions were  also generated. RADIATION DOSE REDUCTION: This exam was performed according to the departmental dose-optimization program which includes automated exposure control, adjustment of the mA and/or kV according to patient size and/or use of iterative reconstruction technique. COMPARISON:  Head CT dated 02/08/2023. FINDINGS: Osseous: No acute fracture.  No mandibular dislocation. Orbits: The globes and retro-orbital fat are preserved. Sinuses: There is a 1.7 cm left maxillary sinus retention cyst or polyp the remainder of the visualized paranasal sinuses and mastoid air cells are clear. Soft tissues: Left lateral periorbital soft tissue contusion. No large hematoma. Limited intracranial: No significant or unexpected finding. IMPRESSION: 1. No acute facial bone fractures. 2. Left lateral periorbital soft tissue contusion. Electronically Signed   By: Elgie Collard M.D.   On: 02/08/2023 20:14   CT Head Wo Contrast  Result Date: 02/08/2023 CLINICAL DATA:  Trauma, fall EXAM: CT HEAD WITHOUT CONTRAST TECHNIQUE: Contiguous axial images were obtained from the base of the skull through the vertex without intravenous contrast. RADIATION DOSE REDUCTION: This exam was performed according to the departmental dose-optimization program which includes automated exposure control, adjustment of the mA and/or kV according to patient size and/or use of iterative reconstruction technique. COMPARISON:  None Available. FINDINGS: Brain: No acute intracranial findings are seen. There are no signs of bleeding within the cranium. Ventricles are not dilated. There is no focal edema or mass effect. Vascular: Unremarkable. Skull: No acute findings are seen. Sinuses/Orbits: There are no air-fluid levels in the sinuses. Mucous retention cyst is seen in left maxillary sinus. Other: There is contusion/hematoma in left periorbital soft tissues. IMPRESSION: No acute intracranial findings are seen. Electronically Signed   By: Ernie Avena M.D.    On: 02/08/2023 20:04    Procedures Procedures    Medications Ordered in ED Medications  ibuprofen (ADVIL) tablet 600 mg (has no administration in time range)    ED Course/ Medical Decision Making/ A&P    Patient seen and examined. History obtained directly from patient.   Labs/EKG: None ordered  Imaging: Ordered CT head and maxillofacial.  Medications/Fluids: Ordered: Ibuprofen p.o.   Most recent vital signs reviewed and are as follows: BP (!) 141/100 (BP Location: Left Wrist)   Pulse (!) 106   Temp 97.7 F (36.5 C) (Oral)   Resp 16   Ht 5\' 4"  (1.626 m)   Wt (!) 154.2 kg   SpO2 98%   BMI 58.36 kg/m   Initial impression: Minor head injury, headache, facial contusion.  8:46 PM Reassessment performed. Patient appears comfortable.  Imaging personally visualized and interpreted including: CT head, CT maxillofacial, agree no signs of fracture or intracranial injury, contusion noted  Reviewed pertinent lab work and imaging with patient at bedside. Questions answered.   Most current vital signs reviewed and are as follows: BP (!) 141/100 (BP Location: Left Wrist)   Pulse (!) 106   Temp 97.7 F (36.5 C) (Oral)   Resp 16   Ht 5\' 4"  (1.626 m)   Wt (!) 154.2 kg   SpO2 98%   BMI 58.36 kg/m   Plan: Discharge to home.   Prescriptions written for: None  Other home care instructions discussed: Rest, OTC meds, gentle stretching  ED return instructions discussed: Patient was counseled on head injury precautions and symptoms that should indicate their return to the ED.  These include severe worsening headache, vision changes, confusion, loss of consciousness, trouble walking, nausea & vomiting, or weakness/tingling in extremities.    Follow-up instructions discussed: Patient encouraged to follow-up with their PCP as needed                              Medical Decision Making Amount and/or Complexity of Data Reviewed Radiology: ordered.   Patient with head injury  and facial contusion in setting of a mechanical fall.  No C-spine injury suspected.  CT head and maxillofacial are negative and do not demonstrate any sign of facial fracture or intracranial injury.  Low concern for concussion at this time.  Patient appears well, nontoxic.  Suspect that she may have some musculoskeletal pain tomorrow as muscle start to tighten.  Symptomatic treatment indicated at this time.        Final Clinical Impression(s) / ED Diagnoses Final diagnoses:  Contusion of face, initial encounter  Minor head injury, initial encounter  Fall, initial encounter    Rx / DC Orders ED Discharge Orders     None         Renne Crigler, Cordelia Poche 02/08/23 2048    Charlynne Pander, MD 02/08/23 2231

## 2023-02-08 NOTE — ED Triage Notes (Signed)
Pt arrives with c/o head injury after slipping on a wet floor. Pt hit her head on the counter. Pt has swelling left side of face underneath left eye. Pt denies LOC or dizziness. Pt ambulatory to triage.

## 2023-02-21 ENCOUNTER — Ambulatory Visit: Payer: Medicaid Other | Admitting: Family Medicine

## 2023-05-01 ENCOUNTER — Encounter (HOSPITAL_BASED_OUTPATIENT_CLINIC_OR_DEPARTMENT_OTHER): Payer: Self-pay | Admitting: Emergency Medicine

## 2023-05-01 ENCOUNTER — Other Ambulatory Visit: Payer: Self-pay

## 2023-05-01 ENCOUNTER — Other Ambulatory Visit (HOSPITAL_COMMUNITY): Payer: Self-pay | Admitting: Obstetrics & Gynecology

## 2023-05-01 ENCOUNTER — Emergency Department: Payer: Medicaid Other

## 2023-05-01 ENCOUNTER — Inpatient Hospital Stay (HOSPITAL_BASED_OUTPATIENT_CLINIC_OR_DEPARTMENT_OTHER)
Admission: AD | Admit: 2023-05-01 | Discharge: 2023-05-01 | Disposition: A | Discharge status: Home / Self Care | Payer: Medicaid Other | Attending: Obstetrics & Gynecology | Admitting: Obstetrics & Gynecology

## 2023-05-01 ENCOUNTER — Emergency Department (HOSPITAL_BASED_OUTPATIENT_CLINIC_OR_DEPARTMENT_OTHER): Payer: Medicaid Other

## 2023-05-01 DIAGNOSIS — O26899 Other specified pregnancy related conditions, unspecified trimester: Secondary | ICD-10-CM | POA: Insufficient documentation

## 2023-05-01 DIAGNOSIS — N9489 Other specified conditions associated with female genital organs and menstrual cycle: Secondary | ICD-10-CM | POA: Diagnosis present

## 2023-05-01 DIAGNOSIS — Z3A01 Less than 8 weeks gestation of pregnancy: Secondary | ICD-10-CM

## 2023-05-01 DIAGNOSIS — R102 Pelvic and perineal pain: Secondary | ICD-10-CM

## 2023-05-01 DIAGNOSIS — Z3A00 Weeks of gestation of pregnancy not specified: Secondary | ICD-10-CM | POA: Diagnosis not present

## 2023-05-01 DIAGNOSIS — O209 Hemorrhage in early pregnancy, unspecified: Secondary | ICD-10-CM | POA: Diagnosis not present

## 2023-05-01 DIAGNOSIS — O00102 Left tubal pregnancy without intrauterine pregnancy: Secondary | ICD-10-CM | POA: Diagnosis not present

## 2023-05-01 DIAGNOSIS — O009 Unspecified ectopic pregnancy without intrauterine pregnancy: Secondary | ICD-10-CM | POA: Diagnosis not present

## 2023-05-01 DIAGNOSIS — O3680X0 Pregnancy with inconclusive fetal viability, not applicable or unspecified: Secondary | ICD-10-CM | POA: Diagnosis not present

## 2023-05-01 DIAGNOSIS — R1032 Left lower quadrant pain: Secondary | ICD-10-CM

## 2023-05-01 DIAGNOSIS — K661 Hemoperitoneum: Secondary | ICD-10-CM

## 2023-05-01 LAB — URINALYSIS, ROUTINE W REFLEX MICROSCOPIC
Bilirubin Urine: NEGATIVE
Glucose, UA: NEGATIVE mg/dL
Ketones, ur: NEGATIVE mg/dL
Nitrite: NEGATIVE
Protein, ur: NEGATIVE mg/dL
Specific Gravity, Urine: >=1.03 (ref 1.005–1.030)
pH: 5.5 (ref 5.0–8.0)

## 2023-05-01 LAB — COMPREHENSIVE METABOLIC PANEL
ALT: 28 U/L (ref 0–44)
AST: 23 U/L (ref 15–41)
Albumin: 3.5 g/dL (ref 3.5–5.0)
Alkaline Phosphatase: 31 U/L — ABNORMAL LOW (ref 38–126)
Anion gap: 8 (ref 5–15)
BUN: 11 mg/dL (ref 6–20)
CO2: 27 mmol/L (ref 22–32)
Calcium: 8.8 mg/dL — ABNORMAL LOW (ref 8.9–10.3)
Chloride: 103 mmol/L (ref 98–111)
Creatinine, Ser: 0.74 mg/dL (ref 0.44–1.00)
GFR, Estimated: >60 mL/min (ref >60)
Glucose, Bld: 109 mg/dL — ABNORMAL HIGH (ref 70–99)
Potassium: 3.8 mmol/L (ref 3.5–5.1)
Sodium: 138 mmol/L (ref 135–145)
Total Bilirubin: 0.1 mg/dL — ABNORMAL LOW (ref 0.3–1.2)
Total Protein: 7.2 g/dL (ref 6.5–8.1)

## 2023-05-01 LAB — LIPASE, BLOOD: Lipase: 22 U/L (ref 11–51)

## 2023-05-01 LAB — CBC
HCT: 37.7 % (ref 36.0–46.0)
Hemoglobin: 12.5 g/dL (ref 12.0–15.0)
MCH: 29.6 pg (ref 26.0–34.0)
MCHC: 33.2 g/dL (ref 30.0–36.0)
MCV: 89.3 fL (ref 80.0–100.0)
Platelets: 280 10*3/uL (ref 150–400)
RBC: 4.22 MIL/uL (ref 3.87–5.11)
RDW: 13.2 % (ref 11.5–15.5)
WBC: 8.5 10*3/uL (ref 4.0–10.5)
nRBC: 0 % (ref 0.0–0.2)

## 2023-05-01 LAB — URINALYSIS, MICROSCOPIC (REFLEX)

## 2023-05-01 LAB — HCG, QUANTITATIVE, PREGNANCY: hCG, Beta Chain, Quant, S: 77 m[IU]/mL — ABNORMAL HIGH (ref <5)

## 2023-05-01 LAB — PREGNANCY, URINE: Preg Test, Ur: POSITIVE — AB

## 2023-05-01 MED ORDER — SODIUM CHLORIDE 0.9 % IV SOLN
2.0000 g | Freq: Once | INTRAVENOUS | Status: AC
  Administered 2023-05-01: 2 g via INTRAVENOUS
  Filled 2023-05-01: qty 20

## 2023-05-01 MED ORDER — FENTANYL CITRATE PF 50 MCG/ML IJ SOSY
50.0000 ug | PREFILLED_SYRINGE | INTRAMUSCULAR | Status: DC | PRN
  Administered 2023-05-01: 50 ug via INTRAVENOUS
  Filled 2023-05-01: qty 1

## 2023-05-01 MED ORDER — LACTATED RINGERS IV BOLUS
1000.0000 mL | Freq: Once | INTRAVENOUS | Status: AC
  Administered 2023-05-01: 1000 mL via INTRAVENOUS

## 2023-05-01 MED ORDER — MORPHINE SULFATE (PF) 4 MG/ML IV SOLN
4.0000 mg | Freq: Once | INTRAVENOUS | Status: AC
  Administered 2023-05-01: 4 mg via INTRAVENOUS
  Filled 2023-05-01: qty 1

## 2023-05-01 NOTE — ED Provider Notes (Signed)
[place] EM[facility]ENC[facility] [place]   CSN: [id number] Arrival date & time: 05/01/23  5643     History  Chief Complaint  Patient presents with   Abdominal Pain    Autumn Duarte is a 27 y.o. female.   Abdominal Pain  Patient is a 27 year old female with past medical history significant for obesity, prediabetes asthma  Patient presents emergency room today with complaints of 3 days of intermittent pain seems that it is lasted an hour or 2 at a time is been sharp crampy at times severe and associated with nausea and vomiting.  She states that she is sexually active has had several condoms right recently.  She states that she is having some vaginal spotting she states her last menstrual cycle was in August.  No history of ectopic pregnancies but has had an IUD in the past.  No fevers or chills.  No urinary frequency urgency dysuria hematuria. Has had normal bowel movements.     Home Medications Prior to Admission medications   Medication Sig Start Date End Date Taking? Authorizing Provider  albuterol (VENTOLIN HFA) 108 (90 Base) MCG/ACT inhaler Inhale into the lungs. 07/08/22   [provider]  aspirin-acetaminophen-caffeine (EXCEDRIN MIGRAINE) [phone] MG tablet Take by mouth every 6 (six) hours as needed for headache.    [provider]  metroNIDAZOLE (FLAGYL) 500 MG tablet Take 1 tablet (500 mg total) by mouth 2 (two) times daily. 01/20/23   Bernerd Limbo, CNM  Multiple Vitamin (MULTIVITAMIN) capsule Take 1 capsule by mouth daily.    [provider]      Allergies    Shellfish allergy    Review of Systems   Review of Systems  Gastrointestinal:  Positive for abdominal pain.    Physical Exam Updated Vital Signs BP (!) 131/112   Pulse 80   Temp 98.4 F (36.9 C) (Oral)   Resp 18   Ht 5\' 4"  (1.626 m)   Wt (!) 154.2 kg   SpO2 100%   BMI 58.35 kg/m  Physical Exam Vitals and nursing note reviewed.   Constitutional:      General: She is not in acute distress.    Appearance: She is obese.  HENT:     Head: Normocephalic and atraumatic.     Nose: Nose normal.  Eyes:     General: No scleral icterus. Cardiovascular:     Rate and Rhythm: Normal rate and regular rhythm.     Pulses: Normal pulses.     Heart sounds: Normal heart sounds.  Pulmonary:     Effort: Pulmonary effort is normal. No respiratory distress.     Breath sounds: No wheezing.  Abdominal:     Palpations: Abdomen is soft.     Tenderness: There is abdominal tenderness in the left lower quadrant.  Musculoskeletal:     Cervical back: Normal range of motion.     Right lower leg: No edema.     Left lower leg: No edema.  Skin:    General: Skin is warm and dry.     Capillary Refill: Capillary refill takes less than 2 seconds.  Neurological:     Mental Status: She is alert. Mental status is at baseline.  Psychiatric:        Mood and Affect: Mood normal.        Behavior: Behavior normal.     ED Results / Procedures / Treatments   Labs (all labs ordered are listed, but only  abnormal results are displayed) Labs Reviewed  COMPREHENSIVE METABOLIC PANEL - Abnormal; Notable for the following components:      Result Value   Glucose, Bld 109 (*)    Calcium 8.8 (*)    Alkaline Phosphatase 31 (*)    Total Bilirubin 0.1 (*)    All other components within normal limits  URINALYSIS, ROUTINE W REFLEX MICROSCOPIC - Abnormal; Notable for the following components:   Hgb urine dipstick SMALL (*)    Leukocytes,Ua MODERATE (*)    All other components within normal limits  PREGNANCY, URINE - Abnormal; Notable for the following components:   Preg Test, Ur POSITIVE (*)    All other components within normal limits  URINALYSIS, MICROSCOPIC (REFLEX) - Abnormal; Notable for the following components:   Bacteria, UA MANY (*)    All other components within normal limits  HCG, QUANTITATIVE, PREGNANCY - Abnormal; Notable for the following  components:   hCG, Beta Chain, Quant, S 77 (*)    All other components within normal limits  URINE CULTURE  LIPASE, BLOOD  CBC    EKG None  Radiology US OB LESS THAN 14 WEEKS WITH OB TRANSVAGINAL  Result Date: 05/01/2023 CLINICAL DATA:  Bleeding and cramping.  Beta HCG = 77. EXAM: OBSTETRIC <14 WK [place] AND TRANSVAGINAL OB US TECHNIQUE: Both transabdominal and transvaginal ultrasound examinations were performed for complete evaluation of the gestation as well as the maternal uterus, adnexal regions, and pelvic cul-de-sac. Transvaginal technique was performed to assess early pregnancy. COMPARISON:  None Available. FINDINGS: Intrauterine gestational sac: None Yolk sac:  Not Visualized. Embryo:  Not Visualized. Maternal uterus/adnexae: Uterus measures 6.3 cm in sagittal dimension. Avascular, heterogeneous left adnexal mass measures 8.0 x 7.4 x 4.5 cm. Left ovary is not seen. Right ovary measures 2.1 x 2.0 x 1.7 cm. Moderate volume pelvic free fluid. IMPRESSION: 1. Pregnancy of unknown location. Heterogeneous left adnexal mass without vascularity on Doppler ultrasound examination. A left ovary is not confidently identified. Differential includes left adnexal ectopic pregnancy versus a torsed left ovary. 2. Moderate volume pelvic free fluid. Critical Value/emergent results were called by telephone at the time of interpretation on 05/01/2023 at 1:23 pm to provider [facility] , who verbally acknowledged these results. Electronically Signed   By: Agustin Cree M.D.   On: 05/01/2023 13:25    Procedures .Critical Care  Performed by: Gailen Shelter, PA Authorized by: Gailen Shelter, PA   Critical care provider statement:    Critical care time (minutes):  35   Critical care time was exclusive of:  Separately billable procedures and treating other patients and teaching time   Critical care was necessary to treat or prevent imminent or life-threatening deterioration of the following conditions: Ectopic  pregnancy with possible rupture and hemoperitoneum being transferred to [facility] for evaluation and possible surgery.   Critical care was time spent personally by me on the following activities:  Development of treatment plan with patient or surrogate, review of old charts, re-evaluation of patient's condition, pulse oximetry, ordering and review of radiographic studies, ordering and review of laboratory studies, ordering and performing treatments and interventions, obtaining history from patient or surrogate, examination of patient and evaluation of patient's response to treatment   Care discussed with: admitting provider       Medications Ordered in ED Medications  fentaNYL (SUBLIMAZE) injection 50 mcg (50 mcg Intravenous Given 05/01/23 1423)  cefTRIAXone (ROCEPHIN) 2 g in sodium chloride 0.9 % 100 mL IVPB (0 g  Intravenous Stopped 05/01/23 1320)  lactated ringers bolus 1,000 mL ( Intravenous Stopped 05/01/23 1425)  morphine (PF) 4 MG/ML injection 4 mg (4 mg Intravenous Given 05/01/23 1151)    ED Course/ Medical Decision Making/ A&P Clinical Course as of 05/01/23 1445  Mon May 01, 2023  1328 Vaginal bleeding and LLQ pain over the past 3 days it seems that has been intermittent but severe when it occurs.  Has a history of an IUD no history of ectopic pregnancy. [WF]  13[facility] [address]  [WF]  1341 IMPRESSION: 1. Pregnancy of unknown location. Heterogeneous left adnexal mass without vascularity on Doppler ultrasound examination. A left ovary is not confidently identified. Differential includes left adnexal ectopic pregnancy versus a torsed left ovary. 2. Moderate volume pelvic free fluid.  Critical Value/emergent results were called by telephone at the time of interpretation on 05/01/2023 at 1:23 pm to provider [facility] , who verbally acknowledged these results.   [WF]  1341 [facility]  [WF]  1359 Discussed with OB - they will call back [WF]     Clinical Course User Index [WF] Gailen Shelter, PA                                 Medical Decision Making Amount and/or Complexity of Data Reviewed Labs: ordered. Radiology: ordered.  Risk Prescription drug management. Decision regarding hospitalization.   This patient presents to the ED for concern of pelvic/abd pain, this involves a number of treatment options, and is a complaint that carries with it a high risk of complications and morbidity. A differential diagnosis was considered for the patient's symptoms which is discussed below:   The causes of generalized abdominal pain include but are not limited to AAA, mesenteric ischemia, appendicitis, diverticulitis, DKA, gastritis, gastroenteritis, AMI, nephrolithiasis, pancreatitis, peritonitis, adrenal insufficiency,lead poisoning, iron toxicity, intestinal ischemia, constipation, UTI,SBO/LBO, splenic rupture, biliary disease, IBD, IBS, PUD, or hepatitis. Ectopic pregnancy, ovarian torsion, PID.    Co morbidities: Discussed in HPI   Brief History:  Patient is a 27 year old female with past medical history significant for obesity, prediabetes asthma  Patient presents emergency room today with complaints of 3 days of intermittent pain seems that it is lasted an hour or 2 at a time is been sharp crampy at times severe and associated with nausea and vomiting.  She states that she is sexually active has had several condoms right recently.  She states that she is having some vaginal spotting she states her last menstrual cycle was in August.  No history of ectopic pregnancies but has had an IUD in the past.  No fevers or chills.  No urinary frequency urgency dysuria hematuria. Has had normal bowel movements.    EMR reviewed including pt PMHx, past surgical history and past visits to ER.   See HPI for more details   Lab Tests:  CBC without leukocytosis or anemia, CMP unremarkable, urine pregnancy test positive, hCG elevated  at 77 lipase normal.  Urinalysis with many bacteria moderate leukocytes and hematuria is present.  Will give IV Rocephin for treatment of bacteriuria urine culture was obtained   Imaging Studies:  Abnormal findings. I personally reviewed all imaging studies. Imaging notable for Concern for ectopic pregnancy.  There is also a heterogenous left adnexal mass without vascularity.   Cardiac Monitoring:      Medicines ordered:  I ordered medication including lactated Ringer's, Rocephin, morphine for  pain, hydration, bacteria Reevaluation of the patient after these medicines showed that the patient improved I have reviewed the patients home medicines and have made adjustments as needed   Critical Interventions:   discussed with OB/GYN   Consults/Attending Physician   I requested consultation with OBGYN,  and discussed lab and imaging findings as well as pertinent plan - they [facility]   Reevaluation:  After the interventions noted above I re-evaluated patient and found that they have :improved   Social Determinants of Health:      Problem List / ED Course:  Left pelvic pain -pelvic ultrasound shows heterogenous mass as well as no intrauterine pregnancy.  Concern for ectopic. Dr. Macon Large is [facility] physician at [facility].  Patient pain is much improved after dose of morphine.  We dose of fentanyl prior to discharge.  Will be transported by ambulance service.   Dispostion:  After consideration of the diagnostic results and the patients response to treatment, I feel that the patent would benefit from admission at [facility].  Dr. Macon Large is accepting physician.  Final Clinical Impression(s) / ED Diagnoses Final diagnoses:  LLQ abdominal pain    Rx / DC Orders ED Discharge Orders     None         Gailen Shelter, [place] 05/01/23 1452    Arby Barrette, MD 05/04/23 [phone]

## 2023-05-01 NOTE — MAU Provider Note (Signed)
MAU Attending Provider Note  NANCE MCCOMBS is a 27 y.o. [phone] here for management of heterogenous avascular left adnexal mass, moderate pelvic free fluid and an HCG of 77.  The differential included ectopic tubal pregnancy causing hemoperitoneum vs adnexal cyst s/p rupture vs ovarian torsion.   Patient presented to [facility] ER today with report of lower abdominal pain for the last two months, and recent spotting.  No recent worsening of pain.  Reports the pain is noted/exacerbated especially after intercourse, which she had earlier today.   Also reported some off and on nausea and vomiting, none currently.  On evaluation there, she was noted to have stable vital signs, tender left lower abdomen with no peritoneal signs and a positive UPT.  Follow up lab evaluation showed WBC 8.5, Hgb 12.5, HCG of 77.  Ultrasound showed 8 cm heterogeneous left adnexal mass without vascularity on Doppler ultrasound examination and moderate volume pelvic free fluid.  She last ate food 04/30/23 night, no food or liquid intake today.  Our service was called and the recommendation was to transfer to [facility] MAU for further evaluation.  On arrival here, she *reports 2-3/10 pain but received a dose of Morphine IV at [facility].  No nausea or vomiting. "I feel fine". Denies any abnormal vaginal discharge, fevers, chills, sweats, dysuria, other GI or GU symptoms or other general symptoms.  She is accompanied by her fiance.  Past Medical History:  Diagnosis Date   Asthma    last used inhaler 2 months ago   Bronchitis    Eczema    History of postpartum hemorrhage 07/18/2022   History of preterm delivery 07/08/2022   Hypertension    Obesity    Seasonal allergies    Past Surgical History:  Procedure Laterality Date   WISDOM TOOTH EXTRACTION     OB History  Gravida Para Term Preterm AB Living  3 2 1 1   4   SAB IAB Ectopic Multiple Live Births        2 4    # Outcome Date GA Lbr Len/2nd Weight Sex Type Anes PTL Lv  3  Current           2A Preterm 07/18/22 [redacted]w[redacted]d / 00:09 2550 g M Vag-Spont None  LIV     Birth Comments: apneic and bradycardic requiring PPV- see note.  2B Preterm 07/18/22 [redacted]w[redacted]d / 00:45 2460 g F Vag-Spont None  LIV  1A Term 12/04/18 [redacted]w[redacted]d 11:32 / 01:20 2764 g F Vag-Spont EPI  LIV     Birth Comments: WDL  1B Term 12/04/18 [redacted]w[redacted]d 11:32 / 01:40 2554 g F Vag-Spont EPI  LIV     Birth Comments: WDL  Patient denies any other pertinent gynecologic issues.   No current facility-administered medications on file prior to encounter.   No current outpatient medications on file prior to encounter.   Allergies  Allergen Reactions   Shellfish Allergy Itching, Shortness Of Breath and Hives    Social History:   reports that she quit smoking about 5 years ago. Her smoking use included cigarettes. She has never used smokeless tobacco. She reports that she does not currently use alcohol. She reports that she does not use drugs.  Family History  Problem Relation Age of Onset   Arthritis Mother    Hepatitis Mother    Thyroid disease Mother    Diabetes Father    Heart disease Father    Hypertension Father    Breast cancer Maternal Aunt  Breast cancer Maternal Grandmother     Review of Systems: Pertinent items noted in HPI and remainder of comprehensive ROS otherwise negative.  PHYSICAL EXAM: Blood pressure (!) 124/54, pulse 76, temperature 98.4 F (36.9 C), temperature source Oral, resp. rate 18, height 5\' 4"  (1.626 m), weight (!) 154.2 kg, last menstrual period 04/09/2023, SpO2 100%, not currently breastfeeding. CONSTITUTIONAL: Well-developed, well-nourished female in no acute distress.  HENT:  Normocephalic, atraumatic, External right and left ear normal. Oropharynx is clear and moist EYES: Conjunctivae and EOM are normal. Pupils are equal, round, and reactive to light. No scleral icterus.  NECK: Normal range of motion, supple, no masses SKIN: Skin is warm and dry. No rash noted. Not diaphoretic.  No erythema. No pallor. NEUROLOGIC: Alert and oriented to person, place, and time. Normal reflexes, muscle tone coordination. No cranial nerve deficit noted. PSYCHIATRIC: Normal mood and affect. Normal behavior. Normal judgment and thought content. CARDIOVASCULAR: Normal heart rate noted, regular rhythm RESPIRATORY: Effort and breath sounds normal, no problems with respiration noted ABDOMEN: Soft,  morbidly obese, midly tender in LLQ, no rebound or guarding, nondistended. PELVIC: Deferred MUSCULOSKELETAL: Normal range of motion. No edema and no tenderness. 2+ distal pulses.  Labs: Results for orders placed or performed during the hospital encounter of 05/01/23 (from the past 336 hour(s))  Urinalysis, Routine w reflex microscopic -Urine, Clean Catch   Collection Time: 05/01/23  9:59 AM  Result Value Ref Range   Color, Urine YELLOW YELLOW   APPearance CLEAR CLEAR   Specific Gravity, Urine >=1.030 1.005 - 1.030   pH 5.5 5.0 - 8.0   Glucose, UA NEGATIVE NEGATIVE mg/dL   Hgb urine dipstick SMALL (A) NEGATIVE   Bilirubin Urine NEGATIVE NEGATIVE   Ketones, ur NEGATIVE NEGATIVE mg/dL   Protein, ur NEGATIVE NEGATIVE mg/dL   Nitrite NEGATIVE NEGATIVE   Leukocytes,Ua MODERATE (A) NEGATIVE  Pregnancy, urine   Collection Time: 05/01/23  9:59 AM  Result Value Ref Range   Preg Test, Ur POSITIVE (A) NEGATIVE  Urinalysis, Microscopic (reflex)   Collection Time: 05/01/23  9:59 AM  Result Value Ref Range   RBC / HPF 6-10 0 - 5 RBC/hpf   WBC, UA 11-20 0 - 5 WBC/hpf   Bacteria, UA MANY (A) NONE SEEN   Squamous Epithelial / HPF 11-20 0 - 5 /HPF  Lipase, blood   Collection Time: 05/01/23 10:33 AM  Result Value Ref Range   Lipase 22 11 - 51 U/L  Comprehensive metabolic panel   Collection Time: 05/01/23 10:33 AM  Result Value Ref Range   Sodium 138 135 - 145 mmol/L   Potassium 3.8 3.5 - 5.1 mmol/L   Chloride 103 98 - 111 mmol/L   CO2 27 22 - 32 mmol/L   Glucose, Bld 109 (H) 70 - 99 mg/dL   BUN  11 6 - 20 mg/dL   Creatinine, Ser 2.13 0.44 - 1.00 mg/dL   Calcium 8.8 (L) 8.9 - 10.3 mg/dL   Total Protein 7.2 6.5 - 8.1 g/dL   Albumin 3.5 3.5 - 5.0 g/dL   AST 23 15 - 41 U/L   ALT 28 0 - 44 U/L   Alkaline Phosphatase 31 (L) 38 - 126 U/L   Total Bilirubin 0.1 (L) 0.3 - 1.2 mg/dL   GFR, Estimated >08 >65 mL/min   Anion gap 8 5 - 15  CBC   Collection Time: 05/01/23 10:33 AM  Result Value Ref Range   WBC 8.5 4.0 - 10.5 K/uL   RBC 4.22  3.87 - 5.11 MIL/uL   Hemoglobin 12.5 12.0 - 15.0 g/dL   HCT 78.2 95.6 - 21.3 %   MCV 89.3 80.0 - 100.0 fL   MCH 29.6 26.0 - 34.0 pg   MCHC 33.2 30.0 - 36.0 g/dL   RDW 08.6 57.8 - 46.9 %   Platelets 280 150 - 400 K/uL   nRBC 0.0 0.0 - 0.2 %  hCG, quantitative, pregnancy   Collection Time: 05/01/23 10:33 AM  Result Value Ref Range   hCG, Beta Chain, Quant, S 77 (H) <5 mIU/mL    Imaging Studies: US OB LESS THAN 14 WEEKS WITH OB TRANSVAGINAL  Result Date: 05/01/2023 CLINICAL DATA:  Bleeding and cramping.  Beta HCG = 77. EXAM: OBSTETRIC <14 WK [place] AND TRANSVAGINAL OB US TECHNIQUE: Both transabdominal and transvaginal ultrasound examinations were performed for complete evaluation of the gestation as well as the maternal uterus, adnexal regions, and pelvic cul-de-sac. Transvaginal technique was performed to assess early pregnancy. COMPARISON:  None Available. FINDINGS: Intrauterine gestational sac: None Yolk sac:  Not Visualized. Embryo:  Not Visualized. Maternal uterus/adnexae: Uterus measures 6.3 cm in sagittal dimension. Avascular, heterogeneous left adnexal mass measures 8.0 x 7.4 x 4.5 cm. Left ovary is not seen. Right ovary measures 2.1 x 2.0 x 1.7 cm. Moderate volume pelvic free fluid. IMPRESSION: 1. Pregnancy of unknown location. Heterogeneous left adnexal mass without vascularity on Doppler ultrasound examination. A left ovary is not confidently identified. Differential includes left adnexal ectopic pregnancy versus a torsed left ovary. 2. Moderate  volume pelvic free fluid. Critical Value/emergent results were called by telephone at the time of interpretation on 05/01/2023 at 1:23 pm to provider [facility] , who verbally acknowledged these results. Electronically Signed   By: Agustin Cree M.D.   On: 05/01/2023 13:25    Assessment: Principal Problem:   Pregnancy of unknown anatomic location Active Problems:   Left adnexal mass  Plan: Discussed findings with patient and her fiance, emphasized the possibility of ruptured ectopic pregnancy with hemoperitoneum.  Other possibilities included ovarian mass, tubal mass, ovarian torsion, exophytic fibroid.  With an HCG of 77, the possibility of early intrauterine pregnancy is not ruled out.  But the findings are concerning enough for recommendation of surgical evaluation/surgical management.  Proposed doing a diagnostic laparoscopy, possible salpingectomy, possible oophorectomy.   The risks of surgery were discussed in detail with the patient including but not limited to: bleeding which may require transfusion or reoperation; infection which may require antibiotics; injury to surrounding organs which may involve bowel, bladder, ureters; need for additional procedures including laparotomy or subsequent procedures secondary to abnormal pathology; thromboembolic phenomenon; difficulty of surgery secondary to morbidly obese habitus; surgical site problems; death and other postoperative or anesthesia complications. Likelihood of success in alleviating the patient's condition was discussed.  Patient did not want to do surgery at this point, wanted to discuss other options.  Discussed medical management of possible ectopic pregnancy with methotrexate.  Patient reported she had been offered this in the past and declined this, and that pregnancy ended up being her last pregnancy with twin gestation.  She is not comfortable with possibly terminating an early intrauterine pregnancy.  Discussed the option of strict  expectant management, but emphasized that her condition can deteriorate and can cause further internal bleeding and death.  She was offered in hospital observation, but she declined as she has four children at home.   Patient wants the option of expectant management, and was told she needed to return  for any worsening pain or other concerning symptoms.  In the meantime, outpatient MR of the pelvis was ordered tomorrow for further delineation of this left adnexal mass, we will see if this helps in guiding further management.  An appointment was also made for her to follow up in MAU for repeat HCG lab draw and follow up on 05/03/23 morning.  It was strictly emphasized that she return to MAU  if she has worsening symptoms or changes her mind about wanting surgical or medical management.   Patient was discharged to home in current stable condition.  Oncoming Attending on call was notified about this patient situation.    Jaynie Collins, MD, FACOG Obstetrician & Gynecologist, [facility] for [employer], [facility]

## 2023-05-01 NOTE — MAU Note (Signed)
Transfer from [facility] with dx of pos ectopic pregnancy and/or torsion. Pt  denies pain at this time was given dilaudid at 1430.

## 2023-05-01 NOTE — ED Triage Notes (Signed)
Lower abd pain x 2 months states  denies  dysuria  LMP   was August but she is spotting now  has had nand vomiting  unsure if preg

## 2023-05-01 NOTE — ED Notes (Signed)
Report given to [facility]. 

## 2023-05-02 ENCOUNTER — Inpatient Hospital Stay (HOSPITAL_COMMUNITY)
Admission: AD | Admit: 2023-05-02 | Discharge: 2023-05-02 | Disposition: A | Discharge status: Home / Self Care | Payer: Medicaid Other | Attending: Obstetrics & Gynecology | Admitting: Obstetrics & Gynecology

## 2023-05-02 ENCOUNTER — Encounter (HOSPITAL_COMMUNITY)
Admission: AD | Disposition: A | Discharge status: Home / Self Care | Payer: Self-pay | Source: Home / Self Care | Attending: Obstetrics & Gynecology

## 2023-05-02 ENCOUNTER — Ambulatory Visit (HOSPITAL_COMMUNITY)
Admission: RE | Admit: 2023-05-02 | Discharge: 2023-05-02 | Disposition: A | Discharge status: Home / Self Care | Payer: Medicaid Other | Source: Ambulatory Visit | Attending: Obstetrics & Gynecology | Admitting: Obstetrics & Gynecology

## 2023-05-02 DIAGNOSIS — R102 Pelvic and perineal pain: Secondary | ICD-10-CM | POA: Diagnosis present

## 2023-05-02 DIAGNOSIS — O00102 Left tubal pregnancy without intrauterine pregnancy: Secondary | ICD-10-CM | POA: Diagnosis present

## 2023-05-02 DIAGNOSIS — Z3A01 Less than 8 weeks gestation of pregnancy: Secondary | ICD-10-CM | POA: Diagnosis not present

## 2023-05-02 DIAGNOSIS — O009 Unspecified ectopic pregnancy without intrauterine pregnancy: Secondary | ICD-10-CM

## 2023-05-02 HISTORY — DX: Unspecified ectopic pregnancy without intrauterine pregnancy: O00.90

## 2023-05-02 LAB — CBC
HCT: 34.3 % — ABNORMAL LOW (ref 36.0–46.0)
Hemoglobin: 11.7 g/dL — ABNORMAL LOW (ref 12.0–15.0)
MCH: 29.3 pg (ref 26.0–34.0)
MCHC: 34.1 g/dL (ref 30.0–36.0)
MCV: 85.8 fL (ref 80.0–100.0)
Platelets: 281 10*3/uL (ref 150–400)
RBC: 4 MIL/uL (ref 3.87–5.11)
RDW: 12.8 % (ref 11.5–15.5)
WBC: 9.3 10*3/uL (ref 4.0–10.5)
nRBC: 0 % (ref 0.0–0.2)

## 2023-05-02 LAB — COMPREHENSIVE METABOLIC PANEL
ALT: 25 U/L (ref 0–44)
AST: 20 U/L (ref 15–41)
Albumin: 3.4 g/dL — ABNORMAL LOW (ref 3.5–5.0)
Alkaline Phosphatase: 29 U/L — ABNORMAL LOW (ref 38–126)
Anion gap: 11 (ref 5–15)
BUN: 10 mg/dL (ref 6–20)
CO2: 23 mmol/L (ref 22–32)
Calcium: 8.7 mg/dL — ABNORMAL LOW (ref 8.9–10.3)
Chloride: 103 mmol/L (ref 98–111)
Creatinine, Ser: 0.74 mg/dL (ref 0.44–1.00)
GFR, Estimated: >60 mL/min (ref >60)
Glucose, Bld: 79 mg/dL (ref 70–99)
Potassium: 3.6 mmol/L (ref 3.5–5.1)
Sodium: 137 mmol/L (ref 135–145)
Total Bilirubin: 0.6 mg/dL (ref 0.3–1.2)
Total Protein: 7.1 g/dL (ref 6.5–8.1)

## 2023-05-02 LAB — URINE CULTURE

## 2023-05-02 LAB — HCG, QUANTITATIVE, PREGNANCY: hCG, Beta Chain, Quant, S: 69 m[IU]/mL — ABNORMAL HIGH (ref <5)

## 2023-05-02 SURGERY — LAPAROSCOPY, WITH ECTOPIC PREGNANCY SURGICAL TREATMENT
Anesthesia: General

## 2023-05-02 NOTE — Progress Notes (Signed)
MAU Attending Provider Note Addendum  MR PELVIS WO CONTRAST  Addendum Date: 05/02/2023   ADDENDUM REPORT: 05/02/2023 14:02 ADDENDUM: Critical Value/emergent results were called by telephone at the time of interpretation on 05/02/2023 at 1:40 pm to provider Jaynie Collins , who verbally acknowledged these results. Electronically Signed   By: Charline Bills M.D.   On: 05/02/2023 14:02   Result Date: 05/02/2023 CLINICAL DATA:  Pregnant, pelvic pain, left adnexal mass EXAM: MRI PELVIS WITHOUT CONTRAST TECHNIQUE: Multiplanar multisequence MR imaging of the pelvis was performed. No intravenous contrast was administered. COMPARISON:  OB ultrasound dated 05/01/2023 FINDINGS: Urinary Tract:  Bladder is within normal limits. Bowel:  Visualized bowel is grossly unremarkable. Vascular/Lymphatic: No evidence of aneurysm. No suspicious abdominal lymphadenopathy. Reproductive: Uterus is within normal limits. Endometrial complex measures 8 mm. No evidence of intrauterine gestation. Right ovary is within normal limits, measuring 2.5 x 1.8 cm (series 5/image 18). Left ovary is within normal limits, measuring 2.7 x 1.7 cm (series 5/image 13). Adjacent 6.7 x 5.5 cm left adnexal mass (series 3/image 16), favoring tubal ectopic pregnancy (series 2/image 18), with surrounding sentinel clot/hemorrhage (series 18/image 41). Other:  Moderate pelvic fluid with layering hemorrhage. Musculoskeletal: No focal osseous lesions. IMPRESSION: 6.7 cm left adnexal mass, favoring tubal ectopic pregnancy, with surrounding sentinel clot/hemorrhage. Moderate pelvic fluid with layering hemorrhage, raising concern for rupture. Electronically Signed: By: Charline Bills M.D. On: 05/02/2023 12:47   US OB LESS THAN 14 WEEKS WITH OB TRANSVAGINAL  Result Date: 05/01/2023 CLINICAL DATA:  Bleeding and cramping.  Beta HCG = 77. EXAM: OBSTETRIC <14 WK [place] AND TRANSVAGINAL OB US TECHNIQUE: Both transabdominal and transvaginal ultrasound examinations were  performed for complete evaluation of the gestation as well as the maternal uterus, adnexal regions, and pelvic cul-de-sac. Transvaginal technique was performed to assess early pregnancy. COMPARISON:  None Available. FINDINGS: Intrauterine gestational sac: None Yolk sac:  Not Visualized. Embryo:  Not Visualized. Maternal uterus/adnexae: Uterus measures 6.3 cm in sagittal dimension. Avascular, heterogeneous left adnexal mass measures 8.0 x 7.4 x 4.5 cm. Left ovary is not seen. Right ovary measures 2.1 x 2.0 x 1.7 cm. Moderate volume pelvic free fluid. IMPRESSION: 1. Pregnancy of unknown location. Heterogeneous left adnexal mass without vascularity on Doppler ultrasound examination. A left ovary is not confidently identified. Differential includes left adnexal ectopic pregnancy versus a torsed left ovary. 2. Moderate volume pelvic free fluid. Critical Value/emergent results were called by telephone at the time of interpretation on 05/01/2023 at 1:23 pm to provider [facility] , who verbally acknowledged these results. Electronically Signed   By: Agustin Cree M.D.   On: 05/01/2023 13:25    Called Rhiannon L Duda at home and after verifying two patient identifiers, discussed results of MRI with her.  She reports feeling fine, no pain since her visit yesterday and no other concerning symptoms.  I still recommended that she come in to [facility] MAU for further evaluation and discussion of management.  Patient agrees with this plan.  Of note, last food/drink intake was 1100 today, advised to remain NPO for now given likelihood of surgical management tonight.  At the least, labs will be checked to evaluate for possibility of ongoing bleeding and there will be another opportunity to discuss management options in detail.    Jaynie Collins, MD, FACOG Obstetrician & Gynecologist, [facility] for [employer], [facility]

## 2023-05-02 NOTE — MAU Provider Note (Signed)
History   Patient called in following MRI confirming ectopic pregnancy with hemoperitoneum. Patient found sleeping comfortably on the stretcher. She reports feeling well and is without any complaints. She reports minimal abdominal pain. She denies feeling dizzy or lightheaded  CSN: [id number]  Arrival date and time: 05/02/23 1602   None     Chief Complaint  Patient presents with   Vaginal Bleeding   Follow-up    Past Medical History:  Diagnosis Date   Asthma    last used inhaler 2 months ago   Bronchitis    Eczema    History of postpartum hemorrhage 07/18/2022   History of preterm delivery 07/08/2022   Hypertension    Obesity    Seasonal allergies     Past Surgical History:  Procedure Laterality Date   WISDOM TOOTH EXTRACTION      Family History  Problem Relation Age of Onset   Arthritis Mother    Hepatitis Mother    Thyroid disease Mother    Diabetes Father    Heart disease Father    Hypertension Father    Breast cancer Maternal Aunt    Breast cancer Maternal Grandmother     Social History   Tobacco Use   Smoking status: Former    Current packs/day: 0.00    Types: Cigarettes    Quit date: 03/19/2018    Years since quitting: 5.1   Smokeless tobacco: Never  Vaping Use   Vaping status: Every Day  Substance Use Topics   Alcohol use: Not Currently   Drug use: Never    Allergies:  Allergies  Allergen Reactions   Shellfish Allergy Itching, Shortness Of Breath and Hives    No medications prior to admission.    Review of Systems See pertinent in HPI. All other systems reviewed and non contributory Physical Exam   Blood pressure (!) 150/91, pulse (!) 103, temperature 98.3 F (36.8 C), temperature source Oral, resp. rate 18, height 5\' 4"  (1.626 m), weight (!) 163.2 kg, last menstrual period 04/09/2023, SpO2 100%, not currently breastfeeding.  Physical Exam GENERAL: Well-developed, well-nourished female in no acute distress.  LUNGS: Clear to  auscultation bilaterally.  HEART: Regular rate and rhythm. ABDOMEN: Soft, nontender, nondistended. No organomegaly. No rebound, no guarding PELVIC: Not performed EXTREMITIES: No cyanosis, clubbing, or edema, 2+ distal pulses.  MR PELVIS WO CONTRAST  Addendum Date: 05/02/2023   ADDENDUM REPORT: 05/02/2023 14:02 ADDENDUM: Critical Value/emergent results were called by telephone at the time of interpretation on 05/02/2023 at 1:40 pm to provider Jaynie Collins , who verbally acknowledged these results. Electronically Signed   By: Charline Bills M.D.   On: 05/02/2023 14:02   Result Date: 05/02/2023 CLINICAL DATA:  Pregnant, pelvic pain, left adnexal mass EXAM: MRI PELVIS WITHOUT CONTRAST TECHNIQUE: Multiplanar multisequence MR imaging of the pelvis was performed. No intravenous contrast was administered. COMPARISON:  OB ultrasound dated 05/01/2023 FINDINGS: Urinary Tract:  Bladder is within normal limits. Bowel:  Visualized bowel is grossly unremarkable. Vascular/Lymphatic: No evidence of aneurysm. No suspicious abdominal lymphadenopathy. Reproductive: Uterus is within normal limits. Endometrial complex measures 8 mm. No evidence of intrauterine gestation. Right ovary is within normal limits, measuring 2.5 x 1.8 cm (series 5/image 18). Left ovary is within normal limits, measuring 2.7 x 1.7 cm (series 5/image 13). Adjacent 6.7 x 5.5 cm left adnexal mass (series 3/image 16), favoring tubal ectopic pregnancy (series 2/image 18), with surrounding sentinel clot/hemorrhage (series 18/image 41). Other:  Moderate pelvic fluid with layering hemorrhage. Musculoskeletal: No focal osseous  lesions. IMPRESSION: 6.7 cm left adnexal mass, favoring tubal ectopic pregnancy, with surrounding sentinel clot/hemorrhage. Moderate pelvic fluid with layering hemorrhage, raising concern for rupture. Electronically Signed: By: Charline Bills M.D. On: 05/02/2023 12:47   US OB LESS THAN 14 WEEKS WITH OB TRANSVAGINAL  Result Date:  05/01/2023 CLINICAL DATA:  Bleeding and cramping.  Beta HCG = 77. EXAM: OBSTETRIC <14 WK [place] AND TRANSVAGINAL OB US TECHNIQUE: Both transabdominal and transvaginal ultrasound examinations were performed for complete evaluation of the gestation as well as the maternal uterus, adnexal regions, and pelvic cul-de-sac. Transvaginal technique was performed to assess early pregnancy. COMPARISON:  None Available. FINDINGS: Intrauterine gestational sac: None Yolk sac:  Not Visualized. Embryo:  Not Visualized. Maternal uterus/adnexae: Uterus measures 6.3 cm in sagittal dimension. Avascular, heterogeneous left adnexal mass measures 8.0 x 7.4 x 4.5 cm. Left ovary is not seen. Right ovary measures 2.1 x 2.0 x 1.7 cm. Moderate volume pelvic free fluid. IMPRESSION: 1. Pregnancy of unknown location. Heterogeneous left adnexal mass without vascularity on Doppler ultrasound examination. A left ovary is not confidently identified. Differential includes left adnexal ectopic pregnancy versus a torsed left ovary. 2. Moderate volume pelvic free fluid. Critical Value/emergent results were called by telephone at the time of interpretation on 05/01/2023 at 1:23 pm to provider [facility] , who verbally acknowledged these results. Electronically Signed   By: Agustin Cree M.D.   On: 05/01/2023 13:25    MAU Course  Procedures    Assessment and Plan  27 yo [id number] with a ruptured left tubal ectopic pregnancy - Patient is hemodynamically stable - Plan for patient to return tomorrow for laparoscopic salpingectomy with 2 partners due to BMI. - Offered patient to stay overnight and patient opted to return tomorrow.  - Patient was instructed to return at 1430 for pre-op. She was advised to remain NPO after midnight - Patient and her partner verbalized understanding and all questions were answered  Ardenia Stiner 05/02/2023, 6:05 PM

## 2023-05-03 ENCOUNTER — Encounter (HOSPITAL_COMMUNITY): Payer: Self-pay | Admitting: Certified Registered"

## 2023-05-03 ENCOUNTER — Ambulatory Visit (HOSPITAL_COMMUNITY)
Admission: RE | Admit: 2023-05-03 | Discharge: 2023-05-03 | Payer: Medicaid Other | Attending: Obstetrics and Gynecology | Admitting: Obstetrics and Gynecology

## 2023-05-03 ENCOUNTER — Inpatient Hospital Stay (HOSPITAL_COMMUNITY)
Admission: AD | Admit: 2023-05-03 | Discharge: 2023-05-03 | Disposition: A | Discharge status: Home / Self Care | Payer: Medicaid Other | Attending: Obstetrics & Gynecology | Admitting: Obstetrics & Gynecology

## 2023-05-03 ENCOUNTER — Encounter (HOSPITAL_COMMUNITY)
Admission: RE | Disposition: A | Discharge status: Other Acute Inpatient Hospital | Payer: Self-pay | Source: Home / Self Care | Attending: Obstetrics and Gynecology

## 2023-05-03 ENCOUNTER — Other Ambulatory Visit: Payer: Self-pay

## 2023-05-03 ENCOUNTER — Ambulatory Visit: Payer: Self-pay

## 2023-05-03 DIAGNOSIS — O009 Unspecified ectopic pregnancy without intrauterine pregnancy: Secondary | ICD-10-CM | POA: Insufficient documentation

## 2023-05-03 DIAGNOSIS — R1032 Left lower quadrant pain: Secondary | ICD-10-CM | POA: Diagnosis present

## 2023-05-03 DIAGNOSIS — Z3A01 Less than 8 weeks gestation of pregnancy: Secondary | ICD-10-CM | POA: Diagnosis not present

## 2023-05-03 DIAGNOSIS — K661 Hemoperitoneum: Secondary | ICD-10-CM | POA: Diagnosis present

## 2023-05-03 LAB — CBC
HCT: 35.4 % — ABNORMAL LOW (ref 36.0–46.0)
Hemoglobin: 12.1 g/dL (ref 12.0–15.0)
MCH: 29.5 pg (ref 26.0–34.0)
MCHC: 34.2 g/dL (ref 30.0–36.0)
MCV: 86.3 fL (ref 80.0–100.0)
Platelets: 288 10*3/uL (ref 150–400)
RBC: 4.1 MIL/uL (ref 3.87–5.11)
RDW: 12.9 % (ref 11.5–15.5)
WBC: 9.1 10*3/uL (ref 4.0–10.5)
nRBC: 0 % (ref 0.0–0.2)

## 2023-05-03 LAB — HCG, QUANTITATIVE, PREGNANCY: hCG, Beta Chain, Quant, S: 59 m[IU]/mL — ABNORMAL HIGH (ref <5)

## 2023-05-03 SURGERY — LAPAROSCOPY, WITH ECTOPIC PREGNANCY SURGICAL TREATMENT
Anesthesia: Choice

## 2023-05-03 MED ORDER — PHENYLEPHRINE 80 MCG/ML (10ML) SYRINGE FOR IV PUSH (FOR BLOOD PRESSURE SUPPORT)
PREFILLED_SYRINGE | INTRAVENOUS | Status: AC
  Filled 2023-05-03: qty 10

## 2023-05-03 MED ORDER — ROCURONIUM BROMIDE 10 MG/ML (PF) SYRINGE
PREFILLED_SYRINGE | INTRAVENOUS | Status: AC
  Filled 2023-05-03: qty 10

## 2023-05-03 MED ORDER — PROPOFOL 10 MG/ML IV BOLUS
INTRAVENOUS | Status: AC
  Filled 2023-05-03: qty 20

## 2023-05-03 MED ORDER — LIDOCAINE 2% (20 MG/ML) 5 ML SYRINGE
INTRAMUSCULAR | Status: AC
  Filled 2023-05-03: qty 5

## 2023-05-03 MED ORDER — LACTATED RINGERS IV SOLN: INTRAVENOUS | Status: DC

## 2023-05-03 MED ORDER — ORAL CARE MOUTH RINSE: 15.0000 mL | Freq: Once | OROMUCOSAL | Status: DC

## 2023-05-03 MED ORDER — CHLORHEXIDINE GLUCONATE 0.12 % MT SOLN: 15.0000 mL | Freq: Once | OROMUCOSAL | Status: DC

## 2023-05-03 MED ORDER — METHOTREXATE FOR ECTOPIC PREGNANCY
50.0000 mg/m2 | Freq: Once | INTRAMUSCULAR | Status: AC
  Administered 2023-05-03: 135 mg via INTRAMUSCULAR
  Filled 2023-05-03: qty 5.4

## 2023-05-03 MED ORDER — DEXAMETHASONE SODIUM PHOSPHATE 10 MG/ML IJ SOLN
INTRAMUSCULAR | Status: AC
  Filled 2023-05-03: qty 1

## 2023-05-03 MED ORDER — MIDAZOLAM HCL 2 MG/2ML IJ SOLN
INTRAMUSCULAR | Status: AC
  Filled 2023-05-03: qty 2

## 2023-05-03 MED ORDER — OXYCODONE HCL 5 MG PO TABS: 5.0000 mg | ORAL_TABLET | ORAL | 0 refills | Status: DC | PRN

## 2023-05-03 MED ORDER — ONDANSETRON HCL 4 MG/2ML IJ SOLN
INTRAMUSCULAR | Status: AC
  Filled 2023-05-03: qty 2

## 2023-05-03 MED ORDER — KETOROLAC TROMETHAMINE 30 MG/ML IJ SOLN
INTRAMUSCULAR | Status: AC
  Filled 2023-05-03: qty 1

## 2023-05-03 MED ORDER — FENTANYL CITRATE (PF) 250 MCG/5ML IJ SOLN
INTRAMUSCULAR | Status: AC
  Filled 2023-05-03: qty 5

## 2023-05-03 MED ORDER — SUCCINYLCHOLINE CHLORIDE 200 MG/10ML IV SOSY
PREFILLED_SYRINGE | INTRAVENOUS | Status: AC
  Filled 2023-05-03: qty 10

## 2023-05-03 MED ORDER — ACETAMINOPHEN 500 MG PO TABS
1000.0000 mg | ORAL_TABLET | Freq: Four times a day (QID) | ORAL | Status: DC | PRN
Start: 2023-05-03 — End: 2024-03-20

## 2023-05-03 MED ORDER — DOCUSATE SODIUM 100 MG PO CAPS: 100.0000 mg | ORAL_CAPSULE | Freq: Two times a day (BID) | ORAL | 2 refills | Status: DC | PRN

## 2023-05-03 MED ORDER — EPHEDRINE 5 MG/ML INJ
INTRAVENOUS | Status: AC
  Filled 2023-05-03: qty 5

## 2023-05-03 NOTE — Progress Notes (Signed)
    Faculty Practice OB/GYN Attending Note  Patient is 27 y.o. [phone] with known ectopic pregnancy definitively diagnosed on 05/02/23, here for scheduled surgical intervention. Of note, this has been an ongoing situation since 05/01/23, patient is without pain. She is hemodynamically stable, has no signs of ongoing bleeding. Hemoglobin stable around 12 over two days.  She declined surgical intervention on 05/01/23, and was going to be scheduled for surgery late yesterday, but patient wanted it moved until today.  Today, she came in with her significant other.  Denies any pain, dizziness, bleeding or other concerns.  I met with them in Short Stay area and discussed proposed surgery with them.  The risks of surgery were discussed in detail with the patient including but not limited to: bleeding which may require transfusion or reoperation; infection which may require antibiotics; injury to surrounding organs which may involve bowel, bladder, ureters; need for additional procedures including laparotomy or subsequent procedures secondary to abnormal pathology; thromboembolic phenomenon; difficulty of surgery secondary to morbidly obese habitus; surgical site problems; death and other postoperative or anesthesia complications. Likelihood of success in alleviating the patient's condition was discussed.  Patient wanted to discuss other options of management at this point.  Discussed medical management of possible ectopic pregnancy with methotrexate.  The risks of methotrexate were reviewed including failure requiring repeat dosing or eventual surgery. She understands that methotrexate involves frequent return visits to monitor lab values and that she remains at risk of ectopic rupture until her beta is less than assay. ?The patient opts to proceed with methotrexate.  She has no history of hepatic or renal dysfunction, has normal BUN/Cr/LFT's/platelets.  She is felt to be reliable for follow-up. Side effects of  photosensitivity & GI upset were discussed.  She knows to avoid direct sunlight and abstain from alcohol, NSAIDs and sexual intercourse for two weeks. She was counseled to discontinue any MVI with folic acid. ?She understands to follow up on Day 4 (05/06/23) and Day 7 (05/09/23) for repeat BHCG and was given the instruction sheet.  Patient and her partner discussed the options. They have 25 year old twins and 78 month old twins, really do not want surgery if it is not absolutely needing.  She desires medical management, understands risks of this modality.  With her permission, scheduled surgery was cancelled.  MAU providers and nursing staff were notified, and patient was taken to MAU for her methotrexate therapy.    Jaynie Collins, MD, FACOG Obstetrician & Gynecologist, [facility] for [employer], [facility]

## 2023-05-03 NOTE — MAU Provider Note (Signed)
Faculty Practice OB/GYN Attending MAU Note  Chief Complaint: Methotrexate Injection    Event Date/Time   First Provider Initiated Contact with Patient 05/03/23 1610      SUBJECTIVE Autumn Duarte is a 27 y.o. [id number] at [redacted]w[redacted]d by LMP with known ruptured ectopic pregnancy, hemodynamically stable for over 48 hours,who was scheduled for surgery today but now wants medical management with methotrexate. Please see previous notes including my earlier note today for further details.    Past Medical History:  Diagnosis Date   Asthma    last used inhaler 2 months ago   Bronchitis    Eczema    History of postpartum hemorrhage 07/18/2022   History of preterm delivery 07/08/2022   Hypertension    Obesity    Seasonal allergies    OB History  Gravida Para Term Preterm AB Living  3 2 1 1   4   SAB IAB Ectopic Multiple Live Births        2 4    # Outcome Date GA Lbr Len/2nd Weight Sex Type Anes PTL Lv  3 Current           2A Preterm 07/18/22 [redacted]w[redacted]d / 00:09 5 lb 10 oz (2.55 kg) M Vag-Spont None  LIV     Birth Comments: apneic and bradycardic requiring PPV- see note.  2B Preterm 07/18/22 [redacted]w[redacted]d / 00:45 5 lb 6.8 oz (2.46 kg) F Vag-Spont None  LIV  1A Term 12/04/18 [redacted]w[redacted]d 11:32 / 01:20 6 lb 1.5 oz (2.764 kg) F Vag-Spont EPI  LIV     Birth Comments: WDL  1B Term 12/04/18 [redacted]w[redacted]d 11:32 / 01:40 5 lb 10.1 oz (2.554 kg) F Vag-Spont EPI  LIV     Birth Comments: WDL   Past Surgical History:  Procedure Laterality Date   WISDOM TOOTH EXTRACTION     Social History   Socioeconomic History   Marital status: Single    Spouse name: Not on file   Number of children: Not on file   Years of education: Not on file   Highest education level: Not on file  Occupational History   Not on file  Tobacco Use   Smoking status: Former    Current packs/day: 0.00    Types: Cigarettes    Quit date: 03/19/2018    Years since quitting: 5.1   Smokeless tobacco: Never  Vaping Use   Vaping status: Every Day  Substance  and Sexual Activity   Alcohol use: Not Currently   Drug use: Never   Sexual activity: Yes    Birth control/protection: None  Other Topics Concern   Not on file  Social History Narrative   Not on file   Social Determinants of Health   Financial Resource Strain: High Risk (12/21/2021)   [facility] visits prior to 10/01/2022.   Overall Financial Resource Strain (CARDIA)    Difficulty of Paying Living Expenses: Hard  Food Insecurity: No Food Insecurity (07/18/2022)   Hunger Vital Sign    Worried About Running Out of Food in the Last Year: Never true    Ran Out of Food in the Last Year: Never true  Transportation Needs: No Transportation Needs (07/18/2022)   PRAPARE - Administrator, Civil Service (Medical): No    Lack of Transportation (Non-Medical): No  Physical Activity: Insufficiently Active (12/21/2021)   [facility], [facility]  visits prior to 10/01/2022., [facility] visits prior to 10/01/2022.   Exercise Vital Sign    Days of Exercise per Week: 2 days    Minutes of Exercise per Session: 30 min  Stress: No Stress Concern Present (12/21/2021)   [facility] prior to 10/01/2022., [facility] visits prior to 10/01/2022.   [employer] of Occupational Health - Occupational Stress Questionnaire    Feeling of Stress : Only a little  Social Connections: Moderately Isolated (12/21/2021)   [facility] from [facility] visits prior to 10/01/2022., [facility] visits prior to 10/01/2022., [facility]   Social Connection and Isolation Panel [NHANES]    Frequency of Communication with Friends and Family: More than three times a week     Frequency of Social Gatherings with Friends and Family: Never    Attends Religious Services: Never    Database administrator or Organizations: No    Attends Banker Meetings: Never    Marital Status: Living with partner  Intimate Partner Violence: Not At Risk (07/18/2022)   Humiliation, Afraid, Rape, and Kick questionnaire    Fear of Current or Ex-Partner: No    Emotionally Abused: No    Physically Abused: No    Sexually Abused: No   No current facility-administered medications on file prior to encounter.   No current outpatient medications on file prior to encounter.   Allergies  Allergen Reactions   Shellfish Allergy Itching, Shortness Of Breath and Hives    ROS: Pertinent items in HPI  OBJECTIVE BP (!) 149/85 (BP Location: Right Arm)   Pulse (!) 103   Temp 98.4 F (36.9 C) (Oral)   Resp 18   Ht 5\' 4"  (1.626 m)   Wt (!) 359 lb 3.2 oz (162.9 kg)   LMP 04/09/2023 Comment: delivered 07/18/22  SpO2 100%   BMI 61.66 kg/m  CONSTITUTIONAL: Well-developed, well-nourished female in no acute distress.  HENT:  Normocephalic, atraumatic, External right and left ear normal. Oropharynx is clear and moist EYES: Conjunctivae and EOM are normal. Pupils are equal, round, and reactive to light. No scleral icterus.  NECK: Normal range of motion, supple, no masses.  Normal thyroid.  SKIN: Skin is warm and dry. No rash noted. Not diaphoretic. No erythema. No pallor. NEUROLGIC: Alert and oriented to person, place, and time. Normal reflexes, muscle tone coordination. No cranial nerve deficit noted. PSYCHIATRIC: Normal mood and affect. Normal behavior. Normal judgment and thought content. CARDIOVASCULAR: Normal heart rate noted RESPIRATORY: Effort and breath sounds normal, no problems with respiration noted. ABDOMEN: Soft, obese, normal bowel sounds, no distention noted.  Mild LLQ tenderness, rebound or guarding.  PELVIC: Deferred. MUSCULOSKELETAL: Normal range of motion. No  tenderness.  No cyanosis, clubbing, or edema.  2+ distal pulses.  LAB RESULTS    Latest Ref Rng & Units 05/03/2023    4:16 PM 05/02/2023    4:49 PM 05/01/2023   10:33 AM  CBC  WBC 4.0 - 10.5 K/uL 9.1  9.3  8.5   Hemoglobin 12.0 - 15.0 g/dL 16.1  09.6  04.5   Hematocrit 36.0 - 46.0 % 35.4  34.3  37.7   Platelets 150 - 400 K/uL 288  281  280       Latest Ref Rng & Units 05/02/2023    4:49 PM 05/01/2023   10:33 AM 07/31/2022  4:47 AM  CMP  Glucose 70 - 99 mg/dL 79  161  95   BUN 6 - 20 mg/dL 10  11  13    Creatinine 0.44 - 1.00 mg/dL 0.96  0.45  4.09   Sodium 135 - 145 mmol/L 137  138  139   Potassium 3.5 - 5.1 mmol/L 3.6  3.8  3.6   Chloride 98 - 111 mmol/L 103  103  108   CO2 22 - 32 mmol/L 23  27  24    Calcium 8.9 - 10.3 mg/dL 8.7  8.8  8.7   Total Protein 6.5 - 8.1 g/dL 7.1  7.2  7.4   Total Bilirubin 0.3 - 1.2 mg/dL 0.6  0.1  0.4   Alkaline Phos 38 - 126 U/L 29  31  45   AST 15 - 41 U/L 20  23  22    ALT 0 - 44 U/L 25  28  21     HCG 05/01/23 77 > 05/02/23 69 > 05/03/23 59  IMAGING MR PELVIS WO CONTRAST  Addendum Date: 05/02/2023   ADDENDUM REPORT: 05/02/2023 14:02 ADDENDUM: Critical Value/emergent results were called by telephone at the time of interpretation on 05/02/2023 at 1:40 pm to provider Jaynie Collins , who verbally acknowledged these results. Electronically Signed   By: Charline Bills M.D.   On: 05/02/2023 14:02   Result Date: 05/02/2023 CLINICAL DATA:  Pregnant, pelvic pain, left adnexal mass EXAM: MRI PELVIS WITHOUT CONTRAST TECHNIQUE: Multiplanar multisequence MR imaging of the pelvis was performed. No intravenous contrast was administered. COMPARISON:  OB ultrasound dated 05/01/2023 FINDINGS: Urinary Tract:  Bladder is within normal limits. Bowel:  Visualized bowel is grossly unremarkable. Vascular/Lymphatic: No evidence of aneurysm. No suspicious abdominal lymphadenopathy. Reproductive: Uterus is within normal limits. Endometrial complex measures 8 mm. No evidence  of intrauterine gestation. Right ovary is within normal limits, measuring 2.5 x 1.8 cm (series 5/image 18). Left ovary is within normal limits, measuring 2.7 x 1.7 cm (series 5/image 13). Adjacent 6.7 x 5.5 cm left adnexal mass (series 3/image 16), favoring tubal ectopic pregnancy (series 2/image 18), with surrounding sentinel clot/hemorrhage (series 18/image 41). Other:  Moderate pelvic fluid with layering hemorrhage. Musculoskeletal: No focal osseous lesions. IMPRESSION: 6.7 cm left adnexal mass, favoring tubal ectopic pregnancy, with surrounding sentinel clot/hemorrhage. Moderate pelvic fluid with layering hemorrhage, raising concern for rupture. Electronically Signed: By: Charline Bills M.D. On: 05/02/2023 12:47   US OB LESS THAN 14 WEEKS WITH OB TRANSVAGINAL  Result Date: 05/01/2023 CLINICAL DATA:  Bleeding and cramping.  Beta HCG = 77. EXAM: OBSTETRIC <14 WK [place] AND TRANSVAGINAL OB US TECHNIQUE: Both transabdominal and transvaginal ultrasound examinations were performed for complete evaluation of the gestation as well as the maternal uterus, adnexal regions, and pelvic cul-de-sac. Transvaginal technique was performed to assess early pregnancy. COMPARISON:  None Available. FINDINGS: Intrauterine gestational sac: None Yolk sac:  Not Visualized. Embryo:  Not Visualized. Maternal uterus/adnexae: Uterus measures 6.3 cm in sagittal dimension. Avascular, heterogeneous left adnexal mass measures 8.0 x 7.4 x 4.5 cm. Left ovary is not seen. Right ovary measures 2.1 x 2.0 x 1.7 cm. Moderate volume pelvic free fluid. IMPRESSION: 1. Pregnancy of unknown location. Heterogeneous left adnexal mass without vascularity on Doppler ultrasound examination. A left ovary is not confidently identified. Differential includes left adnexal ectopic pregnancy versus a torsed left ovary. 2. Moderate volume pelvic free fluid. Critical Value/emergent results were called by telephone at the time of interpretation on 05/01/2023 at 1:23 pm  to  provider [facility] FONDAW , who verbally acknowledged these results. Electronically Signed   By: Agustin Cree M.D.   On: 05/01/2023 13:25    MAU COURSE The risks of methotrexate were reviewed including failure requiring repeat dosing or eventual surgery. She understands that methotrexate involves frequent return visits to monitor lab values and that she remains at risk of ectopic rupture until her beta is less than assay. ?The patient opts to proceed with methotrexate.  She has no history of hepatic or renal dysfunction, has normal BUN/Cr/LFT's/platelets.  She is felt to be reliable for follow-up. Side effects of photosensitivity & GI upset were discussed.  She knows to avoid direct sunlight and abstain from alcohol, NSAIDs and sexual intercourse for two weeks. She was counseled to discontinue any MVI with folic acid. ?She understands to follow up on D4 (05/06/23) and D7 (05/09/23) for repeat BHCG and was given the instruction sheet. ?Strict ectopic precautions were reviewed, the patient knows to call with any abdominal pain, vomiting, fainting, or any concerns with her health.   Methotrexate was administered to patient.  ASSESSMENT 1. Ruptured ectopic pregnancy   2. Ectopic pregnancy, unspecified location, unspecified whether intrauterine pregnancy present   3. [redacted] weeks gestation of pregnancy     PLAN Patient is s/p methotrexate therapy for ectopic pregnancy. Stable hemoglobin, no signs of ongoing bleeding.  Stable vital signs. Hemodynamically stable since 05/01/23. Pain medications prescribed as needed, but cautioned to come back for uncontrolled pain. She will return to MAU on 05/06/23 for HCG check for Day 4 as instructed or earlier if needed. Discharged home in stable condition.  Allergies as of 05/03/2023       Reactions   Shellfish Allergy Itching, Shortness Of Breath, Hives        Medication List     TAKE these medications    acetaminophen 500 MG tablet Commonly known as:  TYLENOL Take 2 tablets (1,000 mg total) by mouth every 6 (six) hours as needed for moderate pain or mild pain.   docusate sodium 100 MG capsule Commonly known as: COLACE Take 1 capsule (100 mg total) by mouth 2 (two) times daily as needed for mild constipation or moderate constipation.   oxyCODONE 5 MG immediate release tablet Commonly known as: Oxy IR/ROXICODONE Take 1 tablet (5 mg total) by mouth every 4 (four) hours as needed for severe pain or breakthrough pain.        Evaluation does not show pathology that would require ongoing emergent intervention or inpatient treatment. Patient is hemodynamically stable and mentating appropriately. Discussed findings and plan with patient, who agrees with care plan. All questions answered. Return precautions discussed and outpatient follow up recommendations given.  Tereso Newcomer, MD 05/03/2023 7:10 PM

## 2023-05-03 NOTE — MAU Note (Signed)
.  Autumn Duarte is a 27 y.o. at [redacted]w[redacted]d here in MAU reporting: she's here to Methotrexate.  injection.  Reports having dull constant lower abdominal pain in the LLQ and spotting LMP: NA Onset of complaint: today Pain score: 5 There were no vitals filed for this visit.   FHT:NA Lab orders placed from triage:   CBC & HCG

## 2023-05-04 LAB — TYPE AND SCREEN
ABO/RH(D): O POS
Antibody Screen: POSITIVE
Donor AG Type: NEGATIVE
Donor AG Type: NEGATIVE
PT AG Type: NEGATIVE
Unit division: 0
Unit division: 0

## 2023-05-04 LAB — BPAM RBC
Blood Product Expiration Date: 202410292359
Blood Product Expiration Date: 202410302359
Unit Type and Rh: 5100
Unit Type and Rh: 5100

## 2023-05-08 NOTE — H&P (Signed)
      Faculty Practice OB/GYN Attending Note   Patient is 27 y.o. [phone] with known ectopic pregnancy definitively diagnosed on 05/02/23, here for scheduled surgical intervention. Of note, this has been an ongoing situation since 05/01/23, patient is without pain. She is hemodynamically stable, has no signs of ongoing bleeding. Hemoglobin stable around 12 over two days.  She declined surgical intervention on 05/01/23, and was going to be scheduled for surgery late yesterday, but patient wanted it moved until today.  Today, she came in with her significant other.  Denies any pain, dizziness, bleeding or other concerns.   I met with them in Short Stay area and discussed proposed surgery with them.  The risks of surgery were discussed in detail with the patient including but not limited to: bleeding which may require transfusion or reoperation; infection which may require antibiotics; injury to surrounding organs which may involve bowel, bladder, ureters; need for additional procedures including laparotomy or subsequent procedures secondary to abnormal pathology; thromboembolic phenomenon; difficulty of surgery secondary to morbidly obese habitus; surgical site problems; death and other postoperative or anesthesia complications. Likelihood of success in alleviating the patient's condition was discussed.  Patient wanted to discuss other options of management at this point.   Discussed medical management of possible ectopic pregnancy with methotrexate.  The risks of methotrexate were reviewed including failure requiring repeat dosing or eventual surgery. She understands that methotrexate involves frequent return visits to monitor lab values and that she remains at risk of ectopic rupture until her beta is less than assay. ?The patient opts to proceed with methotrexate.  She has no history of hepatic or renal dysfunction, has normal BUN/Cr/LFT's/platelets.  She is felt to be reliable for follow-up. Side effects of  photosensitivity & GI upset were discussed.  She knows to avoid direct sunlight and abstain from alcohol, NSAIDs and sexual intercourse for two weeks. She was counseled to discontinue any MVI with folic acid. ?She understands to follow up on Day 4 (05/06/23) and Day 7 (05/09/23) for repeat BHCG and was given the instruction sheet.   Patient and her partner discussed the options. They have 25 year old twins and 39 month old twins, really do not want surgery if it is not absolutely needing.  She desires medical management, understands risks of this modality.  With her permission, scheduled surgery was cancelled.  MAU providers and nursing staff were notified, and patient was taken to MAU for her methotrexate therapy.       Jaynie Collins, MD, FACOG Obstetrician & Gynecologist, [facility] for [employer], [facility]

## 2023-05-09 ENCOUNTER — Telehealth: Payer: Self-pay

## 2023-05-09 NOTE — Telephone Encounter (Addendum)
-----   Message from Jaynie Collins sent at 05/09/2023  1:23 PM EDT ----- Regarding: Please contact patient Autumn Duarte was supposed to come for Day 4 and Day 7 (today) HCG after methotrexate.  Please make sure Autumn Duarte is okay! Needs to come in for blood draw.  Thank you!   Jaynie Collins, MD   ---------------------   Called pt; VM left stating I am calling to schedule urgent follow-up visit. Reviewed option for MAU if any emergent concerns. We will attempt to contact patient a second time.

## 2023-05-11 NOTE — Telephone Encounter (Signed)
Called pt and she did not answer. I left voicemail message stating that I am calling regarding a very important missed appointment. We need her to schedule appointment in our office or go to [facility] today for proper follow up of her medical condition. This cannot be ignored. She may call back if she has questions.

## 2023-05-12 NOTE — Telephone Encounter (Signed)
Called pt for third attempt; VM left and MyChart message sent.

## 2023-06-12 ENCOUNTER — Telehealth: Payer: Self-pay

## 2023-06-13 NOTE — Telephone Encounter (Signed)
Alert received that patient has not read MyChart message sent regarding ectopic pregnancy follow up. Called pt. Pt agreeable to scheduling visit with provider to follow up. Offered HCG lab visit prior to for confirmation pregnancy has resolved. Pt only able to come into office once due to work schedule. Front office will contact patient to schedule. Would also like to discuss option for IUD.

## 2023-06-19 ENCOUNTER — Other Ambulatory Visit: Payer: Self-pay | Admitting: Certified Nurse Midwife

## 2023-06-19 ENCOUNTER — Other Ambulatory Visit: Payer: Self-pay

## 2023-06-19 ENCOUNTER — Encounter: Payer: Self-pay | Admitting: Obstetrics and Gynecology

## 2023-06-19 ENCOUNTER — Ambulatory Visit: Payer: Medicaid Other | Admitting: Obstetrics and Gynecology

## 2023-06-19 VITALS — BP 138/106 | HR 80 | Ht 64.0 in | Wt 354.8 lb

## 2023-06-19 DIAGNOSIS — O009 Unspecified ectopic pregnancy without intrauterine pregnancy: Secondary | ICD-10-CM

## 2023-06-19 DIAGNOSIS — Z3A01 Less than 8 weeks gestation of pregnancy: Secondary | ICD-10-CM

## 2023-06-19 DIAGNOSIS — Z30012 Encounter for prescription of emergency contraception: Secondary | ICD-10-CM

## 2023-06-19 MED ORDER — ULIPRISTAL ACETATE 30 MG PO TABS: 1.0000 | ORAL_TABLET | Freq: Once | ORAL | 0 refills | Status: AC

## 2023-06-19 NOTE — Progress Notes (Signed)
   RETURN GYNECOLOGY VISIT  Subjective:  Autumn Duarte is a 27 y.o. [id number] with LMP 06/06/23 presenting for follow up of ectopic pregnancy  Pt had ruptured ectopic pregnancy but declined surgical management and received methotrexate on 05/03/23. bHCG 59. She did not follow up for D4 or D7 levels and has not had a BhCG since.  Today, reports she is doing well. No pain or bleeding. No complaints. She is interested in IUD for contraception. Had unprotected IC yesterday 06/19/23.  I personally reviewed the following: BhCG 05/03/23 59 Pelvic MRI 05/02/23 6.7cm L adnexal mass favoring L tubal ectopic pregnancy with surrounding clot/hemorrhage as well as moderate pelvic fluid c/f rupture Pelvic US 05/01/23 PUL w/ L adnexal mass c/f ectopic pregnancy vs torsion, moderate pelvic free fluid  Objective:   Vitals:   06/19/23 0853  BP: (!) 140/102  Pulse: 80  Weight: (!) 354 lb 12.8 oz (160.9 kg)  Height: 5\' 4"  (1.626 m)   General:  Alert, oriented and cooperative. Patient is in no acute distress.  Skin: Skin is warm and dry. No rash noted.   Cardiovascular: Normal heart rate noted  Respiratory: Normal respiratory effort, no problems with respiration noted  Abdomen: Soft, non-tender, non-distended    Assessment and Plan:  Autumn Duarte is a 27 y.o. with ruptured ectopic pregnancy, recovering well s/p methotrexate x 1  1. Ectopic pregnancy without intrauterine pregnancy, unspecified location F/u BhCG to confirm resolution of ectopic - hCG, quantitative, pregnancy; Future  2. Emergency contraceptive counseling and prescription Unprotected IC yesterday. Discussed r/b of EC, pt accepts Will return in 2 weeks for LNG IUD insertion. Worked well for her in the past but had really long strings that were bothersome. Would like to try it again. -     ulipristal acetate (ELLA) 30 MG tablet; Take 1 tablet (30 mg total) by mouth once for 1 dose.  Return in about 2 weeks (around 07/03/2023) for Mirena IUD  insertion.  Future Appointments  Date Time Provider Department Center  07/04/2023  9:00 AM Clayborne Dana, NP [facility]   Lennart Pall, MD

## 2023-06-20 LAB — BETA HCG QUANT (REF LAB): hCG Quant: <1 m[IU]/mL

## 2023-07-04 ENCOUNTER — Ambulatory Visit: Payer: Medicaid Other | Admitting: Family Medicine

## 2023-07-14 ENCOUNTER — Ambulatory Visit: Payer: Medicaid Other | Admitting: Obstetrics and Gynecology

## 2023-07-20 ENCOUNTER — Emergency Department (HOSPITAL_BASED_OUTPATIENT_CLINIC_OR_DEPARTMENT_OTHER)
Admission: EM | Admit: 2023-07-20 | Discharge: 2023-07-21 | Disposition: A | Discharge status: Home / Self Care | Payer: Medicaid Other | Attending: Emergency Medicine | Admitting: Emergency Medicine

## 2023-07-20 ENCOUNTER — Encounter (HOSPITAL_BASED_OUTPATIENT_CLINIC_OR_DEPARTMENT_OTHER): Payer: Self-pay

## 2023-07-20 ENCOUNTER — Emergency Department (HOSPITAL_BASED_OUTPATIENT_CLINIC_OR_DEPARTMENT_OTHER): Payer: Medicaid Other

## 2023-07-20 ENCOUNTER — Other Ambulatory Visit: Payer: Self-pay

## 2023-07-20 DIAGNOSIS — J45909 Unspecified asthma, uncomplicated: Secondary | ICD-10-CM | POA: Diagnosis not present

## 2023-07-20 DIAGNOSIS — Z7951 Long term (current) use of inhaled steroids: Secondary | ICD-10-CM | POA: Insufficient documentation

## 2023-07-20 DIAGNOSIS — R0789 Other chest pain: Secondary | ICD-10-CM | POA: Insufficient documentation

## 2023-07-20 DIAGNOSIS — R110 Nausea: Secondary | ICD-10-CM | POA: Diagnosis not present

## 2023-07-20 DIAGNOSIS — M546 Pain in thoracic spine: Secondary | ICD-10-CM | POA: Insufficient documentation

## 2023-07-20 LAB — COMPREHENSIVE METABOLIC PANEL
ALT: 23 U/L (ref 0–44)
AST: 22 U/L (ref 15–41)
Albumin: 3.7 g/dL (ref 3.5–5.0)
Alkaline Phosphatase: 32 U/L — ABNORMAL LOW (ref 38–126)
Anion gap: 7 (ref 5–15)
BUN: 13 mg/dL (ref 6–20)
CO2: 25 mmol/L (ref 22–32)
Calcium: 9.3 mg/dL (ref 8.9–10.3)
Chloride: 106 mmol/L (ref 98–111)
Creatinine, Ser: 0.87 mg/dL (ref 0.44–1.00)
GFR, Estimated: >60 mL/min (ref >60)
Glucose, Bld: 95 mg/dL (ref 70–99)
Potassium: 3.6 mmol/L (ref 3.5–5.1)
Sodium: 138 mmol/L (ref 135–145)
Total Bilirubin: 0.5 mg/dL (ref <1.2)
Total Protein: 7.6 g/dL (ref 6.5–8.1)

## 2023-07-20 LAB — CBC
HCT: 36.9 % (ref 36.0–46.0)
Hemoglobin: 12.7 g/dL (ref 12.0–15.0)
MCH: 29.1 pg (ref 26.0–34.0)
MCHC: 34.4 g/dL (ref 30.0–36.0)
MCV: 84.6 fL (ref 80.0–100.0)
Platelets: 297 10*3/uL (ref 150–400)
RBC: 4.36 MIL/uL (ref 3.87–5.11)
RDW: 12.9 % (ref 11.5–15.5)
WBC: 11.2 10*3/uL — ABNORMAL HIGH (ref 4.0–10.5)
nRBC: 0 % (ref 0.0–0.2)

## 2023-07-20 LAB — LIPASE, BLOOD: Lipase: 25 U/L (ref 11–51)

## 2023-07-20 LAB — TROPONIN I (HIGH SENSITIVITY): Troponin I (High Sensitivity): 3 ng/L (ref <18)

## 2023-07-20 MED ORDER — LIDOCAINE VISCOUS HCL 2 % MT SOLN
15.0000 mL | Freq: Once | OROMUCOSAL | Status: AC
  Administered 2023-07-20: 15 mL via ORAL
  Filled 2023-07-20: qty 15

## 2023-07-20 MED ORDER — ALUM & MAG HYDROXIDE-SIMETH 200-200-20 MG/5ML PO SUSP
30.0000 mL | Freq: Once | ORAL | Status: AC
  Administered 2023-07-20: 30 mL via ORAL
  Filled 2023-07-20: qty 30

## 2023-07-20 MED ORDER — MORPHINE SULFATE (PF) 4 MG/ML IV SOLN
4.0000 mg | Freq: Once | INTRAVENOUS | Status: AC
Start: 2023-07-20 — End: 2023-07-20
  Administered 2023-07-20: 4 mg via INTRAVENOUS
  Filled 2023-07-20: qty 1

## 2023-07-20 MED ORDER — KETOROLAC TROMETHAMINE 15 MG/ML IJ SOLN
15.0000 mg | Freq: Once | INTRAMUSCULAR | Status: AC
  Administered 2023-07-20: 15 mg via INTRAVENOUS
  Filled 2023-07-20: qty 1

## 2023-07-20 MED ORDER — METHOCARBAMOL 500 MG PO TABS
500.0000 mg | ORAL_TABLET | Freq: Two times a day (BID) | ORAL | 0 refills | Status: DC
Start: 2023-07-20 — End: 2024-03-20

## 2023-07-20 MED ORDER — ONDANSETRON HCL 4 MG/2ML IJ SOLN
4.0000 mg | Freq: Once | INTRAMUSCULAR | Status: AC
  Administered 2023-07-20: 4 mg via INTRAVENOUS
  Filled 2023-07-20: qty 2

## 2023-07-20 NOTE — ED Triage Notes (Signed)
Pt arrives with c/o chest and back pain that started this evening. Per pt, the pain was started after she ate and pain is worse with a deep breath. Pt reports nausea. Pt denies SOB.

## 2023-07-20 NOTE — ED Notes (Signed)
Patient transported to X-ray 

## 2023-07-20 NOTE — Discharge Instructions (Addendum)

## 2023-07-20 NOTE — ED Provider Notes (Signed)
[place] [facility] Provider Note   CSN: [id number] Arrival date & time: 07/20/23  2052     History  Chief Complaint  Patient presents with   Chest Pain    Shaune L Hegstrom is a 27 y.o. female with past medical history significant for severe obesity, depression, adjustment disorder, asthma who presents concern for central to left-sided chest pain that started at around 4:30 PM today she describes as sharp/pressure/tightness.  She endorses some nausea associated.  Patient reports that she was seen previously for similar and ended up having some gallbladder dysfunction.   Chest Pain      Home Medications Prior to Admission medications   Medication Sig Start Date End Date Taking? Authorizing Provider  methocarbamol (ROBAXIN) 500 MG tablet Take 1 tablet (500 mg total) by mouth 2 (two) times daily. 07/20/23  Yes Rain Friedt H, PA-C  acetaminophen (TYLENOL) 500 MG tablet Take 2 tablets (1,000 mg total) by mouth every 6 (six) hours as needed for moderate pain or mild pain. 05/03/23   Anyanwu, Jethro Bastos, MD  albuterol (VENTOLIN) 2 MG/5ML syrup Take 2 mg by mouth 3 (three) times daily.    [provider]  docusate sodium (COLACE) 100 MG capsule Take 1 capsule (100 mg total) by mouth 2 (two) times daily as needed for mild constipation or moderate constipation. Patient not taking: Reported on 06/19/2023 05/03/23   Anyanwu, Jethro Bastos, MD  oxyCODONE (OXY IR/ROXICODONE) 5 MG immediate release tablet Take 1 tablet (5 mg total) by mouth every 4 (four) hours as needed for severe pain or breakthrough pain. Patient not taking: Reported on 06/19/2023 05/03/23   Tereso Newcomer, MD      Allergies    Shellfish allergy    Review of Systems   Review of Systems  Cardiovascular:  Positive for chest pain.  All other systems reviewed and are negative.   Physical Exam Updated Vital Signs BP (!) 154/114 (BP Location: Left Arm)   Pulse 88   Temp 98.2  F (36.8 C) (Oral)   Resp 20   Wt (!) 160.6 kg   LMP 04/09/2023 Comment: delivered 07/18/22  SpO2 97%   BMI 60.76 kg/m  Physical Exam Vitals and nursing note reviewed.  Constitutional:      General: She is not in acute distress.    Appearance: Normal appearance. She is obese.  HENT:     Head: Normocephalic and atraumatic.  Eyes:     General:        Right eye: No discharge.        Left eye: No discharge.  Cardiovascular:     Rate and Rhythm: Normal rate and regular rhythm.     Heart sounds: No murmur heard.    No friction rub. No gallop.  Pulmonary:     Effort: Pulmonary effort is normal.     Breath sounds: Normal breath sounds.     Comments: No wheezing, rhonchi, stridor, rales Abdominal:     General: Bowel sounds are normal.     Palpations: Abdomen is soft.  Musculoskeletal:     Comments: Patient has focal tenderness to palpation in the mid back on exam, but intact strength 5/5 bilateral upper and lower extremities  Skin:    General: Skin is warm and dry.     Capillary Refill: Capillary refill takes less than 2 seconds.  Neurological:     Mental Status: She is alert and oriented to person, place, and time.  Psychiatric:  Mood and Affect: Mood normal.        Behavior: Behavior normal.     ED Results / Procedures / Treatments   Labs (all labs ordered are listed, but only abnormal results are displayed) Labs Reviewed  COMPREHENSIVE METABOLIC PANEL - Abnormal; Notable for the following components:      Result Value   Alkaline Phosphatase 32 (*)    All other components within normal limits  CBC - Abnormal; Notable for the following components:   WBC 11.2 (*)    All other components within normal limits  LIPASE, BLOOD  URINALYSIS, ROUTINE W REFLEX MICROSCOPIC  PREGNANCY, URINE  TROPONIN I (HIGH SENSITIVITY)  TROPONIN I (HIGH SENSITIVITY)    EKG EKG Interpretation Date/Time:  Thursday July 20 2023 21:12:31 EST Ventricular Rate:  89 PR  Interval:  168 QRS Duration:  89 QT Interval:  340 QTC Calculation: 414 R Axis:   15  Text Interpretation: Sinus rhythm No old tracing to compare Confirmed by Meridee Score [phone]) on 07/20/2023 9:14:43 PM  Radiology DG Chest 2 View Result Date: 07/20/2023 CLINICAL DATA:  Chest pain EXAM: CHEST - 2 VIEW COMPARISON:  01/22/2020 FINDINGS: The heart size and mediastinal contours are within normal limits. Both lungs are clear. The visualized skeletal structures are unremarkable. IMPRESSION: No active cardiopulmonary disease. Electronically Signed   By: Jasmine Pang M.D.   On: 07/20/2023 23:18    Procedures Procedures    Medications Ordered in ED Medications  alum & mag hydroxide-simeth (MAALOX/MYLANTA) 200-200-20 MG/5ML suspension 30 mL (30 mLs Oral Given 07/20/23 2146)    And  lidocaine (XYLOCAINE) 2 % viscous mouth solution 15 mL (15 mLs Oral Given 07/20/23 2146)  morphine (PF) 4 MG/ML injection 4 mg (4 mg Intravenous Given 07/20/23 2153)  ondansetron (ZOFRAN) injection 4 mg (4 mg Intravenous Given 07/20/23 2150)  ketorolac (TORADOL) 15 MG/ML injection 15 mg (15 mg Intravenous Given 07/20/23 2319)    ED Course/ Medical Decision Making/ A&P                                 Medical Decision Making Amount and/or Complexity of Data Reviewed Labs: ordered. Radiology: ordered.  Risk OTC drugs. Prescription drug management.   This patient is a 27 y.o. female  who presents to the ED for concern of chest pain, shortness of breath.   Differential diagnoses prior to evaluation: The emergent differential diagnosis includes, but is not limited to,  ACS, AAS, PE, Mallory-Weiss, Boerhaave's, Pneumonia, acute bronchitis, asthma or COPD exacerbation, anxiety, MSK pain or traumatic injury to the chest, acid reflux versus other . This is not an exhaustive differential.   Past Medical History / Co-morbidities / Social History: Obesity, eczema, bronchitis, hypertension  Additional  history: Chart reviewed. Pertinent results include: Reviewed lab work, imaging from patient's previous emergency department visits  Physical Exam: Physical exam performed. The pertinent findings include: Patient with tenderness in the mid back which may explain her presentation of back pain, she has no accessory breath sounds, she has no tenderness to palpation of the chest wall.  Lab Tests/Imaging studies: I personally interpreted labs/imaging and the pertinent results include: CBC with mild leukocytosis, blood cells 11.2, lipase unremarkable, CMP unremarkable, troponin negative x 1, will obtain delta troponin given the duration of her chest pain..  I dependently interpreted plain film radiograph of the chest which shows no evidence of acute intrathoracic abnormality.  I agree with the radiologist  interpretation.  Cardiac monitoring: EKG obtained and interpreted by myself and attending physician which shows: Sinus rhythm, no acute ST-T changes   Medications: I ordered medication including morphine, Zofran for nausea, chest pain, GI cocktail for suspected possible related to GERD symptoms, Toradol for possible MSK etiology.  On reevaluation patient with improvement of pain..  I have reviewed the patients home medicines and have made adjustments as needed.   Since her delta troponin is negative, pain is improved, likely musculoskeletal in nature.  She is stable for discharge at this time.  Discussed extensive return precautions for return of chest pain.  She is chest pain-free at time of evaluation, she reports some mild ongoing middle thoracic back pain which I think is musculoskeletal in nature.  Low clinical suspicion for dissection based on her presentation, vital signs, no evidence of pancreatitis or other abnormality.  Final Clinical Impression(s) / ED Diagnoses Final diagnoses:  Atypical chest pain  Acute midline thoracic back pain    Rx / DC Orders ED Discharge Orders           Ordered    methocarbamol (ROBAXIN) 500 MG tablet  2 times daily        07/20/23 2354              Lelani Garnett, Harrel Carina, PA-C 07/21/23 0004    Terrilee Files, MD 07/21/23 1019

## 2023-07-21 LAB — TROPONIN I (HIGH SENSITIVITY): Troponin I (High Sensitivity): 3 ng/L (ref <18)

## 2023-07-21 NOTE — ED Notes (Signed)

## 2023-08-22 ENCOUNTER — Ambulatory Visit: Payer: Medicaid Other | Admitting: Family Medicine

## 2023-08-22 DIAGNOSIS — Z0000 Encounter for general adult medical examination without abnormal findings: Secondary | ICD-10-CM

## 2023-09-23 IMAGING — DX DG LUMBAR SPINE COMPLETE 4+V
5 series · 5 of 5 positions shown · non-contrast
Comparison: None.

CLINICAL DATA: Low back pain.

EXAM:
LUMBAR SPINE - COMPLETE 4+ VIEW

[l-spine ap]
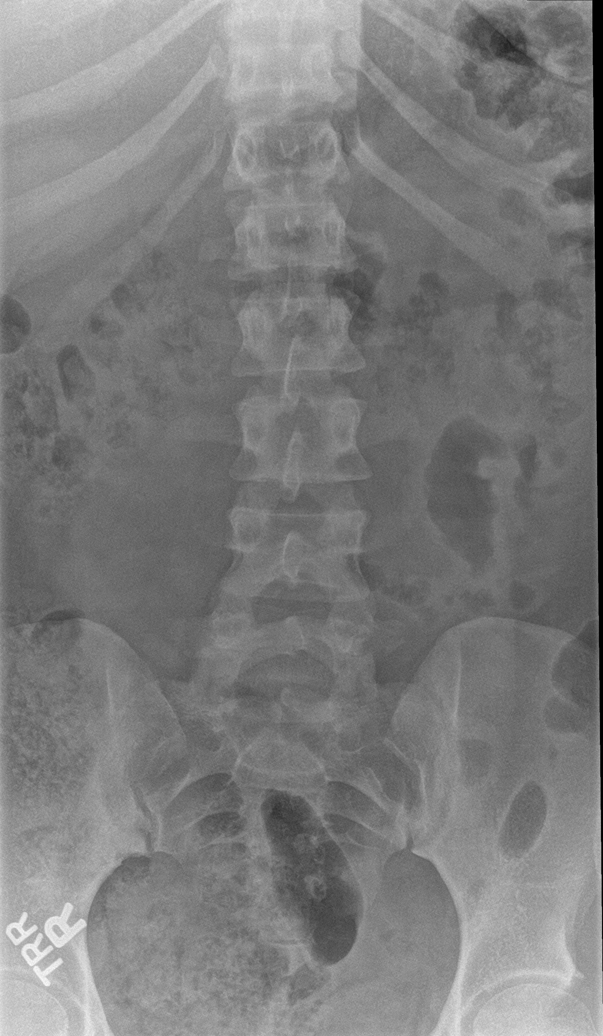

[l-spine obl (1 of 2)]
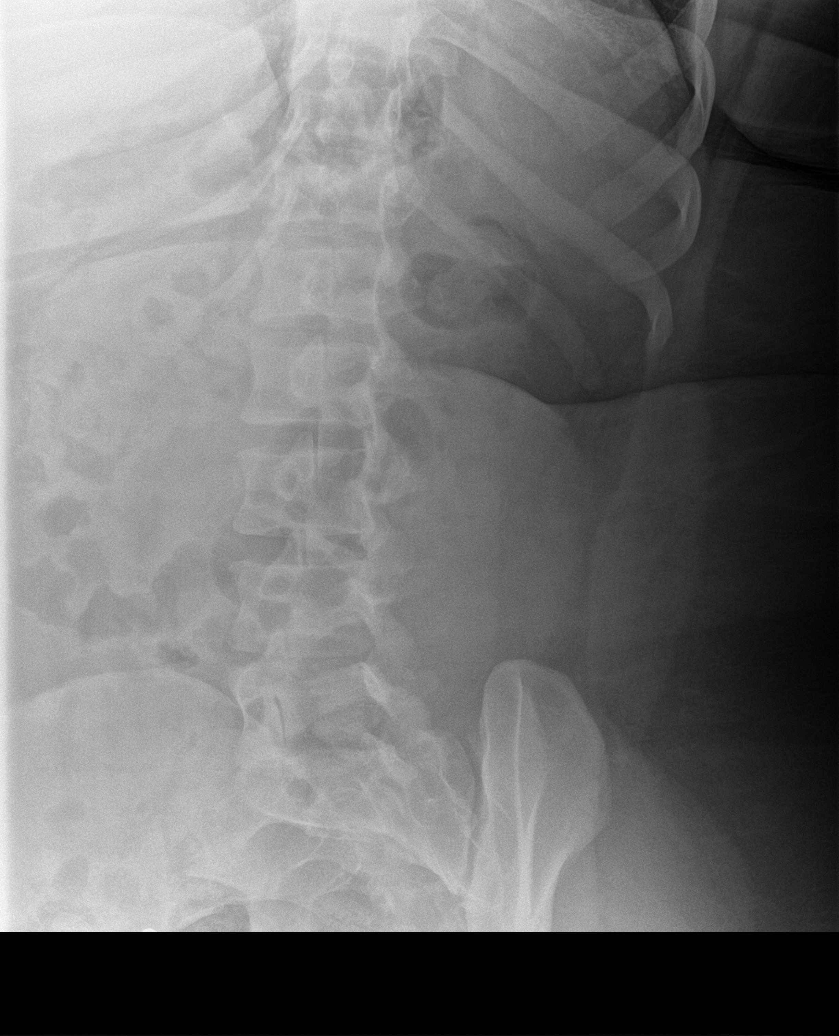

[l-spine obl (2 of 2)]
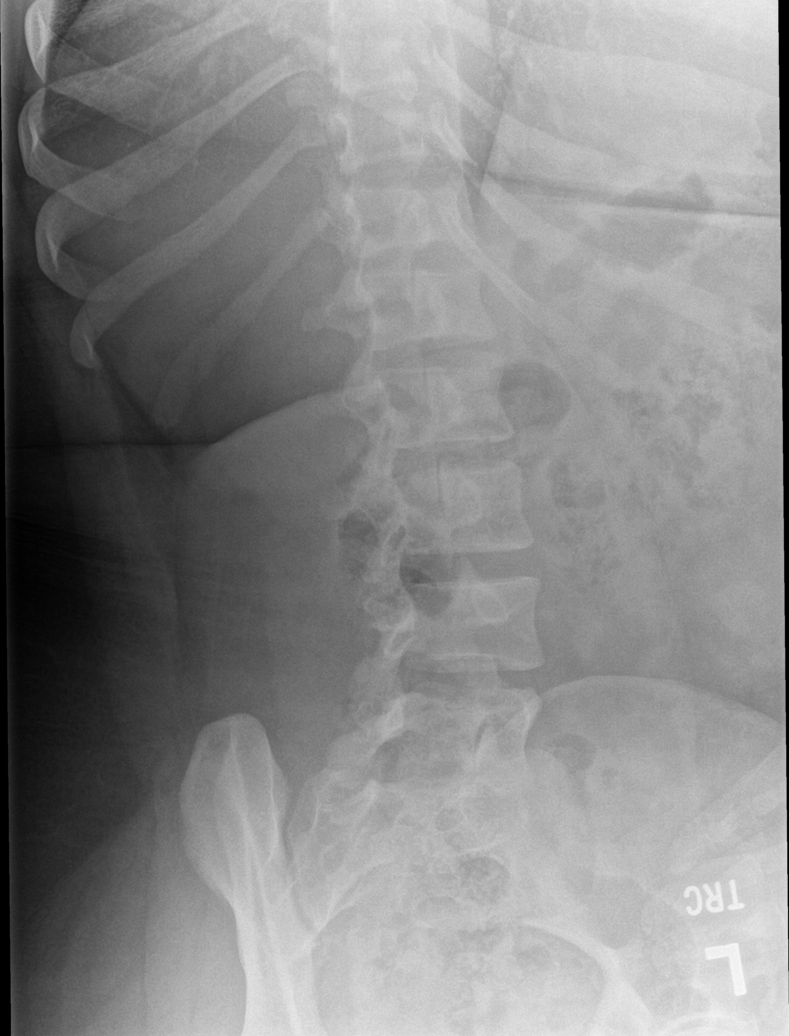

[l-spine lat]
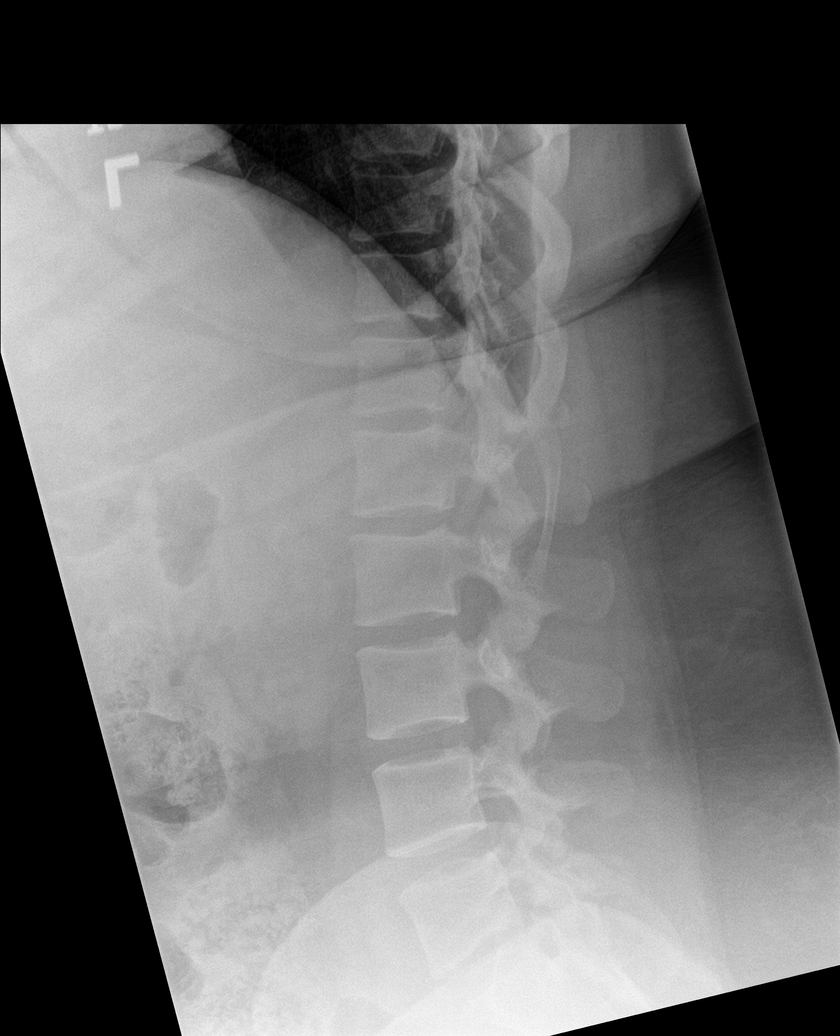

[l-spine spot]
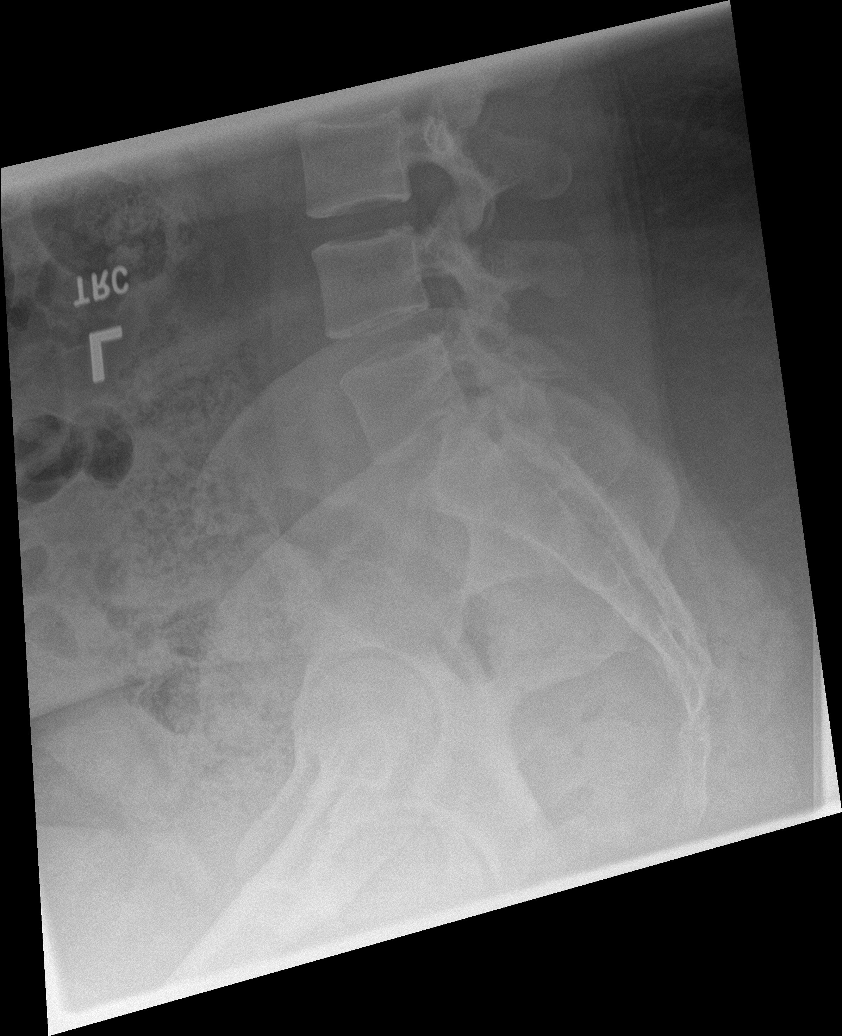

[5 of 5 positions shown; findings below may reference images not displayed]

FINDINGS: There is no evidence of lumbar spine fracture. Alignment is normal.
Intervertebral disc spaces are maintained.
IMPRESSION: Negative.

## 2023-12-29 ENCOUNTER — Encounter (HOSPITAL_BASED_OUTPATIENT_CLINIC_OR_DEPARTMENT_OTHER): Payer: Self-pay | Admitting: Emergency Medicine

## 2023-12-29 ENCOUNTER — Emergency Department (HOSPITAL_BASED_OUTPATIENT_CLINIC_OR_DEPARTMENT_OTHER): Payer: Self-pay

## 2023-12-29 ENCOUNTER — Other Ambulatory Visit: Payer: Self-pay

## 2023-12-29 ENCOUNTER — Emergency Department (HOSPITAL_BASED_OUTPATIENT_CLINIC_OR_DEPARTMENT_OTHER)
Admission: EM | Admit: 2023-12-29 | Discharge: 2023-12-29 | Disposition: A | Discharge status: Home / Self Care | Payer: Self-pay | Attending: Emergency Medicine | Admitting: Emergency Medicine

## 2023-12-29 DIAGNOSIS — O034 Incomplete spontaneous abortion without complication: Secondary | ICD-10-CM | POA: Insufficient documentation

## 2023-12-29 DIAGNOSIS — O209 Hemorrhage in early pregnancy, unspecified: Secondary | ICD-10-CM | POA: Diagnosis present

## 2023-12-29 LAB — URINALYSIS, ROUTINE W REFLEX MICROSCOPIC
Bilirubin Urine: NEGATIVE
Glucose, UA: NEGATIVE mg/dL
Ketones, ur: NEGATIVE mg/dL
Nitrite: NEGATIVE
Protein, ur: 100 mg/dL — AB
Specific Gravity, Urine: >=1.03 (ref 1.005–1.030)
pH: 5.5 (ref 5.0–8.0)

## 2023-12-29 LAB — COMPREHENSIVE METABOLIC PANEL WITH GFR
ALT: 12 U/L (ref 0–44)
AST: 17 U/L (ref 15–41)
Albumin: 3.9 g/dL (ref 3.5–5.0)
Alkaline Phosphatase: 41 U/L (ref 38–126)
Anion gap: 12 (ref 5–15)
BUN: 13 mg/dL (ref 6–20)
CO2: 22 mmol/L (ref 22–32)
Calcium: 9.2 mg/dL (ref 8.9–10.3)
Chloride: 104 mmol/L (ref 98–111)
Creatinine, Ser: 0.7 mg/dL (ref 0.44–1.00)
GFR, Estimated: >60 mL/min (ref >60)
Glucose, Bld: 138 mg/dL — ABNORMAL HIGH (ref 70–99)
Potassium: 3.8 mmol/L (ref 3.5–5.1)
Sodium: 139 mmol/L (ref 135–145)
Total Bilirubin: 0.2 mg/dL (ref 0.0–1.2)
Total Protein: 7.5 g/dL (ref 6.5–8.1)

## 2023-12-29 LAB — URINALYSIS, MICROSCOPIC (REFLEX)

## 2023-12-29 LAB — HCG, QUANTITATIVE, PREGNANCY: hCG, Beta Chain, Quant, S: 65 m[IU]/mL — ABNORMAL HIGH (ref <5)

## 2023-12-29 LAB — CBC
HCT: 38.9 % (ref 36.0–46.0)
Hemoglobin: 13.2 g/dL (ref 12.0–15.0)
MCH: 29.7 pg (ref 26.0–34.0)
MCHC: 33.9 g/dL (ref 30.0–36.0)
MCV: 87.4 fL (ref 80.0–100.0)
Platelets: 310 10*3/uL (ref 150–400)
RBC: 4.45 MIL/uL (ref 3.87–5.11)
RDW: 13.5 % (ref 11.5–15.5)
WBC: 9.9 10*3/uL (ref 4.0–10.5)
nRBC: 0 % (ref 0.0–0.2)

## 2023-12-29 NOTE — Discharge Instructions (Addendum)
 Please return if your symptoms worsen as we discussed.  If you develop fever, severe abdominal pain please return for evaluation.  It is important to follow-up with her OB/GYN to make sure that what we suspect is a miscarriage will complete and you will not need any medication any other support for it.  It is important to make sure they trend your pregnancy hormone 0 as well.

## 2023-12-29 NOTE — ED Provider Notes (Signed)
 [place] [facility] Provider Note   CSN: [id number] Arrival date & time: 12/29/23  1952     History  Chief Complaint  Patient presents with   Vaginal Bleeding    Autumn Duarte is a 28 y.o. female.  Patient here with vaginal bleeding.  She think she is about [redacted] weeks pregnant.  She denies any pain with urination.  Denies any fever or chills.  She started to have some bleeding here this morning.  She has a history of ectopic pregnancy.  However is not having any abdominal pain.  Denies any chest pain shortness of breath weakness numbness tingling.  She has had 2 prior pregnancies and then 1 ectopic pregnancy.  This is her fourth pregnancy.  The history is provided by the patient.       Home Medications Prior to Admission medications   Medication Sig Start Date End Date Taking? Authorizing Provider  acetaminophen  (TYLENOL ) 500 MG tablet Take 2 tablets (1,000 mg total) by mouth every 6 (six) hours as needed for moderate pain or mild pain. 05/03/23   Anyanwu, Ugonna A, MD  albuterol (VENTOLIN) 2 MG/5ML syrup Take 2 mg by mouth 3 (three) times daily.    [provider]  ELLA  30 MG tablet Take 1 tablet by mouth once. 06/19/23   [provider]  methocarbamol  (ROBAXIN ) 500 MG tablet Take 1 tablet (500 mg total) by mouth 2 (two) times daily. 07/20/23   Prosperi, Christian H, PA-C      Allergies    Shellfish allergy    Review of Systems   Review of Systems  Physical Exam Updated Vital Signs BP (!) 141/95   Pulse (!) 105   Temp 99.1 F (37.3 C) (Oral)   Resp 17   Ht 5\' 4"  (1.626 m)   Wt (!) 164.2 kg   LMP 11/12/2023   SpO2 98%   Breastfeeding No   BMI 62.14 kg/m  Physical Exam Vitals and nursing note reviewed.  Constitutional:      General: She is not in acute distress.    Appearance: She is well-developed. She is not ill-appearing.  HENT:     Head: Normocephalic and atraumatic.     Nose: Nose normal.      Mouth/Throat:     Mouth: Mucous membranes are moist.  Eyes:     Extraocular Movements: Extraocular movements intact.     Conjunctiva/sclera: Conjunctivae normal.     Pupils: Pupils are equal, round, and reactive to light.  Cardiovascular:     Rate and Rhythm: Normal rate and regular rhythm.     Pulses: Normal pulses.     Heart sounds: Normal heart sounds. No murmur heard. Pulmonary:     Effort: Pulmonary effort is normal. No respiratory distress.     Breath sounds: Normal breath sounds.  Abdominal:     Palpations: Abdomen is soft.     Tenderness: There is no abdominal tenderness.  Musculoskeletal:        General: No swelling.     Cervical back: Normal range of motion and neck supple.  Skin:    General: Skin is warm and dry.     Capillary Refill: Capillary refill takes less than 2 seconds.  Neurological:     Mental Status: She is alert.  Psychiatric:        Mood and Affect: Mood normal.     ED Results / Procedures / Treatments   Labs (all labs ordered are listed, but  only abnormal results are displayed) Labs Reviewed  HCG, QUANTITATIVE, PREGNANCY - Abnormal; Notable for the following components:      Result Value   hCG, Beta Chain, Quant, S 65 (*)    All other components within normal limits  COMPREHENSIVE METABOLIC PANEL WITH GFR - Abnormal; Notable for the following components:   Glucose, Bld 138 (*)    All other components within normal limits  URINALYSIS, ROUTINE W REFLEX MICROSCOPIC - Abnormal; Notable for the following components:   Color, Urine AMBER (*)    APPearance CLOUDY (*)    Hgb urine dipstick LARGE (*)    Protein, ur 100 (*)    Leukocytes,Ua SMALL (*)    All other components within normal limits  URINALYSIS, MICROSCOPIC (REFLEX) - Abnormal; Notable for the following components:   Bacteria, UA MANY (*)    All other components within normal limits  CBC    EKG None  Radiology US  OB LESS THAN 14 WEEKS WITH OB TRANSVAGINAL Result Date:  12/29/2023 CLINICAL DATA:  811914. Vaginal bleeding in the first trimester. Beta HCG is only 65. Ectopic pregnancy left adnexa diagnosed 05/02/2023. Reportedly no surgical intervention. EXAM: OBSTETRIC <14 WK US  AND TRANSVAGINAL OB US  TECHNIQUE: Both transabdominal and transvaginal ultrasound examinations were performed for complete evaluation of the gestation as well as the maternal uterus, adnexal regions, and pelvic cul-de-sac. Transvaginal technique was performed to assess early pregnancy. COMPARISON:  MRI pelvis 05/02/2023 FINDINGS: Intrauterine gestational sac:   None. Yolk sac:  Not Visualized. Embryo:  Not Visualized. Cardiac Activity: N/a. MSD: Not seen. CRL: Not seen. Subchorionic hemorrhage:  None visualized. Maternal uterus/adnexae: The the left adnexal mass noted on the MRI is no longer seen. There is a left adnexal extraovarian 1.5 cm anechoic thin walled cyst which is most likely a paraovarian cyst and could only be seen on the transabdominal imaging. The uterus is anteverted and measures 9.4 x 4.6 x 4.9 cm. No wall mass is seen. Within the lower uterine cavity, there is a solid peripherally echogenic and centrally heterogeneously hypoechoic mass, in total measuring 1.5 x 1.2 x 1.1 cm. The hypodense component appears to have a tiny sonolucent area within it which could be a collapsing yolk sac. I believe this is probably products of conception in the process of being expelled. The cervix channel is open up to 6 mm containing heterogeneous echogenic avascular material which is most likely clotted blood. The endometrial complex exclusive of the mass is 7 mm diameter. Both ovaries are unremarkable and show color flow. Right ovary is 6.4 cm3, left ovary is 4.5 cm3. No free fluid is evident. IMPRESSION: 1. 1.5 x 1.2 x 1.1 cm mass in the lower uterine cavity which is probably conception products in the process of being expelled. 2. Cervix channel open to 6 mm with clotted blood within it. 3. 1.5 cm left  paraovarian cyst, appears uncomplicated. Electronically Signed   By: Denman Fischer M.D.   On: 12/29/2023 22:13    Procedures Procedures    Medications Ordered in ED Medications - No data to display  ED Course/ Medical Decision Making/ A&P                                 Medical Decision Making Amount and/or Complexity of Data Reviewed Labs: ordered. Radiology: ordered.   Autumn Duarte is here for vaginal bleeding in the setting of positive pregnancy test.  She has no  abdominal pain.  She has noticed bleeding since this morning.  She think she is about [redacted] weeks pregnant.  Is not having any pain with urination.  No fever no chills.  Overall very well-appearing.  Abdomen is benign.  Given her history we will get a pelvic ultrasound will check pregnancy test CBC CMP urinalysis.  Urinalysis somewhat of a dirty catch.  She is not having any symptoms.  Will send urine culture.  Lab work otherwise unremarkable.  No significant leukocytosis anemia or electrolyte abnormality.  Her hCG is 65.  Pelvic ultrasound per radiology report shows what looks like an incomplete miscarriage.  Overall I think she can continue expectant management.  I made her aware that this is likely a miscarriage.  She understands to follow-up with OB.  She needs to make sure her pregnancy hormone dose is 0.  If she develops fever, severe abdominal pain or other worsening symptoms I told her to return for evaluation.  Patient discharged in good condition.  Understands return precautions.  This chart was dictated using voice recognition software.  Despite best efforts to proofread,  errors can occur which can change the documentation meaning.         Final Clinical Impression(s) / ED Diagnoses Final diagnoses:  Incomplete miscarriage    Rx / DC Orders ED Discharge Orders     None         Lowery Rue, DO 12/29/23 2318

## 2023-12-29 NOTE — ED Triage Notes (Signed)
 Pt POV steady gait- reports + preg test at home appx 1.5 week ago. No OB care yet.  Reports new bleeding this AM, now cramping, small clots.   Took tylenol  appx 0730 today.  Hx of ectopic pregnancy.   LMP 10/12/2023

## 2023-12-31 LAB — URINE CULTURE

## 2024-01-30 ENCOUNTER — Other Ambulatory Visit: Payer: Self-pay

## 2024-01-30 ENCOUNTER — Encounter (HOSPITAL_BASED_OUTPATIENT_CLINIC_OR_DEPARTMENT_OTHER): Payer: Self-pay

## 2024-01-30 ENCOUNTER — Emergency Department (HOSPITAL_BASED_OUTPATIENT_CLINIC_OR_DEPARTMENT_OTHER)
Admission: EM | Admit: 2024-01-30 | Discharge: 2024-01-30 | Disposition: A | Discharge status: Home / Self Care | Payer: Self-pay | Attending: Emergency Medicine | Admitting: Emergency Medicine

## 2024-01-30 DIAGNOSIS — Z79899 Other long term (current) drug therapy: Secondary | ICD-10-CM | POA: Insufficient documentation

## 2024-01-30 DIAGNOSIS — J45909 Unspecified asthma, uncomplicated: Secondary | ICD-10-CM | POA: Insufficient documentation

## 2024-01-30 DIAGNOSIS — I10 Essential (primary) hypertension: Secondary | ICD-10-CM | POA: Insufficient documentation

## 2024-01-30 DIAGNOSIS — Z7951 Long term (current) use of inhaled steroids: Secondary | ICD-10-CM | POA: Insufficient documentation

## 2024-01-30 DIAGNOSIS — K644 Residual hemorrhoidal skin tags: Secondary | ICD-10-CM | POA: Insufficient documentation

## 2024-01-30 DIAGNOSIS — K649 Unspecified hemorrhoids: Secondary | ICD-10-CM

## 2024-01-30 NOTE — ED Provider Notes (Signed)
 [place] [facility] Provider Note   CSN: [id number] Arrival date & time: 01/30/24  0530     Patient presents with: Hemorrhoids   Autumn Duarte is a 28 y.o. female.   The history is provided by the patient.  Rectal Bleeding Quality:  Bright red Amount:  Scant Chronicity:  New Context: defecation   Context comment:  Straining due to constipation Associated symptoms: no abdominal pain   Patient had been constipated and straining to use bathroom and then noted drops of blood on the toilet tissue with wiping and came in for evaluation.      Past Medical History:  Diagnosis Date   Asthma    last used inhaler 2 months ago   Bronchitis    Ectopic pregnancy 05/2023   Eczema    History of postpartum hemorrhage 07/18/2022   History of preterm delivery 07/08/2022   Hypertension    Obesity    Seasonal allergies      Prior to Admission medications   Medication Sig Start Date End Date Taking? Authorizing Provider  acetaminophen  (TYLENOL ) 500 MG tablet Take 2 tablets (1,000 mg total) by mouth every 6 (six) hours as needed for moderate pain or mild pain. 05/03/23   Anyanwu, Ugonna A, MD  albuterol (VENTOLIN) 2 MG/5ML syrup Take 2 mg by mouth 3 (three) times daily.    [provider]  ELLA  30 MG tablet Take 1 tablet by mouth once. 06/19/23   [provider]  methocarbamol  (ROBAXIN ) 500 MG tablet Take 1 tablet (500 mg total) by mouth 2 (two) times daily. 07/20/23   Prosperi, Christian H, PA-C    Allergies: Shellfish allergy    Review of Systems  Gastrointestinal:  Positive for anal bleeding and hematochezia. Negative for abdominal pain and blood in stool.  All other systems reviewed and are negative.   Updated Vital Signs BP (!) 164/103   Pulse (!) 116   Temp 98.1 F (36.7 C) (Oral)   LMP  (LMP Unknown) Comment: delivered 07/18/22  SpO2 97%   Physical Exam Vitals and nursing note reviewed. Exam conducted with a  chaperone present.  Constitutional:      General: She is not in acute distress.    Appearance: Normal appearance. She is well-developed.  HENT:     Head: Normocephalic and atraumatic.     Nose: Nose normal.   Eyes:     Pupils: Pupils are equal, round, and reactive to light.    Cardiovascular:     Rate and Rhythm: Normal rate and regular rhythm.     Pulses: Normal pulses.     Heart sounds: Normal heart sounds.  Pulmonary:     Effort: No respiratory distress.     Breath sounds: Normal breath sounds.  Abdominal:     General: Bowel sounds are normal. There is no distension.     Palpations: Abdomen is soft.     Tenderness: There is no abdominal tenderness. There is no guarding or rebound.  Genitourinary:    Comments: External hemorrhoids not thrombosed.    Musculoskeletal:        General: Normal range of motion.     Cervical back: Normal range of motion and neck supple.   Skin:    Findings: No erythema or rash.   Neurological:     Mental Status: She is alert and oriented to person, place, and time.     (all labs ordered are listed, but only abnormal results are displayed) Labs  Reviewed - No data to display  EKG: None  Radiology: No results found.   Procedures   Medications Ordered in the ED - No data to display                                  Medical Decision Making Blood on toilet tissue post constipation/straining   Amount and/or Complexity of Data Reviewed External Data Reviewed: notes.    Details: Previous notes reviewed   Risk Risk Details: Patient with visible hemorrhoids as the source of blood on tissue.  Have advised Preparation H, miralax to soften stools and produce regular BM and using baby wipes to avoid irritation to the area.  Stable for discharge.  Strict returns      Final diagnoses:  Hemorrhoids, unspecified hemorrhoid type   The patient is nontoxic-appearing on exam and vital signs are within normal limits.  I have reviewed the  triage vital signs and the nursing notes. Pertinent labs & imaging results that were available during my care of the patient were reviewed by me and considered in my medical decision making (see chart for details). After history, exam, and medical workup I feel the patient has been appropriately medically screened and is safe for discharge home. Pertinent diagnoses were discussed with the patient. Patient was given return precautions. ED Discharge Orders     None          Delrick Dehart, MD 01/30/24 [phone]

## 2024-01-30 NOTE — Discharge Instructions (Addendum)
 Use preparation H cream to shrink hemorrhoids.  Use baby wipes when you wipe to avoid irritation.  Use Miralax daily to prevent constipation this will prevent irritation and pressure to the bottom

## 2024-01-30 NOTE — ED Triage Notes (Signed)
 Pt. Reports being constipated and during a bowel movement when wiping pt. Saw blood.

## 2024-03-12 ENCOUNTER — Encounter: Payer: Self-pay | Admitting: Obstetrics & Gynecology

## 2024-03-13 ENCOUNTER — Telehealth: Payer: Self-pay

## 2024-03-13 NOTE — Telephone Encounter (Signed)
 Called Pt to go over concern of her not having a F/U appointment after her SAB in May, advised request sent to front desk to schedule , she had also mentioned still has not had a period. Advised Pt to do a Pregnancy test & we'll address everything at her visit. Pt verbalized understanding.

## 2024-03-19 NOTE — Progress Notes (Unsigned)
 GYNECOLOGY OFFICE VISIT NOTE  History:  Autumn Duarte is a 28 y.o. [id number] here today for likely SAB in May. She had an HCG which was 65 at that time and US  showed possible sac passing in the ED. She then had trouble getting a follow up appointment. Since that miscarriage she has not had her period.   She has had unprotected intercourse.   She notes some ongoing nausea.   She does have a history of ectopic pregnancy.   Past Medical History:  Diagnosis Date   Asthma    last used inhaler 2 months ago   Bronchitis    Ectopic pregnancy 05/2023   Eczema    History of postpartum hemorrhage 07/18/2022   History of preterm delivery 07/08/2022   Hypertension    Obesity    Seasonal allergies     Past Surgical History:  Procedure Laterality Date   WISDOM TOOTH EXTRACTION      The following portions of the patient's history were reviewed and updated as appropriate: allergies, current medications, past family history, past medical history, past social history, past surgical history and problem list.   Health Maintenance:   Diagnosis  Date Value Ref Range Status  12/20/2021   Final   - Negative for intraepithelial lesion or malignancy (NILM)     Review of Systems:  Pertinent items noted in HPI and remainder of comprehensive ROS otherwise negative.  Physical Exam:  BP 134/79   Pulse 91   Wt (!) 378 lb (171.5 kg)   LMP 10/17/2023 (Exact Date) Comment: delivered 07/18/22  Breastfeeding No   BMI 64.88 kg/m  CONSTITUTIONAL: Well-developed, well-nourished female in no acute distress.  HEENT:  Normocephalic, atraumatic. External right and left ear normal. No scleral icterus.  NECK: Normal range of motion, supple, no masses noted on observation SKIN: No rash noted. Not diaphoretic. No erythema. No pallor. MUSCULOSKELETAL: Normal range of motion. No edema noted. NEUROLOGIC: Alert and oriented to person, place, and time. Normal muscle tone coordination. No cranial nerve deficit  noted. PSYCHIATRIC: Normal mood and affect. Normal behavior. Normal judgment and thought content.  PELVIC: Deferred  Labs and Imaging Results for orders placed or performed in visit on 03/20/24 (from the past week)  Pregnancy, urine POC   Collection Time: 03/20/24  4:05 PM  Result Value Ref Range   Preg Test, Ur POSITIVE (A) NEGATIVE   No results found.  Assessment and Plan:  Autumn Duarte was seen today for follow-up.  Diagnoses and all orders for this visit:  Miscarriage Likely completed SAB and new pregnancy since then.  -     POCT urine pregnancy  Positive pregnancy test Due to h/o ectopic will check HCG and TVUS asap.  Discussed pregnancy plan - she does not think she would keep the pregnancy at this time but will depend on how far along. Will give her information for PP that does procedures.  Discussed birth control options for after. She will prefer IUD and would prefer to come back here for it.  -     Beta hCG quant (ref lab) -     US  OB LESS THAN 14 WEEKS WITH OB TRANSVAGINAL; Future  Other orders -     Pregnancy, urine POC  No orders of the defined types were placed in this encounter.    Routine preventative health maintenance measures emphasized. Please refer to After Visit Summary for other counseling recommendations.   Return if symptoms worsen or fail to improve.  Vina Solian, MD,  FACOG Obstetrician & Gynecologist, Biochemist, clinical for [employer], [facility]

## 2024-03-20 ENCOUNTER — Other Ambulatory Visit: Payer: Self-pay

## 2024-03-20 ENCOUNTER — Ambulatory Visit: Admitting: Obstetrics and Gynecology

## 2024-03-20 ENCOUNTER — Encounter: Payer: Self-pay | Admitting: Obstetrics and Gynecology

## 2024-03-20 VITALS — BP 134/79 | HR 91 | Wt 378.0 lb

## 2024-03-20 DIAGNOSIS — Z3201 Encounter for pregnancy test, result positive: Secondary | ICD-10-CM | POA: Diagnosis not present

## 2024-03-20 DIAGNOSIS — Z1332 Encounter for screening for maternal depression: Secondary | ICD-10-CM | POA: Diagnosis not present

## 2024-03-20 DIAGNOSIS — O039 Complete or unspecified spontaneous abortion without complication: Secondary | ICD-10-CM

## 2024-03-20 DIAGNOSIS — Z3A11 11 weeks gestation of pregnancy: Secondary | ICD-10-CM

## 2024-03-20 LAB — POCT PREGNANCY, URINE: Preg Test, Ur: POSITIVE — AB

## 2024-03-20 NOTE — Patient Instructions (Addendum)
 Autumn Duarte

## 2024-03-20 NOTE — Progress Notes (Signed)
 Ultrasound scheduled for 03/21/24 at 11:30 per Dr. Cleatus.  Pt informed of time and confirmed appointment verbally.    Waddell, RN

## 2024-03-21 ENCOUNTER — Ambulatory Visit: Payer: Self-pay | Admitting: Obstetrics and Gynecology

## 2024-03-21 ENCOUNTER — Ambulatory Visit (INDEPENDENT_AMBULATORY_CARE_PROVIDER_SITE_OTHER)

## 2024-03-21 ENCOUNTER — Other Ambulatory Visit: Payer: Self-pay | Admitting: Obstetrics and Gynecology

## 2024-03-21 DIAGNOSIS — Z3491 Encounter for supervision of normal pregnancy, unspecified, first trimester: Secondary | ICD-10-CM

## 2024-03-21 DIAGNOSIS — O039 Complete or unspecified spontaneous abortion without complication: Secondary | ICD-10-CM

## 2024-03-21 DIAGNOSIS — Z3201 Encounter for pregnancy test, result positive: Secondary | ICD-10-CM

## 2024-03-21 DIAGNOSIS — Z3A11 11 weeks gestation of pregnancy: Secondary | ICD-10-CM | POA: Diagnosis not present

## 2024-03-21 LAB — BETA HCG QUANT (REF LAB): hCG Quant: 50827 m[IU]/mL

## 2024-03-23 ENCOUNTER — Ambulatory Visit: Payer: Self-pay | Admitting: Obstetrics and Gynecology

## 2024-04-03 ENCOUNTER — Telehealth (INDEPENDENT_AMBULATORY_CARE_PROVIDER_SITE_OTHER)

## 2024-04-03 DIAGNOSIS — J4520 Mild intermittent asthma, uncomplicated: Secondary | ICD-10-CM

## 2024-04-03 DIAGNOSIS — Z3A13 13 weeks gestation of pregnancy: Secondary | ICD-10-CM

## 2024-04-03 DIAGNOSIS — Z3480 Encounter for supervision of other normal pregnancy, unspecified trimester: Secondary | ICD-10-CM | POA: Insufficient documentation

## 2024-04-03 DIAGNOSIS — Z3481 Encounter for supervision of other normal pregnancy, first trimester: Secondary | ICD-10-CM

## 2024-04-03 MED ORDER — ALBUTEROL SULFATE HFA 108 (90 BASE) MCG/ACT IN AERS: 2.0000 | INHALATION_SPRAY | Freq: Four times a day (QID) | RESPIRATORY_TRACT | 2 refills | Status: DC | PRN

## 2024-04-03 MED ORDER — BLOOD PRESSURE KIT DEVI
1.0000 | 0 refills | Status: AC | PRN
Start: 2024-04-03 — End: ?

## 2024-04-03 MED ORDER — GOJJI WEIGHT SCALE MISC: 1.0000 | 0 refills | Status: AC | PRN

## 2024-04-03 NOTE — Progress Notes (Signed)
 New OB Intake  I connected with Autumn Duarte  on 04/03/24 at  2:15 PM EDT by MyChart Video Visit and verified that I am speaking with the correct person using two identifiers. Nurse is located at [facility] and pt is located at home.  I discussed the limitations, risks, security and privacy concerns of performing an evaluation and management service by telephone and the availability of in person appointments. I also discussed with the patient that there may be a patient responsible charge related to this service. The patient expressed understanding and agreed to proceed.  I explained I am completing New OB Intake today. We discussed EDD of 10/08/2024 based on US  at 11 weeks. Pt is [id number]. I reviewed her allergies, medications and Medical/Surgical/OB history.    Patient Active Problem List   Diagnosis Date Noted   Supervision of other normal pregnancy, antepartum 04/03/2024   Ruptured left tubal ectopic pregnancy 05/01/2023   History of preterm delivery, currently pregnant 07/08/2022   Mild intermittent asthma without complication 12/21/2021   Adjustment disorder with mixed anxiety and depressed mood 01/29/2020   Prediabetes 11/18/2019   History of Gestational hypertension 12/03/2018   BMI 50.0-59.9, adult (HCC) 06/11/2018   Moderate major depression (HCC) 09/29/2016   PROBLEMS WITH HEARING 11/18/2008   ECZEMA 04/16/2007     Concerns addressed today  Delivery Plans Plans to deliver at [facility]. Discussed the nature of our practice with multiple providers including residents and students as well as female and female providers. Due to the size of the practice, the delivering provider may not be the same as those providing prenatal care.   Patient is not interested in water birth.  MyChart/Babyscripts MyChart access verified. I explained pt will have some visits in office and some virtually. Babyscripts instructions given and order placed. Patient verifies receipt of registration text/e-mail.  Account successfully created and app downloaded. If patient is a candidate for Optimized scheduling, add to sticky note.   Blood Pressure Cuff/Weight Scale Blood pressure cuff ordered for patient to pick-up from [employer]. Explained after first prenatal appt pt will check weekly and document in Babyscripts. Patient does not have weight scale; order sent to [facility], patient may track weight weekly in Babyscripts.  Anatomy US  Sent staff message to MFM Clerical pool to schedule anatomy US .  Is patient a CenteringPregnancy candidate?  Declined Declined due to Schedule  Is patient a Mom+Baby Combined Care candidate?  Not a candidate   If accepted, confirm patient does not intend to move from the area for at least 12 months, then notify Mom+Baby staff  Is patient a candidate for Babyscripts Optimization? No, due to hx of gestational hypertension, and hx of preterm delivery.   First visit review I reviewed new OB appt with patient. Explained pt will be seen by Autumn LITTIE Daring FNP at first visit. Discussed Autumn Duarte genetic screening with patient. YES Panorama and Horizon 02/11/22 negative.. Routine prenatal labs is not   Last Pap Diagnosis  Date Value Ref Range Status  12/20/2021   Final   - Negative for intraepithelial lesion or malignancy (NILM)    Autumn Duarte, CMA 04/03/2024  2:43 PM

## 2024-04-12 ENCOUNTER — Other Ambulatory Visit: Payer: Self-pay

## 2024-04-12 ENCOUNTER — Encounter: Payer: Self-pay | Admitting: Obstetrics and Gynecology

## 2024-04-12 ENCOUNTER — Other Ambulatory Visit (HOSPITAL_COMMUNITY)
Admission: RE | Admit: 2024-04-12 | Discharge: 2024-04-12 | Disposition: A | Discharge status: Home / Self Care | Source: Ambulatory Visit | Attending: Obstetrics and Gynecology | Admitting: Obstetrics and Gynecology

## 2024-04-12 ENCOUNTER — Ambulatory Visit (INDEPENDENT_AMBULATORY_CARE_PROVIDER_SITE_OTHER): Admitting: Obstetrics and Gynecology

## 2024-04-12 VITALS — BP 141/87 | HR 98 | Wt 385.0 lb

## 2024-04-12 DIAGNOSIS — Z3A14 14 weeks gestation of pregnancy: Secondary | ICD-10-CM | POA: Diagnosis not present

## 2024-04-12 DIAGNOSIS — O09892 Supervision of other high risk pregnancies, second trimester: Secondary | ICD-10-CM

## 2024-04-12 DIAGNOSIS — Z1332 Encounter for screening for maternal depression: Secondary | ICD-10-CM | POA: Diagnosis not present

## 2024-04-12 DIAGNOSIS — Z8759 Personal history of other complications of pregnancy, childbirth and the puerperium: Secondary | ICD-10-CM | POA: Diagnosis not present

## 2024-04-12 DIAGNOSIS — O09899 Supervision of other high risk pregnancies, unspecified trimester: Secondary | ICD-10-CM

## 2024-04-12 DIAGNOSIS — Z3480 Encounter for supervision of other normal pregnancy, unspecified trimester: Secondary | ICD-10-CM | POA: Insufficient documentation

## 2024-04-12 DIAGNOSIS — O10912 Unspecified pre-existing hypertension complicating pregnancy, second trimester: Secondary | ICD-10-CM | POA: Diagnosis not present

## 2024-04-12 DIAGNOSIS — O10919 Unspecified pre-existing hypertension complicating pregnancy, unspecified trimester: Secondary | ICD-10-CM

## 2024-04-12 MED ORDER — ASPIRIN 81 MG PO TBEC: 162.0000 mg | DELAYED_RELEASE_TABLET | Freq: Every day | ORAL | 2 refills | Status: AC

## 2024-04-12 MED ORDER — PREPLUS 27-1 MG PO TABS: 1.0000 | ORAL_TABLET | Freq: Every day | ORAL | 13 refills | Status: AC

## 2024-04-12 MED ORDER — PRENATAL PLUS 27-1 MG PO TABS: 1.0000 | ORAL_TABLET | Freq: Every day | ORAL | 11 refills | Status: AC

## 2024-04-12 NOTE — Progress Notes (Signed)
 INITIAL PRENATAL VISIT  Subjective:   Autumn Duarte is being seen today for her first obstetrical visit.  She is at [redacted]w[redacted]d gestation by ultrasound Her obstetrical history is significant for hx twin pregnancyx2, hx PPROM and preterm delivery, hx of gestational htn . Relationship with FOB: significant other, living together. Patient does intend to breast feed. Pregnancy history fully reviewed.  Patient reports no complaints.  Indications for ASA therapy (per uptodate) One of the following: Previous pregnancy with preeclampsia, especially early onset and with an adverse outcome No Multifetal gestation No Chronic hypertension Yes Type 1 or 2 diabetes mellitus No Chronic kidney disease No Autoimmune disease (antiphospholipid syndrome, systemic lupus erythematosus) No   Objective:    Obstetric History OB History  Gravida Para Term Preterm AB Living  5 2 1 1 2 4   SAB IAB Ectopic Multiple Live Births  1  1 2 4     # Outcome Date GA Lbr Len/2nd Weight Sex Type Anes PTL Lv  5 Current           4 SAB 11/2023          3 Ectopic 2024          2A Preterm 07/18/22 [redacted]w[redacted]d / 00:09 5 lb 10 oz (2.55 kg) M Vag-Spont None  LIV     Birth Comments: apneic and bradycardic requiring PPV- see note.  2B Preterm 07/18/22 [redacted]w[redacted]d / 00:45 5 lb 6.8 oz (2.46 kg) F Vag-Spont None  LIV  1A Term 12/04/18 [redacted]w[redacted]d 11:32 / 01:20 6 lb 1.5 oz (2.764 kg) F Vag-Spont EPI  LIV     Birth Comments: WDL  1B Term 12/04/18 [redacted]w[redacted]d 11:32 / 01:40 5 lb 10.1 oz (2.554 kg) F Vag-Spont EPI  LIV     Birth Comments: WDL    Past Medical History:  Diagnosis Date   Asthma    last used inhaler 2 months ago   Bronchitis    Ectopic pregnancy 05/2023   Eczema    History of postpartum hemorrhage 07/18/2022   History of preterm delivery 07/08/2022   Hypertension    Obesity    Seasonal allergies     Past Surgical History:  Procedure Laterality Date   WISDOM TOOTH EXTRACTION      Current Outpatient Medications on File Prior to  Visit  Medication Sig Dispense Refill   albuterol  (VENTOLIN  HFA) 108 (90 Base) MCG/ACT inhaler Inhale 2 puffs into the lungs every 6 (six) hours as needed for wheezing or shortness of breath. 1 each 2   fluticasone-salmeterol (ADVAIR) 100-50 MCG/ACT AEPB Inhale 1 puff into the lungs 2 (two) times daily as needed (as needed).     sertraline  (ZOLOFT ) 50 MG tablet Take 2 tablets by mouth daily.     Blood Pressure Monitoring (BLOOD PRESSURE KIT) DEVI 1 Device by Does not apply route as needed. 1 each 0   Misc. Devices (GOJJI WEIGHT SCALE) MISC 1 Device by Does not apply route as needed. 1 each 0   No current facility-administered medications on file prior to visit.    Allergies  Allergen Reactions   Shellfish Allergy Itching, Shortness Of Breath and Hives    Social History:  reports that she quit smoking about 6 years ago. Her smoking use included cigarettes. She has never used smokeless tobacco. She reports that she does not currently use alcohol. She reports that she does not use drugs.  Family History  Problem Relation Age of Onset   Arthritis Mother    Hepatitis  Mother    Thyroid disease Mother    Diabetes Father    Heart disease Father    Hypertension Father    Breast cancer Maternal Aunt    Breast cancer Maternal Grandmother     The following portions of the patient's history were reviewed and updated as appropriate: allergies, current medications, past family history, past medical history, past social history, past surgical history and problem list.  Review of Systems Review of Systems  All other systems reviewed and are negative.   Physical Exam:  BP (!) 141/87   Pulse 98   Wt (!) 385 lb (174.6 kg)   LMP  (Exact Date) Comment: delivered 07/18/22  BMI 66.09 kg/m  CONSTITUTIONAL: Well-developed, well-nourished female in no acute distress.  HENT:  Normocephalic, atraumatic.   EYES: Conjunctivae normal. NECK: Normal range of motion SKIN: Skin is warm and  dry. MUSCULOSKELETAL: Normal range of motion NEUROLOGIC: Alert and oriented  PSYCHIATRIC: Normal mood and affect. Normal behavior.  CARDIOVASCULAR: Normal heart rate noted RESPIRATORY: normal effort ABDOMEN: Soft PELVIC:deferred    Assessment:    Pregnancy: [id number]  1. Supervision of other normal pregnancy, antepartum (Primary) FHR detected bedside u/s and showed to patient  - Culture, OB Urine - GC/Chlamydia probe amp ([place])not at [facility] - CBC/D/Plt+RPR+Rh+ABO+RubIgG... - Hemoglobin A1c - Protein / creatinine ratio, urine - TSH - Comprehensive metabolic panel with GFR - PANORAMA PRENATAL TEST - prenatal vitamin w/FE, FA (PRENATAL 1 + 1) 27-1 MG TABS tablet; Take 1 tablet by mouth daily at 12 noon.  Dispense: 30 tablet; Refill: 11 - aspirin  EC 81 MG tablet; Take 2 tablets (162 mg total) by mouth daily. Start taking when you are [redacted] weeks pregnant for rest of pregnancy for prevention of preeclampsia  Dispense: 300 tablet; Refill: 2 - Prenatal Vit-Fe Fumarate-FA (PREPLUS) 27-1 MG TABS; Take 1 tablet by mouth daily.  Dispense: 30 tablet; Refill: 13 2. History of preterm delivery, currently pregnant Twins @ 33.6 d/t PPROM ( second set of twins)  3. Chronic hypertension affecting pregnancy 4. History of gestational hypertension Discussed recommendation for ASA 162mg  Picked up bp cuff, encouraged to monitor at home  Has not been told chtn, upon review of chart, multiple visits with elevated bp  Checking labs today  5. [redacted] weeks gestation of pregnancy  6. History of postpartum hemorrhage Reports pph after both deliveries, she received 2u PRBC     Plan:     Initial labs drawn. Prenatal vitamins. Problem list reviewed and updated. Reviewed in detail the nature of the practice with collaborative care between  Genetic screening discussed: NIPS/First trimester screen/Quad/AFP ordered. Role of ultrasound in pregnancy discussed; Anatomy US : ordered. Discussed clinic routines,  schedule of care and testing, genetic screening options, involvement of students and residents under the direct supervision of APPs and doctors and presence of female providers. Pt verbalized understanding.   Delores Nidia CROME, FNP

## 2024-04-13 LAB — COMPREHENSIVE METABOLIC PANEL WITH GFR
ALT: 9 IU/L (ref 0–32)
AST: 13 IU/L (ref 0–40)
Albumin: 3.7 g/dL — ABNORMAL LOW (ref 4.0–5.0)
Alkaline Phosphatase: 30 IU/L — ABNORMAL LOW (ref 44–121)
BUN/Creatinine Ratio: 10 (ref 9–23)
BUN: 7 mg/dL (ref 6–20)
Bilirubin Total: 0.2 mg/dL (ref 0.0–1.2)
CO2: 20 mmol/L (ref 20–29)
Calcium: 9.3 mg/dL (ref 8.7–10.2)
Chloride: 102 mmol/L (ref 96–106)
Creatinine, Ser: 0.69 mg/dL (ref 0.57–1.00)
Globulin, Total: 2.9 g/dL (ref 1.5–4.5)
Glucose: 114 mg/dL — ABNORMAL HIGH (ref 70–99)
Potassium: 4.3 mmol/L (ref 3.5–5.2)
Sodium: 138 mmol/L (ref 134–144)
Total Protein: 6.6 g/dL (ref 6.0–8.5)
eGFR: 123 mL/min/1.73 (ref >59)

## 2024-04-13 LAB — CBC/D/PLT+RPR+RH+ABO+RUBIGG...
Antibody Screen: NEGATIVE
Basophils Absolute: 0 x10E3/uL (ref 0.0–0.2)
Basos: 0 %
EOS (ABSOLUTE): 0 x10E3/uL (ref 0.0–0.4)
Eos: 0 %
HCV Ab: NONREACTIVE
HIV Screen 4th Generation wRfx: NONREACTIVE
Hematocrit: 37.9 % (ref 34.0–46.6)
Hemoglobin: 12.5 g/dL (ref 11.1–15.9)
Hepatitis B Surface Ag: NEGATIVE
Immature Grans (Abs): 0 x10E3/uL (ref 0.0–0.1)
Immature Granulocytes: 0 %
Lymphocytes Absolute: 2.5 x10E3/uL (ref 0.7–3.1)
Lymphs: 26 %
MCH: 30 pg (ref 26.6–33.0)
MCHC: 33 g/dL (ref 31.5–35.7)
MCV: 91 fL (ref 79–97)
Monocytes Absolute: 0.4 x10E3/uL (ref 0.1–0.9)
Monocytes: 4 %
Neutrophils Absolute: 6.7 x10E3/uL (ref 1.4–7.0)
Neutrophils: 70 %
Platelets: 279 x10E3/uL (ref 150–450)
RBC: 4.17 x10E6/uL (ref 3.77–5.28)
RDW: 13.1 % (ref 11.7–15.4)
RPR Ser Ql: NONREACTIVE
Rh Factor: POSITIVE
Rubella Antibodies, IGG: 1.55 {index} (ref >0.99)
WBC: 9.6 x10E3/uL (ref 3.4–10.8)

## 2024-04-13 LAB — HCV INTERPRETATION

## 2024-04-13 LAB — HEMOGLOBIN A1C
Est. average glucose Bld gHb Est-mCnc: 117 mg/dL
Hgb A1c MFr Bld: 5.7 % — ABNORMAL HIGH (ref 4.8–5.6)

## 2024-04-13 LAB — TSH: TSH: 2.1 u[IU]/mL (ref 0.450–4.500)

## 2024-04-14 ENCOUNTER — Ambulatory Visit: Payer: Self-pay | Admitting: Obstetrics and Gynecology

## 2024-04-14 DIAGNOSIS — Z3480 Encounter for supervision of other normal pregnancy, unspecified trimester: Secondary | ICD-10-CM

## 2024-04-14 DIAGNOSIS — O99810 Abnormal glucose complicating pregnancy: Secondary | ICD-10-CM | POA: Insufficient documentation

## 2024-04-15 ENCOUNTER — Other Ambulatory Visit: Payer: Self-pay | Admitting: *Deleted

## 2024-04-15 DIAGNOSIS — O99810 Abnormal glucose complicating pregnancy: Secondary | ICD-10-CM

## 2024-04-15 LAB — GC/CHLAMYDIA PROBE AMP (~~LOC~~) NOT AT ARMC
Chlamydia: NEGATIVE
Comment: NEGATIVE
Comment: NORMAL
Neisseria Gonorrhea: NEGATIVE

## 2024-04-17 ENCOUNTER — Telehealth: Payer: Self-pay

## 2024-04-17 ENCOUNTER — Encounter: Payer: Self-pay | Admitting: Obstetrics & Gynecology

## 2024-04-17 NOTE — Telephone Encounter (Signed)
 Pt contacted so that we can schedule BP check tomorrow along with her 2hr appt.  Pt agreed to 0920 on 04/18/24.     Rushie Brazel,RN  04/17/24

## 2024-04-18 ENCOUNTER — Ambulatory Visit (INDEPENDENT_AMBULATORY_CARE_PROVIDER_SITE_OTHER)

## 2024-04-18 ENCOUNTER — Other Ambulatory Visit: Payer: Self-pay

## 2024-04-18 ENCOUNTER — Other Ambulatory Visit

## 2024-04-18 VITALS — BP 129/84 | HR 98 | Ht 64.0 in | Wt 382.0 lb

## 2024-04-18 DIAGNOSIS — O10912 Unspecified pre-existing hypertension complicating pregnancy, second trimester: Secondary | ICD-10-CM

## 2024-04-18 DIAGNOSIS — O10919 Unspecified pre-existing hypertension complicating pregnancy, unspecified trimester: Secondary | ICD-10-CM

## 2024-04-18 DIAGNOSIS — Z3A15 15 weeks gestation of pregnancy: Secondary | ICD-10-CM

## 2024-04-18 DIAGNOSIS — O99810 Abnormal glucose complicating pregnancy: Secondary | ICD-10-CM

## 2024-04-18 DIAGNOSIS — Z3480 Encounter for supervision of other normal pregnancy, unspecified trimester: Secondary | ICD-10-CM

## 2024-04-18 LAB — PANORAMA PRENATAL TEST FULL PANEL:PANORAMA TEST PLUS 5 ADDITIONAL MICRODELETIONS: FETAL FRACTION: 5.6

## 2024-04-18 NOTE — Progress Notes (Signed)
 Blood Pressure Check Visit  Autumn Duarte is here for blood pressure check following elevated blood pressures noted on Babyscripts App on 04/17/24. BP has a history of cHTN. BP today is 129/84. Patient denies any dizziness, blurred vision, headache, shortness of breath or peripheral edema.  Patient denies any other needs at this time. Advised patient to continue to record blood pressures on Babyscripts app and notify our office of any elevated BP and/or symptoms related to high blood pressure. Educated patient how to take most accurate blood pressures at home. Patient voiced understanding.   Patient has a prenatal appointment on 05/07/24 at 2:35 PM. Patient confirmed scheduled appointment.   Rosaline Pendleton, RN 04/18/2024  8:52 AM

## 2024-04-19 ENCOUNTER — Other Ambulatory Visit: Payer: Self-pay | Admitting: Obstetrics and Gynecology

## 2024-04-19 ENCOUNTER — Ambulatory Visit: Admitting: Obstetrics and Gynecology

## 2024-04-19 ENCOUNTER — Other Ambulatory Visit: Payer: Self-pay

## 2024-04-19 DIAGNOSIS — O24419 Gestational diabetes mellitus in pregnancy, unspecified control: Secondary | ICD-10-CM

## 2024-04-19 LAB — GLUCOSE TOLERANCE, 2 HOURS W/ 1HR
Glucose, 1 hour: 151 mg/dL (ref 70–179)
Glucose, 2 hour: 95 mg/dL (ref 70–152)
Glucose, Fasting: 92 mg/dL — ABNORMAL HIGH (ref 70–91)

## 2024-04-19 MED ORDER — ACCU-CHEK GUIDE TEST VI STRP: ORAL_STRIP | 12 refills | Status: DC

## 2024-04-19 MED ORDER — ACCU-CHEK GUIDE W/DEVICE KIT: 1.0000 | PACK | Freq: Four times a day (QID) | 0 refills | Status: AC

## 2024-04-19 MED ORDER — ACCU-CHEK SOFTCLIX LANCETS MISC: 11 refills | Status: AC

## 2024-04-19 MED ORDER — ACCU-CHEK GUIDE TEST VI STRP
ORAL_STRIP | 12 refills | Status: AC
Start: 2024-04-19 — End: ?

## 2024-04-19 NOTE — Progress Notes (Unsigned)
 Patient was seen for Gestational Diabetes on 04/25/2024  Start time 0926 and End time 1015   Estimated due date: 10/08/2024; [redacted]w[redacted]d   Clinical: Medications:  Current Outpatient Medications:    Accu-Chek Softclix Lancets lancets, Use as instructed; check blood glucose 4 times daily, Disp: 100 each, Rfl: 11   albuterol  (VENTOLIN  HFA) 108 (90 Base) MCG/ACT inhaler, Inhale 2 puffs into the lungs every 6 (six) hours as needed for wheezing or shortness of breath., Disp: 1 each, Rfl: 2   aspirin  EC 81 MG tablet, Take 2 tablets (162 mg total) by mouth daily. Start taking when you are [redacted] weeks pregnant for rest of pregnancy for prevention of preeclampsia, Disp: 300 tablet, Rfl: 2   Blood Glucose Monitoring Suppl (ACCU-CHEK GUIDE) w/Device KIT, 1 kit by Does not apply route in the morning, at noon, in the evening, and at bedtime., Disp: 1 kit, Rfl: 0   Blood Pressure Monitoring (BLOOD PRESSURE KIT) DEVI, 1 Device by Does not apply route as needed., Disp: 1 each, Rfl: 0   glucose blood (ACCU-CHEK GUIDE TEST) test strip, Use 4 times a day to include fasting before breakfast, breakfast, lunch, and dinner, Disp: 100 strip, Rfl: 12   Prenatal Vit-Fe Fumarate-FA (PREPLUS) 27-1 MG TABS, Take 1 tablet by mouth daily., Disp: 30 tablet, Rfl: 13   sertraline  (ZOLOFT ) 50 MG tablet, Take 2 tablets by mouth daily., Disp: , Rfl:    fluticasone-salmeterol (ADVAIR) 100-50 MCG/ACT AEPB, Inhale 1 puff into the lungs 2 (two) times daily as needed (as needed). (Patient not taking: Reported on 04/25/2024), Disp: , Rfl:    Misc. Devices (GOJJI WEIGHT SCALE) MISC, 1 Device by Does not apply route as needed., Disp: 1 each, Rfl: 0   prenatal vitamin w/FE, FA (PRENATAL 1 + 1) 27-1 MG TABS tablet, Take 1 tablet by mouth daily at 12 noon. (Patient not taking: Reported on 04/18/2024), Disp: 30 tablet, Rfl: 11  Medical History:  Past Medical History:  Diagnosis Date   Asthma    last used inhaler 2 months ago   Bronchitis    Ectopic  pregnancy 05/2023   Eczema    History of postpartum hemorrhage 07/18/2022   History of preterm delivery 07/08/2022   Hypertension    Obesity    Seasonal allergies     Labs: OGTT fasting 92, 1 hour 151, 2 hour 95 on 04/18/2024 Lab Results  Component Value Date   HGBA1C 5.7 (H) 04/12/2024   Dietary and Lifestyle History: Pt presents today alone with her meter. Pt denies previous GDM. Pt reports she does the majority of the cooking and shopping for family of six. Pt reports she works full time on third shift from 2 am-10:30 am mostly sitting. Pt performed 2 hour post prandial in office today with a value of 120 mg/dL. Pt reports she started walking daily for 15 minutes upon diagnosis of GDM. Pt states a willingness to avoid juice intake moving forward for optimal blood sugar control. Pt reports she avoids beef and pork for personal preferences. Pt states she can avoid her food allergy of shellfish successfully. Pt reports she falls asleep at night before she is able to check her blood sugar and states testing an hour after meals could prove to be a better schedule. Pt reports her current schedule is to wake up near 12:30p-2p daily with sleep intervals of 2-4 hour 2-3 times daily. All Pt's questions were answered during this encounter.   Physical Activity: walking 15 minutes daily  Stress:  5 out of 10 / self care includes: sleep Sleep: interrupted; on average hours 2-4 hours and takes naps 2-3 hours *2-3 daily    24 hr Recall:  First Meal:  ~1-2 pm: [place], 2 slices of whole wheat, mayo, cheese, lettuce, water, apple juice (127 mg/dL, reported as 2 hour post prandial per Pt) Snack:  2 handfuls popcorn, water Second meal:  noodles, tomato sauce, cheese, salad with dressing, apple juice (127 mg/dL, reported as 2 hour post prandial per Pt) Snack:  none napping Third meal:  fish, potatoes or noodles with cheese or green beans, corn, chicken, water  Snack:  none  Beverages: water,  juice    NUTRITION INTERVENTION  Nutrition education (E-1) on the following topics:   Initial Follow-up  [x]  []  Definition of Gestational Diabetes [x]  []  Why dietary management is important in controlling blood glucose [x]  []  Effects each nutrient has on blood glucose levels [x]  []  Simple carbohydrates vs complex carbohydrates [x]  []  Fluid intake [x]  []  Creating a balanced meal plan [x]  []  Carbohydrate counting  [x]  []  When to check blood glucose levels [x]  []  Proper blood glucose monitoring techniques [x]  []  Effect of stress and stress reduction techniques  [x]  []  Exercise effect on blood glucose levels, appropriate exercise during pregnancy [x]  []  Importance of limiting caffeine  and abstaining from alcohol and smoking [x]  []  Medications used for blood sugar control during pregnancy [x]  []  Hypoglycemia and rule of 15 [x]  []  Postpartum self care   Patient has a meter prior to visit. Patient is instructed to begin testing pre breakfast and 1 hours after each meal. Patient instructed to monitor glucose levels: QID FBS: 60 - <= 95 mg/dL; 1 hour: <= 859 mg/dL  Patient received handouts: Nutrition Diabetes and Pregnancy Carbohydrate Counting List 1 hour Blood glucose log Snack ideas for diabetes during pregnancy Plate Planner  Patient will be seen for follow-up: 06/06/2024

## 2024-04-19 NOTE — Telephone Encounter (Addendum)
 RN attempted to call pt about results.  GDM testing supplies sent to pharmacy, diabetes education referral placed.  Pt needs to be scheduled.  Left voicemail with office callback number.   Waddell, RN  ----- Message from Nidia Daring sent at 04/19/2024  8:34 AM EDT ----- Early glucose test show GDM, please send testing supplies and coordinate DM educator visit, notified in my chart ----- Message ----- From: Interface, [facility] Lab Results In Sent: 04/19/2024   7:39 AM EDT To: Nidia LITTIE Daring, FNP

## 2024-04-19 NOTE — Telephone Encounter (Signed)
 RN advised pt of results.  She is scheduled for diabetes education visit.  Pt understanding of results and will bring supplies to education appointment.    Waddell, RN

## 2024-04-25 ENCOUNTER — Encounter: Attending: Obstetrics and Gynecology | Admitting: Dietician

## 2024-04-25 ENCOUNTER — Other Ambulatory Visit: Payer: Self-pay

## 2024-04-25 ENCOUNTER — Ambulatory Visit (INDEPENDENT_AMBULATORY_CARE_PROVIDER_SITE_OTHER): Admitting: Dietician

## 2024-04-25 DIAGNOSIS — O24419 Gestational diabetes mellitus in pregnancy, unspecified control: Secondary | ICD-10-CM | POA: Insufficient documentation

## 2024-04-25 DIAGNOSIS — Z3A16 16 weeks gestation of pregnancy: Secondary | ICD-10-CM

## 2024-04-25 DIAGNOSIS — Z713 Dietary counseling and surveillance: Secondary | ICD-10-CM | POA: Diagnosis not present

## 2024-04-25 DIAGNOSIS — Z3A00 Weeks of gestation of pregnancy not specified: Secondary | ICD-10-CM | POA: Diagnosis not present

## 2024-05-03 DIAGNOSIS — O99210 Obesity complicating pregnancy, unspecified trimester: Secondary | ICD-10-CM | POA: Insufficient documentation

## 2024-05-07 ENCOUNTER — Ambulatory Visit (INDEPENDENT_AMBULATORY_CARE_PROVIDER_SITE_OTHER)

## 2024-05-07 ENCOUNTER — Other Ambulatory Visit: Payer: Self-pay

## 2024-05-07 VITALS — BP 129/80 | HR 97 | Wt 387.0 lb

## 2024-05-07 DIAGNOSIS — O24419 Gestational diabetes mellitus in pregnancy, unspecified control: Secondary | ICD-10-CM | POA: Diagnosis not present

## 2024-05-07 DIAGNOSIS — O09892 Supervision of other high risk pregnancies, second trimester: Secondary | ICD-10-CM

## 2024-05-07 DIAGNOSIS — F321 Major depressive disorder, single episode, moderate: Secondary | ICD-10-CM

## 2024-05-07 DIAGNOSIS — O10919 Unspecified pre-existing hypertension complicating pregnancy, unspecified trimester: Secondary | ICD-10-CM

## 2024-05-07 DIAGNOSIS — O09899 Supervision of other high risk pregnancies, unspecified trimester: Secondary | ICD-10-CM

## 2024-05-07 DIAGNOSIS — Z3480 Encounter for supervision of other normal pregnancy, unspecified trimester: Secondary | ICD-10-CM

## 2024-05-07 DIAGNOSIS — O10912 Unspecified pre-existing hypertension complicating pregnancy, second trimester: Secondary | ICD-10-CM

## 2024-05-07 DIAGNOSIS — Z6841 Body Mass Index (BMI) 40.0 and over, adult: Secondary | ICD-10-CM

## 2024-05-07 DIAGNOSIS — J4520 Mild intermittent asthma, uncomplicated: Secondary | ICD-10-CM

## 2024-05-07 DIAGNOSIS — Z3A18 18 weeks gestation of pregnancy: Secondary | ICD-10-CM

## 2024-05-07 MED ORDER — METFORMIN HCL ER 500 MG PO TB24: 500.0000 mg | ORAL_TABLET | Freq: Two times a day (BID) | ORAL | 1 refills | Status: DC

## 2024-05-07 NOTE — Progress Notes (Signed)
 Subjective:  Autumn Duarte is a 28 y.o. H4E8875 at [redacted]w[redacted]d being seen today for ongoing prenatal care.  She is currently monitored for the following issues for this high-risk pregnancy and has PROBLEMS WITH HEARING; BMI 50.0-59.9, adult (HCC); Chronic hypertension during pregnancy, antepartum; Moderate major depression (HCC); Prediabetes; Adjustment disorder with mixed anxiety and depressed mood; Mild intermittent asthma without complication; History of preterm delivery, currently pregnant; Ruptured left tubal ectopic pregnancy; Supervision of other normal pregnancy, antepartum; Prediabetes in mother during pregnancy; Gestational diabetes mellitus (GDM) affecting pregnancy; and Obesity affecting pregnancy, antepartum on their problem list.  Patient reports + constipation, +mild cramping, +round ligament pain, no vaginal bleeding, no vaginal discharge   Vag. Bleeding: None   Denies leaking of fluid.   The following portions of the patient's history were reviewed and updated as appropriate: allergies, current medications, past family history, past medical history, past social history, past surgical history and problem list. Problem list updated.  Objective:   Vitals:   05/07/24 1442  BP: 129/80  Pulse: 97  Weight: (!) 175.5 kg    Fetal Status: Fetal Heart Rate (bpm): 148         General:  Alert, oriented and cooperative. Patient is in no acute distress.  Skin: Skin is warm and dry. No rash noted.   Respiratory: Normal respiratory effort, no problems with respiration noted  Abdomen: Soft, gravid, appropriate for gestational age. Pain/Pressure: Present     Pelvic: Vag. Bleeding: None     Cervical exam deferred        Extremities: Normal range of motion.     Mental Status: Normal mood and affect. Normal behavior. Normal judgment and thought content.    Assessment and Plan:  Pregnancy: [id number] at [redacted]w[redacted]d  1. Supervision of other normal pregnancy, antepartum (Primary) AFP today  2. Chronic  hypertension during pregnancy, antepartum Encouraged to monitor with BabyScripts  3. Gestational diabetes mellitus (GDM) affecting pregnancy -cont BG logs (doing 1hr pp), most above goal -started metformin 500mg  BID  4. History of preterm delivery, currently pregnant  5. BMI 60.0-69.9, adult (HCC)  6. Moderate major depression (HCC) Monitor, on sertraline   7. Mild intermittent asthma without complication On Advair and albuterol   General obstetric precautions including but not limited to fetal movement were reviewed in detail with the patient. Please refer to After Visit Summary for other counseling recommendations.   Follow up in 2 weeks   Trudy Leeroy NOVAK, MD

## 2024-05-10 ENCOUNTER — Ambulatory Visit: Payer: Self-pay

## 2024-05-10 LAB — AFP, SERUM, OPEN SPINA BIFIDA
AFP MoM: 0.86
AFP Value: 25.2 ng/mL
Gest. Age on Collection Date: 18 wk
Maternal Age At EDD: 27.1 a
OSBR Risk 1 IN: 10000
Test Results:: NEGATIVE
Weight: 387 [lb_av]

## 2024-05-16 ENCOUNTER — Ambulatory Visit

## 2024-05-16 ENCOUNTER — Ambulatory Visit: Attending: Obstetrics and Gynecology | Admitting: Obstetrics and Gynecology

## 2024-05-16 ENCOUNTER — Other Ambulatory Visit: Payer: Self-pay | Admitting: *Deleted

## 2024-05-16 VITALS — BP 129/99 | HR 105

## 2024-05-16 DIAGNOSIS — O09212 Supervision of pregnancy with history of pre-term labor, second trimester: Secondary | ICD-10-CM | POA: Diagnosis not present

## 2024-05-16 DIAGNOSIS — O99210 Obesity complicating pregnancy, unspecified trimester: Secondary | ICD-10-CM

## 2024-05-16 DIAGNOSIS — Z362 Encounter for other antenatal screening follow-up: Secondary | ICD-10-CM

## 2024-05-16 DIAGNOSIS — Z363 Encounter for antenatal screening for malformations: Secondary | ICD-10-CM | POA: Diagnosis not present

## 2024-05-16 DIAGNOSIS — O24419 Gestational diabetes mellitus in pregnancy, unspecified control: Secondary | ICD-10-CM

## 2024-05-16 DIAGNOSIS — O99512 Diseases of the respiratory system complicating pregnancy, second trimester: Secondary | ICD-10-CM

## 2024-05-16 DIAGNOSIS — E669 Obesity, unspecified: Secondary | ICD-10-CM | POA: Diagnosis not present

## 2024-05-16 DIAGNOSIS — O99212 Obesity complicating pregnancy, second trimester: Secondary | ICD-10-CM | POA: Insufficient documentation

## 2024-05-16 DIAGNOSIS — O10012 Pre-existing essential hypertension complicating pregnancy, second trimester: Secondary | ICD-10-CM | POA: Insufficient documentation

## 2024-05-16 DIAGNOSIS — J45909 Unspecified asthma, uncomplicated: Secondary | ICD-10-CM | POA: Insufficient documentation

## 2024-05-16 DIAGNOSIS — Z7982 Long term (current) use of aspirin: Secondary | ICD-10-CM | POA: Diagnosis not present

## 2024-05-16 DIAGNOSIS — O09299 Supervision of pregnancy with other poor reproductive or obstetric history, unspecified trimester: Secondary | ICD-10-CM

## 2024-05-16 DIAGNOSIS — Z3A13 13 weeks gestation of pregnancy: Secondary | ICD-10-CM

## 2024-05-16 DIAGNOSIS — O10919 Unspecified pre-existing hypertension complicating pregnancy, unspecified trimester: Secondary | ICD-10-CM

## 2024-05-16 DIAGNOSIS — Z3A19 19 weeks gestation of pregnancy: Secondary | ICD-10-CM

## 2024-05-16 DIAGNOSIS — E66813 Obesity, class 3: Secondary | ICD-10-CM

## 2024-05-16 DIAGNOSIS — O09899 Supervision of other high risk pregnancies, unspecified trimester: Secondary | ICD-10-CM

## 2024-05-16 DIAGNOSIS — O24415 Gestational diabetes mellitus in pregnancy, controlled by oral hypoglycemic drugs: Secondary | ICD-10-CM | POA: Insufficient documentation

## 2024-05-16 NOTE — Progress Notes (Signed)
 Maternal-Fetal Medicine Consultation  Name: Autumn Duarte  MRN: [id number]  GA: [id number] [redacted]w[redacted]d   Patient is here for fetal anatomy scan. On cell-free fetal DNA screening, the risks of aneuploidies are not increased. MSAFP screening showed low risk for open-neural tube defects. Her problems include: - Early gestational diabetes.  Patient takes metformin XR 500 mg twice daily.  She reports her fasting levels range from 100 220 mg/dL and postprandial levels are below 120 mg/dL.  Recent hemoglobin A1c was 5.7%. - Pregravid BMI 61. - Chronic hypertension.  Well-controlled without antihypertensives.  Patient has blood pressure cuff at home and reports her home blood pressures are within normal range.  Blood pressure today at our office is 129/send 99 mmHg.  She takes low-dose aspirin  prophylaxis. Obstetric history 11/2018: Term vaginal delivery (37 weeks) of twins.  Her pregnancy was uncomplicated. 07/2022: Preterm vaginal delivery at 33 weeks 6 days gestation of twins.  Patient was admitted at 32 weeks with preterm premature rupture membranes. All her children are alive and well.  Ultrasound Fetal biometry is consistent with her previously-established dates. Normal amniotic fluid.  No makers of aneuploidies or fetal structural defects are seen.  Patient understands the limitations of ultrasound in detecting fetal anomalies. On transabdominal scan, cervix looks long and closed.   Gestational diabetes I explained the diagnosis of gestational diabetes.  I emphasized the importance of good blood glucose control to prevent adverse fetal or neonatal outcomes.  I discussed normal range of blood glucose values. I encouraged her to check her blood glucose regularly. Patient is aware that if diabetes is not well controlled on metformin, she will require insulin treatment. Control can be challenging with advancing gestation.  Possible complications of gestational diabetes include fetal macrosomia, shoulder  dystocia and birth injuries, stillbirth (in poorly controlled diabetes) and neonatal respiratory syndrome and other complications.  Exercise reduces the need for insulin.  Medical treatment includes oral hypoglycemics or insulin.  Timing of delivery: In well-controlled diabetes on diet, patient can be delivered at 39-weeks' gestation. Vaginal delivery can be safely attempted. Type 2 diabetes develops in up to 50% of women with GDM. I recommend postpartum screening with 75-g glucose load at 6 to 12 weeks after delivery. We recommend serial fetal growth assessments till delivery, and weekly antenatal testing from [redacted] weeks gestation until delivery. I explained the components of antenatal testing (biophysical profile) and that if it reassuring, the risk of stillbirth is less than 1 per 1,000 births in one week.   Chronic hypertension Her blood pressures are reportedly well-controlled without antihypertensives.  I discussed the importance of good blood pressure control to prevent maternal and fetal adverse outcomes.  I discussed the benefit of low-dose aspirin  prophylaxis that delays or prevents preeclampsia. Treatment of mild hypertension is associated with significant reductions in preeclampsia with severe features, severe hypertension, medically indicated preterm birth and low birthweight. Mild chronic hypertension is defined as systolic blood pressure of >=140 mm Hg and <160 mm Hg or diastolic BP of >=90 mm Hg and <889 mm Hg or both.  Pregravid BMI 61 Grade 3 obesity is independently associated with increased risk of stillbirth (2.5- to 3-fold), but the absolute risk is very small.  I discussed protocol of weekly antenatal testing from [redacted] weeks gestation until delivery. Obesity is associated with increased risk for gestational diabetes and gestational hypertension. Ultrasound has limitations in the resolution of ultrasound images and fetal anomalies may be missed.  Weight loss is not advised in  pregnancy and a  weight gain of 11 to 20 pounds is reasonable.[id number]  Recommendations -Fetal growth assessments in 5 weeks and 9 weeks. - Weekly antenatal testing from [redacted] weeks gestation until delivery     Consultation including face-to-face (more than 50%) counseling 45 minutes.

## 2024-05-23 ENCOUNTER — Encounter: Admitting: Obstetrics & Gynecology

## 2024-05-23 NOTE — Progress Notes (Deleted)
 Patient was seen for Gestational Diabetes on *** Start time [phone] and End time 1015   Estimated due date: 10/08/2024; [redacted]w[redacted]d   Clinical: Medications:  Current Outpatient Medications:    Accu-Chek Softclix Lancets lancets, Use as instructed; check blood glucose 4 times daily, Disp: 100 each, Rfl: 11   albuterol  (VENTOLIN  HFA) 108 (90 Base) MCG/ACT inhaler, Inhale 2 puffs into the lungs every 6 (six) hours as needed for wheezing or shortness of breath., Disp: 1 each, Rfl: 2   aspirin  EC 81 MG tablet, Take 2 tablets (162 mg total) by mouth daily. Start taking when you are [redacted] weeks pregnant for rest of pregnancy for prevention of preeclampsia, Disp: 300 tablet, Rfl: 2   Blood Glucose Monitoring Suppl (ACCU-CHEK GUIDE) w/Device KIT, 1 kit by Does not apply route in the morning, at noon, in the evening, and at bedtime., Disp: 1 kit, Rfl: 0   Blood Pressure Monitoring (BLOOD PRESSURE KIT) DEVI, 1 Device by Does not apply route as needed., Disp: 1 each, Rfl: 0   fluticasone-salmeterol (ADVAIR) 100-50 MCG/ACT AEPB, Inhale 1 puff into the lungs 2 (two) times daily as needed (as needed). (Patient not taking: Reported on 04/25/2024), Disp: , Rfl:    glucose blood (ACCU-CHEK GUIDE TEST) test strip, Use 4 times a day to include fasting before breakfast, breakfast, lunch, and dinner, Disp: 100 strip, Rfl: 12   metFORMIN (GLUCOPHAGE-XR) 500 MG 24 hr tablet, Take 1 tablet (500 mg total) by mouth 2 (two) times daily with a meal., Disp: 180 tablet, Rfl: 1   Misc. Devices (GOJJI WEIGHT SCALE) MISC, 1 Device by Does not apply route as needed., Disp: 1 each, Rfl: 0   Prenatal Vit-Fe Fumarate-FA (PREPLUS) 27-1 MG TABS, Take 1 tablet by mouth daily. (Patient not taking: Reported on 05/07/2024), Disp: 30 tablet, Rfl: 13   prenatal vitamin w/FE, FA (PRENATAL 1 + 1) 27-1 MG TABS tablet, Take 1 tablet by mouth daily at 12 noon., Disp: 30 tablet, Rfl: 11   sertraline  (ZOLOFT ) 50 MG tablet, Take 2 tablets by mouth daily. (Patient  not taking: Reported on 05/07/2024), Disp: , Rfl:   Medical History:  Past Medical History:  Diagnosis Date   Asthma    last used inhaler 2 months ago   Bronchitis    Ectopic pregnancy 05/2023   Eczema    Gestational diabetes    History of postpartum hemorrhage 07/18/2022   History of preterm delivery 07/08/2022   Hypertension    Obesity    Seasonal allergies     Labs: OGTT fasting 92, 1 hour 151, 2 hour 95 on 04/18/2024 Lab Results  Component Value Date   HGBA1C 5.7 (H) 04/12/2024   Dietary and Lifestyle History:  10/7: met 500 BID 04/25/2024 : Pt presents today alone with her meter. Pt reports she does the majority of the cooking and shopping for family of six. Pt reports she works full time on third shift from 2 am-10:30 am mostly sitting. Pt reports she avoids beef and pork for personal preferences. Pt states she can avoid her food allergy of shellfish successfully.  Pt performed 2 hour post prandial in office today with a value of 120 mg/dL. Pt reports she started walking daily for 15 minutes upon diagnosis of GDM. Pt states a willingness to avoid juice intake moving forward for optimal blood sugar control.    Pt reports she falls asleep at night before she is able to check her blood sugar and states testing an hour after meals  could prove to be a better schedule. Pt reports her current schedule is to wake up near 12:30p-2p daily with sleep intervals of 2-4 hour 2-3 times daily. All Pt's questions were answered during this encounter.   Physical Activity: walking 15 minutes daily  Stress: 5 out of 10 / self care includes: sleep Sleep: interrupted; on average hours 2-4 hours and takes naps 2-3 hours *2-3 daily    24 hr Recall:  First Meal:  ~1-2 pm: [place], 2 slices of whole wheat, mayo, cheese, lettuce, water, apple juice (127 mg/dL, reported as 2 hour post prandial per Pt) Snack:  2 handfuls popcorn, water Second meal:  noodles, tomato sauce, cheese, salad with dressing, apple  juice (127 mg/dL, reported as 2 hour post prandial per Pt) Snack:  none napping Third meal:  fish, potatoes or noodles with cheese or green beans, corn, chicken, water  Snack:  none  Beverages: water, juice    NUTRITION INTERVENTION  Nutrition education (E-1) on the following topics:   Initial Follow-up  [x]  []  Definition of Gestational Diabetes [x]  []  Why dietary management is important in controlling blood glucose [x]  []  Effects each nutrient has on blood glucose levels [x]  []  Simple carbohydrates vs complex carbohydrates [x]  []  Fluid intake [x]  []  Creating a balanced meal plan [x]  []  Carbohydrate counting  [x]  []  When to check blood glucose levels [x]  []  Proper blood glucose monitoring techniques [x]  []  Effect of stress and stress reduction techniques  [x]  []  Exercise effect on blood glucose levels, appropriate exercise during pregnancy [x]  []  Importance of limiting caffeine  and abstaining from alcohol and smoking [x]  []  Medications used for blood sugar control during pregnancy [x]  []  Hypoglycemia and rule of 15 [x]  []  Postpartum self care   Patient has a meter prior to visit. Patient is instructed to begin testing pre breakfast and 1 hours after each meal. Patient instructed to monitor glucose levels: QID FBS: 60 - <= 95 mg/dL; 1 hour: <= 859 mg/dL  Patient received handouts: Nutrition Diabetes and Pregnancy Carbohydrate Counting List 1 hour Blood glucose log Snack ideas for diabetes during pregnancy Plate Planner  Patient will be seen for follow-up: 06/06/2024

## 2024-05-30 ENCOUNTER — Telehealth (INDEPENDENT_AMBULATORY_CARE_PROVIDER_SITE_OTHER): Admitting: Obstetrics & Gynecology

## 2024-05-30 ENCOUNTER — Encounter: Admitting: Obstetrics & Gynecology

## 2024-05-30 VITALS — BP 133/75 | HR 109

## 2024-05-30 DIAGNOSIS — O10919 Unspecified pre-existing hypertension complicating pregnancy, unspecified trimester: Secondary | ICD-10-CM

## 2024-05-30 DIAGNOSIS — Z3480 Encounter for supervision of other normal pregnancy, unspecified trimester: Secondary | ICD-10-CM

## 2024-05-30 DIAGNOSIS — Z3A21 21 weeks gestation of pregnancy: Secondary | ICD-10-CM | POA: Diagnosis not present

## 2024-05-30 DIAGNOSIS — O24419 Gestational diabetes mellitus in pregnancy, unspecified control: Secondary | ICD-10-CM

## 2024-05-30 DIAGNOSIS — O10912 Unspecified pre-existing hypertension complicating pregnancy, second trimester: Secondary | ICD-10-CM

## 2024-05-30 NOTE — Progress Notes (Signed)
 I connected with Autumn Duarte 05/30/24 at  2:35 PM EDT by: MyChart video and verified that I am speaking with the correct person using two identifiers.  Patient is located at home and provider is located at [facility].     I discussed the limitations, risks, security and privacy concerns of performing an evaluation and management service by MyChart video and the availability of in person appointments. I also discussed with the patient that there may be a patient responsible charge related to this service. By engaging in this virtual visit, you consent to the provision of healthcare.  Additionally, you authorize for your insurance to be billed for the services provided during this visit.  The patient expressed understanding and agreed to proceed.  The following staff members participated in the virtual visit:  Rosalynn    PRENATAL VISIT NOTE  Subjective:  Autumn Duarte is a 28 y.o. [id number] at [redacted]w[redacted]d  for virtual visit for ongoing prenatal care.  She is currently monitored for the following issues for this high-risk pregnancy and has PROBLEMS WITH HEARING; BMI 50.0-59.9, adult (HCC); Chronic hypertension during pregnancy, antepartum; Moderate major depression (HCC); Prediabetes; Adjustment disorder with mixed anxiety and depressed mood; Mild intermittent asthma without complication; History of preterm delivery, currently pregnant; Ruptured left tubal ectopic pregnancy; Supervision of other normal pregnancy, antepartum; Prediabetes in mother during pregnancy; Gestational diabetes mellitus (GDM) affecting pregnancy; and Obesity affecting pregnancy, antepartum on their problem list.  Patient reports no complaints.   . Vag. Bleeding: None.  Movement: Present. Denies leaking of fluid.   The following portions of the patient's history were reviewed and updated as appropriate: allergies, current medications, past family history, past medical history, past social history, past surgical history and problem list.    Objective:   Vitals:   05/30/24 1444  BP: 133/75  Pulse: (!) 109   Self-Obtained  Fetal Status:     Movement: Present     Assessment and Plan:  Pregnancy: [id number] at [redacted]w[redacted]d 1. Chronic hypertension during pregnancy, antepartum (Primary) controlled  2. Supervision of other normal pregnancy, antepartum DM- FBS <95 and PP <135, continue diet control  Preterm labor symptoms and general obstetric precautions including but not limited to vaginal bleeding, contractions, leaking of fluid and fetal movement were reviewed in detail with the patient.  Return in about 4 weeks (around 06/27/2024).  Future Appointments  Date Time Provider Department Center  06/06/2024  9:15 AM [facility]  06/20/2024 11:15 AM [facility] PROVIDER 1 [facility]  06/20/2024 11:30 AM [facility] US4 [facility]  07/18/2024 11:15 AM [facility] PROVIDER 1 [facility]  07/18/2024 11:30 AM [facility] US2 [facility]     Time spent on virtual visit: 12 minutes  Lynwood Solomons, MD

## 2024-05-31 ENCOUNTER — Inpatient Hospital Stay (HOSPITAL_COMMUNITY)
Admission: AD | Admit: 2024-05-31 | Discharge: 2024-05-31 | Disposition: A | Discharge status: Home / Self Care | Attending: Obstetrics and Gynecology | Admitting: Obstetrics and Gynecology

## 2024-05-31 DIAGNOSIS — B001 Herpesviral vesicular dermatitis: Secondary | ICD-10-CM

## 2024-05-31 DIAGNOSIS — M545 Low back pain, unspecified: Secondary | ICD-10-CM | POA: Diagnosis not present

## 2024-05-31 DIAGNOSIS — H9202 Otalgia, left ear: Secondary | ICD-10-CM | POA: Diagnosis not present

## 2024-05-31 DIAGNOSIS — K1379 Other lesions of oral mucosa: Secondary | ICD-10-CM | POA: Insufficient documentation

## 2024-05-31 DIAGNOSIS — O99512 Diseases of the respiratory system complicating pregnancy, second trimester: Secondary | ICD-10-CM | POA: Insufficient documentation

## 2024-05-31 DIAGNOSIS — Z3A21 21 weeks gestation of pregnancy: Secondary | ICD-10-CM

## 2024-05-31 DIAGNOSIS — J45909 Unspecified asthma, uncomplicated: Secondary | ICD-10-CM | POA: Insufficient documentation

## 2024-05-31 DIAGNOSIS — F17290 Nicotine dependence, other tobacco product, uncomplicated: Secondary | ICD-10-CM | POA: Diagnosis not present

## 2024-05-31 DIAGNOSIS — J029 Acute pharyngitis, unspecified: Secondary | ICD-10-CM

## 2024-05-31 DIAGNOSIS — O99332 Smoking (tobacco) complicating pregnancy, second trimester: Secondary | ICD-10-CM | POA: Diagnosis not present

## 2024-05-31 DIAGNOSIS — O26892 Other specified pregnancy related conditions, second trimester: Secondary | ICD-10-CM

## 2024-05-31 DIAGNOSIS — O99891 Other specified diseases and conditions complicating pregnancy: Secondary | ICD-10-CM | POA: Diagnosis present

## 2024-05-31 DIAGNOSIS — O24415 Gestational diabetes mellitus in pregnancy, controlled by oral hypoglycemic drugs: Secondary | ICD-10-CM | POA: Insufficient documentation

## 2024-05-31 DIAGNOSIS — O99212 Obesity complicating pregnancy, second trimester: Secondary | ICD-10-CM | POA: Insufficient documentation

## 2024-05-31 LAB — URINALYSIS, ROUTINE W REFLEX MICROSCOPIC
Bacteria, UA: NONE SEEN
Bilirubin Urine: NEGATIVE
Glucose, UA: NEGATIVE mg/dL
Ketones, ur: NEGATIVE mg/dL
Leukocytes,Ua: NEGATIVE
Nitrite: NEGATIVE
Protein, ur: 30 mg/dL — AB
Specific Gravity, Urine: 1.028 (ref 1.005–1.030)
pH: 6 (ref 5.0–8.0)

## 2024-05-31 LAB — GROUP A STREP BY PCR: Group A Strep by PCR: NOT DETECTED

## 2024-05-31 LAB — RESP PANEL BY RT-PCR (RSV, FLU A&B, COVID)  RVPGX2
Influenza A by PCR: NEGATIVE
Influenza B by PCR: NEGATIVE
Resp Syncytial Virus by PCR: NEGATIVE
SARS Coronavirus 2 by RT PCR: NEGATIVE

## 2024-05-31 MED ORDER — CYCLOBENZAPRINE HCL 10 MG PO TABS: 10.0000 mg | ORAL_TABLET | Freq: Two times a day (BID) | ORAL | 2 refills | Status: AC | PRN

## 2024-05-31 MED ORDER — ACETAMINOPHEN 500 MG PO TABS
1000.0000 mg | ORAL_TABLET | Freq: Once | ORAL | Status: AC
  Administered 2024-05-31: 1000 mg via ORAL
  Filled 2024-05-31: qty 2

## 2024-05-31 NOTE — MAU Note (Signed)
 Autumn Duarte is a 28 y.o. at [redacted]w[redacted]d here in MAU reporting having a cold sore on left side of lower lip, sore throat, and L ear pain. Afraid she may have strep throat as her throat feels alittle tight. Some lower back pain. Denies VB or LOF. No one at home has been sick. Tylenol  not helping.   LMP: na Onset of complaint: thurs am Pain score: 8 Vitals:   05/31/24 0153 05/31/24 0156  BP:  (!) 140/86  Pulse: (!) 103   Resp: 19   Temp: 98.1 F (36.7 C)   SpO2: 99%      FHT: 143  Lab orders placed from triage: ua

## 2024-05-31 NOTE — Discharge Instructions (Signed)
 Upper Respiratory Illness:    Aches/Pains, Fever, Headache OTC Acetaminophen  (Tylenol ) 500 mg tablets - take max 2 tablets (1000 mg) every 6 hours (4 times per day). It can be helpful to take this on a schedule every 6 hours the first couple of days to stay ahead of the pain and help with inflammation.   Sinus Congestion Prescription fluticasone (Flonase) as directed Nasal Saline if desired to rinse Diphenhydramine  (Benadryl ) 25 mg tablets - take max 2 tablets every 4 hours, can make you drowsy/sleepy Cetirizine (Zyrtec) - similar to Benadryl  but does not make you sleepy Vicks Vaporub - opens up nasal passages to make breathing easier Breath right strips   Cough & Sore Throat Alcohol-free cough drops Chloraseptic throat spray Cepacol throat lozenges Cold-Eeze - up to three times per day  Dextromethorphan (Robitussin, others) - cough suppressant Guaifenesin  (Robitussin, Mucinex , others) - expectorant (helps cough up mucus) Robitussin DM (plain only, alcohol-free) - includes cough suppressant! (Dextromethorphan and Guaifenesin  also come in a combination tablet/syrup) Lozenges w/ Benzocaine  + Menthol  (Cepacol) Honey - as much as you want! It has some antimicrobial properties and helps soothe the throat Teas which coat the throat - look for ingredients Elm Bark, Licorice Root, Marshmallow Root Any warm beverage or soup can be soothing for sore throat   Other Zinc Lozenges within 24 hours of symptoms onset - mixed evidence this shortens the duration of the common cold   BACK PAIN: Rest Use ice/heat/warm bath Increase PO fluids, aim for 80-100 ounces of fluid intake each day Tylenol  is safe to take for pain. Do not take more than 4000 mg total in 24 hours. Flexeril  as needed and as prescribed Pregnancy support belt Pregnancy Yoga: There are free options through YouTube and you can purchase DVDs easily.  Float therapy: This involves soaking in a warm bath of magnesium to help relax  muscles. Local places that have this option are [facility] in [place] or [facility] in [facility] Massage therapy: This is safe in pregnancy. Just assure your therapist is trained in prenatal massage. Some local options include [employer] and [facility] Chiropractic care: This involves re-aligning the bones and muscles of the body. You want to make sure the practitioner has training in pregnancy ([place] Method).  A local option is [facility] [employer] and [employer]. [phone] [email or web address] and there are many other chiropractic offices that do adjustments in pregnancy  BACK PAIN EXERCISES Stomach tone  Lie on your front with your arms by your side, head on one side. Pull in your stomach muscles, centered around your belly button. Hold for five seconds. Repeat three times. Build up to 10 seconds and repeat during the day, while walking or standing. Keep breathing during this exercise!  Pelvic tilt  Lie down with your knees bent. Tighten your stomach muscles, flattening your back against the floor. Hold for five seconds. Repeat five times.  Knee rolls  Lie on your back with your knees bent and your feet together. Roll your knees to one side, keeping your shoulders flat on the bed or floor, and hold for 10 seconds. Roll your knees back to the starting position, and then over to the other side and repeat. Do this exercise three times on each side.  Knees to chest  Lie on your back, with your knees bent and feet flat on the floor or bed. Bring one knee up and use your hands to pull it gently towards  your chest. Hold the leg in position for five seconds, and then relax. Repeat this exercise with the other knee. Do the exercise five times on each side.  Buttock tone  Lie on your front and bend one leg up behind you. Lift your bent knee just off the floor. Hold for up to eight seconds. Repeat five times each  side.   Deep stomach muscle tone  Kneel on all fours with a small curve in your lower back. Let your stomach relax completely. Pull the lower part of your stomach upwards so that you lift your back (without arching it) away from the floor. Hold for 10 seconds. Keep breathing! Repeat 10 times.  Back stabilizer  Kneel on all fours with your back straight. Tighten your stomach. Keeping your back in this position, raise one arm in front of you and hold for 10 seconds. Try to keep your pelvis level and don't rotate your body. Repeat 10 times each side. To progress, try lifting one leg behind you instead of raising your arm.  Leg raise  Lie face down, though you might want to turn your head to one side if this is more comfortable. Tighten your stomach and buttock muscles to lift one leg slightly off the floor, while keeping your hips flat on the ground. Hold this position for 5 to 10 seconds and repeat 3 times.   Arm raise  Lie on your stomach with your back in a neutral position. Tense the muscles in your lower stomach and raise one arm upwards. Hold this position for five seconds, and then relax your arm. Repeat this exercise 10 times with each arm.  Hamstring stretch  Steady yourself, then put one leg up on a chair. Keeping your raised leg straight, bend the supporting knee forward to stretch your hamstrings. Repeat three times each side. Please note: For those with acute sciatica this hamstring stretch may also pull on the sciatic nerve, making it feel worse. If in doubt, ask a physiotherapist if this exercise is suitable for you.  One-leg stand  Steady yourself with one hand on a wall or work surface for support. Bend one leg up behind you. Hold your foot for 10 seconds and repeat three times each side. Try to keep your knees and thighs level with one another.  Deep lunge  Kneel on one knee, the other foot in front. Lift your back knee up, making sure you keep looking forwards. Push  your hips forward. Hold for five seconds and repeat three times each side. Try to keep your upper body upright, avoid bending or leaning your upper body forwards.

## 2024-05-31 NOTE — Progress Notes (Signed)
 Written and verbal d/c instructions given by Joesph Sear PA and pt then d/c home by provider

## 2024-05-31 NOTE — MAU Provider Note (Signed)
 Chief Complaint:  Sore Throat, Mouth Lesions, and Back Pain   HPI   None     Autumn Duarte is a 28 y.o. [id number] at [redacted]w[redacted]d who presents to maternity admissions reporting sore throat, left ear pain, and cold sore on left lower lip. She reports symptoms started the morning of 10/30 and have worsened slightly. She is worries she might have strep throat because her throat feels tight. She has a sore on her left lower lip and is not sure if it is a cold sore or if she picked at the area and caused a wound. She also notes some lower back pain. She took Tylenol  about 8 hours ago with little improvement. Denies vaginal bleeding, leaking of fluids, contractions. Denies headache, abdominal pain, vision changes.  Pregnancy Course: Receives care at [facility] for [facility] . Prenatal records reviewed. Pregnancy complicated by cHTN, gDM, asthma, BMI >60.  Past Medical History:  Diagnosis Date   Asthma    last used inhaler 2 months ago   Bronchitis    Ectopic pregnancy 05/2023   Eczema    Gestational diabetes    History of postpartum hemorrhage 07/18/2022   History of preterm delivery 07/08/2022   Hypertension    Obesity    Seasonal allergies    OB History  Gravida Para Term Preterm AB Living  5 2 1 1 2 4   SAB IAB Ectopic Multiple Live Births  1  1 2 4     # Outcome Date GA Lbr Len/2nd Weight Sex Type Anes PTL Lv  5 Current           4 SAB 11/2023          3 Ectopic 2024          2A Preterm 07/18/22 [redacted]w[redacted]d / 00:09 2550 g M Vag-Spont None  LIV     Birth Comments: apneic and bradycardic requiring PPV- see note.  2B Preterm 07/18/22 [redacted]w[redacted]d / 00:45 2460 g F Vag-Spont None  LIV  1A Term 12/04/18 [redacted]w[redacted]d 11:32 / 01:20 2764 g F Vag-Spont EPI  LIV     Birth Comments: WDL  1B Term 12/04/18 [redacted]w[redacted]d 11:32 / 01:40 2554 g F Vag-Spont EPI  LIV     Birth Comments: WDL   Past Surgical History:  Procedure Laterality Date   WISDOM TOOTH EXTRACTION     Family History  Problem Relation  Age of Onset   Arthritis Mother    Hepatitis Mother    Thyroid disease Mother    Diabetes Father    Heart disease Father    Hypertension Father    Breast cancer Maternal Aunt    Breast cancer Maternal Grandmother    Social History   Tobacco Use   Smoking status: Former    Current packs/day: 0.00    Types: Cigarettes    Quit date: 03/19/2018    Years since quitting: 6.2   Smokeless tobacco: Never  Vaping Use   Vaping status: Every Day   Substances: Nicotine  Substance Use Topics   Alcohol use: Not Currently   Drug use: Never   Allergies  Allergen Reactions   Shellfish Allergy Itching, Shortness Of Breath and Hives   No medications prior to admission.    I have reviewed patient's Past Medical Hx, Surgical Hx, Family Hx, Social Hx, medications and allergies.   ROS  Pertinent items noted in HPI and remainder of comprehensive ROS otherwise negative.   PHYSICAL EXAM  Patient Vitals for the past  24 hrs:  BP Temp Pulse Resp SpO2 Height Weight  05/31/24 0156 (!) 140/86 -- -- -- -- -- --  05/31/24 0153 -- 98.1 F (36.7 C) (!) 103 19 99 % 5' 4 (1.626 m) (!) 177.8 kg    Constitutional: Well-developed, well-nourished female in no acute distress.  HEENT: atraumatic, normocephalic. Neck has normal ROM. EOM intact. Oropharynx moist. Mildly erythematous tonsils, no exudate, uvula midline. Cardiovascular: normal rate & rhythm, warm and well-perfused Respiratory: normal effort, no problems with respiration noted GI: Abd soft, non-tender, non-distended MSK: Extremities nontender, no edema, normal ROM Skin: warm and dry. Acyanotic, no jaundice or pallor. Neurologic: Alert and oriented x 4. No abnormal coordination. Psychiatric: Normal mood. Speech not slurred, not rapid/pressured. Patient is cooperative. GU: no CVA tenderness   Labs: Results for orders placed or performed during the hospital encounter of 05/31/24 (from the past 24 hours)  Urinalysis, Routine w reflex  microscopic -Urine, Clean Catch     Status: Abnormal   Collection Time: 05/31/24  2:15 AM  Result Value Ref Range   Color, Urine YELLOW YELLOW   APPearance CLEAR CLEAR   Specific Gravity, Urine 1.028 1.005 - 1.030   pH 6.0 5.0 - 8.0   Glucose, UA NEGATIVE NEGATIVE mg/dL   Hgb urine dipstick SMALL (A) NEGATIVE   Bilirubin Urine NEGATIVE NEGATIVE   Ketones, ur NEGATIVE NEGATIVE mg/dL   Protein, ur 30 (A) NEGATIVE mg/dL   Nitrite NEGATIVE NEGATIVE   Leukocytes,Ua NEGATIVE NEGATIVE   RBC / HPF 11-20 0 - 5 RBC/hpf   WBC, UA 0-5 0 - 5 WBC/hpf   Bacteria, UA NONE SEEN NONE SEEN   Squamous Epithelial / HPF 0-5 0 - 5 /HPF   Mucus PRESENT   Resp panel by RT-PCR (RSV, Flu A&B, Covid) Anterior Nasal Swab     Status: None   Collection Time: 05/31/24  2:16 AM   Specimen: Anterior Nasal Swab  Result Value Ref Range   SARS Coronavirus 2 by RT PCR NEGATIVE NEGATIVE   Influenza A by PCR NEGATIVE NEGATIVE   Influenza B by PCR NEGATIVE NEGATIVE   Resp Syncytial Virus by PCR NEGATIVE NEGATIVE  Group A Strep by PCR     Status: None   Collection Time: 05/31/24  2:44 AM   Specimen: Throat; Sterile Swab  Result Value Ref Range   Group A Strep by PCR NOT DETECTED NOT DETECTED    Imaging:  No results found.  MDM & MAU COURSE  MDM: Moderate  MAU Course: -BP mildly elevated on initial check. Afebrile.  -Swabs for strep, influenza, COVID, RSV. -Tylenol  for sore throat and back pain.  -Swabs negative.  Differential diagnosis considered for sore throat includes but is not limited to: viral pharyngitis, strep pharyngitis, tonsillitis, peritonsillar abscess, retropharyngeal abscess, foreign body, mononucleosis    Orders Placed This Encounter  Procedures   Resp panel by RT-PCR (RSV, Flu A&B, Covid) Anterior Nasal Swab   Group A Strep by PCR   Urinalysis, Routine w reflex microscopic -Urine, Clean Catch   Discharge patient   Meds ordered this encounter  Medications   acetaminophen  (TYLENOL )  tablet 1,000 mg   cyclobenzaprine  (FLEXERIL ) 10 MG tablet    Sig: Take 1 tablet (10 mg total) by mouth 2 (two) times daily as needed for muscle spasms.    Dispense:  20 tablet    Refill:  2    ASSESSMENT   1. Sore throat   2. Left ear pain   3. Back pain affecting  pregnancy   4. Cold sore   5. [redacted] weeks gestation of pregnancy     PLAN  Discharge home in stable condition with return precautions.  Discussed that symptoms of URI due to unnamed virus, recommend symptomatic management. Tylenol , Flexeril , and support belt for back pain. Tylenol  for sore throat. Provided list of safe OTC medications to treat URI symptoms.     Allergies as of 05/31/2024       Reactions   Shellfish Allergy Itching, Shortness Of Breath, Hives        Medication List     TAKE these medications    Accu-Chek Guide Test test strip Generic drug: glucose blood Use 4 times a day to include fasting before breakfast, breakfast, lunch, and dinner   Accu-Chek Guide w/Device Kit 1 kit by Does not apply route in the morning, at noon, in the evening, and at bedtime.   Accu-Chek Softclix Lancets lancets Use as instructed; check blood glucose 4 times daily   albuterol  108 (90 Base) MCG/ACT inhaler Commonly known as: VENTOLIN  HFA Inhale 2 puffs into the lungs every 6 (six) hours as needed for wheezing or shortness of breath.   aspirin  EC 81 MG tablet Take 2 tablets (162 mg total) by mouth daily. Start taking when you are [redacted] weeks pregnant for rest of pregnancy for prevention of preeclampsia   Blood Pressure Kit Devi 1 Device by Does not apply route as needed.   cyclobenzaprine  10 MG tablet Commonly known as: FLEXERIL  Take 1 tablet (10 mg total) by mouth 2 (two) times daily as needed for muscle spasms.   fluticasone-salmeterol 100-50 MCG/ACT Aepb Commonly known as: ADVAIR Inhale 1 puff into the lungs 2 (two) times daily as needed (as needed).   Gojji Weight Scale Misc 1 Device by Does not apply  route as needed.   metFORMIN 500 MG 24 hr tablet Commonly known as: GLUCOPHAGE-XR Take 1 tablet (500 mg total) by mouth 2 (two) times daily with a meal.   prenatal vitamin w/FE, FA 27-1 MG Tabs tablet Take 1 tablet by mouth daily at 12 noon.   PrePLUS 27-1 MG Tabs Take 1 tablet by mouth daily.   sertraline  50 MG tablet Commonly known as: ZOLOFT  Take 2 tablets by mouth daily.        Joesph DELENA Sear, PA

## 2024-06-06 ENCOUNTER — Other Ambulatory Visit: Payer: Self-pay

## 2024-06-06 NOTE — Progress Notes (Deleted)
 Patient was seen for Gestational Diabetes on *** Start time *** and End time ***  Estimated due date: 10/08/2024; ***w***d   Clinical: Medications:  Current Outpatient Medications:    Accu-Chek Softclix Lancets lancets, Use as instructed; check blood glucose 4 times daily, Disp: 100 each, Rfl: 11   albuterol  (VENTOLIN  HFA) 108 (90 Base) MCG/ACT inhaler, Inhale 2 puffs into the lungs every 6 (six) hours as needed for wheezing or shortness of breath., Disp: 1 each, Rfl: 2   aspirin  EC 81 MG tablet, Take 2 tablets (162 mg total) by mouth daily. Start taking when you are [redacted] weeks pregnant for rest of pregnancy for prevention of preeclampsia, Disp: 300 tablet, Rfl: 2   Blood Glucose Monitoring Suppl (ACCU-CHEK GUIDE) w/Device KIT, 1 kit by Does not apply route in the morning, at noon, in the evening, and at bedtime., Disp: 1 kit, Rfl: 0   Blood Pressure Monitoring (BLOOD PRESSURE KIT) DEVI, 1 Device by Does not apply route as needed., Disp: 1 each, Rfl: 0   cyclobenzaprine  (FLEXERIL ) 10 MG tablet, Take 1 tablet (10 mg total) by mouth 2 (two) times daily as needed for muscle spasms., Disp: 20 tablet, Rfl: 2   fluticasone-salmeterol (ADVAIR) 100-50 MCG/ACT AEPB, Inhale 1 puff into the lungs 2 (two) times daily as needed (as needed). (Patient not taking: Reported on 05/30/2024), Disp: , Rfl:    glucose blood (ACCU-CHEK GUIDE TEST) test strip, Use 4 times a day to include fasting before breakfast, breakfast, lunch, and dinner, Disp: 100 strip, Rfl: 12   metFORMIN (GLUCOPHAGE-XR) 500 MG 24 hr tablet, Take 1 tablet (500 mg total) by mouth 2 (two) times daily with a meal., Disp: 180 tablet, Rfl: 1   Misc. Devices (GOJJI WEIGHT SCALE) MISC, 1 Device by Does not apply route as needed., Disp: 1 each, Rfl: 0   Prenatal Vit-Fe Fumarate-FA (PREPLUS) 27-1 MG TABS, Take 1 tablet by mouth daily. (Patient not taking: Reported on 05/07/2024), Disp: 30 tablet, Rfl: 13   prenatal vitamin w/FE, FA (PRENATAL 1 + 1) 27-1 MG  TABS tablet, Take 1 tablet by mouth daily at 12 noon., Disp: 30 tablet, Rfl: 11   sertraline  (ZOLOFT ) 50 MG tablet, Take 2 tablets by mouth daily. (Patient not taking: Reported on 05/07/2024), Disp: , Rfl:   Medical History:  Past Medical History:  Diagnosis Date   Asthma    last used inhaler 2 months ago   Bronchitis    Ectopic pregnancy 05/2023   Eczema    Gestational diabetes    History of postpartum hemorrhage 07/18/2022   History of preterm delivery 07/08/2022   Hypertension    Obesity    Seasonal allergies     Labs: OGTT fasting 92, 1 hour 151, 2 hour 95 on 04/18/2024 Lab Results  Component Value Date   HGBA1C 5.7 (H) 04/12/2024   Dietary and Lifestyle History:  10/7: met 500 BID 04/25/2024 : Pt presents today alone with her meter. Pt reports she does the majority of the cooking and shopping for family of six. Pt reports she works full time on third shift from 2 am-10:30 am mostly sitting. Pt reports she avoids beef and pork for personal preferences. Pt states she can avoid her food allergy of shellfish successfully.  Pt performed 2 hour post prandial in office today with a value of 120 mg/dL. Pt reports she started walking daily for 15 minutes upon diagnosis of GDM. Pt states a willingness to avoid juice intake moving forward for optimal blood  sugar control.    Pt reports she falls asleep at night before she is able to check her blood sugar and states testing an hour after meals could prove to be a better schedule. Pt reports her current schedule is to wake up near 12:30p-2p daily with sleep intervals of 2-4 hour 2-3 times daily. All Pt's questions were answered during this encounter.   Physical Activity: walking 15 minutes daily  Stress: 5 out of 10 / self care includes: sleep Sleep: interrupted; on average hours 2-4 hours and takes naps 2-3 hours *2-3 daily    24 hr Recall:  First Meal:  ~1-2 pm: turkey, 2 slices of whole wheat, mayo, cheese, lettuce, water, apple juice  (127 mg/dL, reported as 2 hour post prandial per Pt) Snack:  2 handfuls popcorn, water Second meal:  noodles, tomato sauce, cheese, salad with dressing, apple juice (127 mg/dL, reported as 2 hour post prandial per Pt) Snack:  none napping Third meal:  fish, potatoes or noodles with cheese or green beans, corn, chicken, water  Snack:  none  Beverages: water, juice    NUTRITION INTERVENTION  Nutrition education (E-1) on the following topics:   Initial Follow-up  [x]  []  Definition of Gestational Diabetes [x]  []  Why dietary management is important in controlling blood glucose [x]  []  Effects each nutrient has on blood glucose levels [x]  []  Simple carbohydrates vs complex carbohydrates [x]  []  Fluid intake [x]  []  Creating a balanced meal plan [x]  []  Carbohydrate counting  [x]  []  When to check blood glucose levels [x]  []  Proper blood glucose monitoring techniques [x]  []  Effect of stress and stress reduction techniques  [x]  []  Exercise effect on blood glucose levels, appropriate exercise during pregnancy [x]  []  Importance of limiting caffeine  and abstaining from alcohol and smoking [x]  []  Medications used for blood sugar control during pregnancy [x]  []  Hypoglycemia and rule of 15 [x]  []  Postpartum self care   Patient has a meter prior to visit. Patient is instructed to begin testing pre breakfast and 1 hours after each meal. Patient instructed to monitor glucose levels: QID FBS: 60 - <= 95 mg/dL; 1 hour: <= 859 mg/dL  Patient received handouts: Nutrition Diabetes and Pregnancy Carbohydrate Counting List 1 hour Blood glucose log Snack ideas for diabetes during pregnancy Plate Planner  Patient will be seen for follow-up: ***

## 2024-06-13 ENCOUNTER — Other Ambulatory Visit

## 2024-06-13 DIAGNOSIS — O24419 Gestational diabetes mellitus in pregnancy, unspecified control: Secondary | ICD-10-CM

## 2024-06-20 ENCOUNTER — Ambulatory Visit: Attending: Obstetrics and Gynecology | Admitting: Obstetrics and Gynecology

## 2024-06-20 ENCOUNTER — Ambulatory Visit

## 2024-06-20 ENCOUNTER — Other Ambulatory Visit: Payer: Self-pay | Admitting: *Deleted

## 2024-06-20 VITALS — BP 127/73 | HR 97

## 2024-06-20 DIAGNOSIS — Z7982 Long term (current) use of aspirin: Secondary | ICD-10-CM | POA: Diagnosis not present

## 2024-06-20 DIAGNOSIS — Z6841 Body Mass Index (BMI) 40.0 and over, adult: Secondary | ICD-10-CM

## 2024-06-20 DIAGNOSIS — O10919 Unspecified pre-existing hypertension complicating pregnancy, unspecified trimester: Secondary | ICD-10-CM

## 2024-06-20 DIAGNOSIS — O09899 Supervision of other high risk pregnancies, unspecified trimester: Secondary | ICD-10-CM

## 2024-06-20 DIAGNOSIS — O24419 Gestational diabetes mellitus in pregnancy, unspecified control: Secondary | ICD-10-CM

## 2024-06-20 DIAGNOSIS — O10912 Unspecified pre-existing hypertension complicating pregnancy, second trimester: Secondary | ICD-10-CM | POA: Insufficient documentation

## 2024-06-20 DIAGNOSIS — O24415 Gestational diabetes mellitus in pregnancy, controlled by oral hypoglycemic drugs: Secondary | ICD-10-CM | POA: Insufficient documentation

## 2024-06-20 DIAGNOSIS — O10012 Pre-existing essential hypertension complicating pregnancy, second trimester: Secondary | ICD-10-CM | POA: Diagnosis not present

## 2024-06-20 DIAGNOSIS — O09292 Supervision of pregnancy with other poor reproductive or obstetric history, second trimester: Secondary | ICD-10-CM | POA: Insufficient documentation

## 2024-06-20 DIAGNOSIS — O99212 Obesity complicating pregnancy, second trimester: Secondary | ICD-10-CM | POA: Insufficient documentation

## 2024-06-20 DIAGNOSIS — Z362 Encounter for other antenatal screening follow-up: Secondary | ICD-10-CM | POA: Diagnosis not present

## 2024-06-20 DIAGNOSIS — Z3A24 24 weeks gestation of pregnancy: Secondary | ICD-10-CM | POA: Insufficient documentation

## 2024-06-20 DIAGNOSIS — O99210 Obesity complicating pregnancy, unspecified trimester: Secondary | ICD-10-CM

## 2024-06-20 DIAGNOSIS — Z3480 Encounter for supervision of other normal pregnancy, unspecified trimester: Secondary | ICD-10-CM

## 2024-06-20 DIAGNOSIS — Z7984 Long term (current) use of oral hypoglycemic drugs: Secondary | ICD-10-CM | POA: Insufficient documentation

## 2024-06-20 DIAGNOSIS — O09892 Supervision of other high risk pregnancies, second trimester: Secondary | ICD-10-CM | POA: Diagnosis not present

## 2024-06-20 DIAGNOSIS — O09299 Supervision of pregnancy with other poor reproductive or obstetric history, unspecified trimester: Secondary | ICD-10-CM

## 2024-06-20 NOTE — Progress Notes (Signed)
 Maternal-Fetal Medicine Consultation  Name: Autumn Duarte  MRN: [id number]  GA: [id number] [redacted]w[redacted]d   Gestational diabetes.  Patient takes metformin  XR 500 mg twice daily.  She reports her fasting levels are around 98 mg/dL and postprandial levels are within normal range. Chronic hypertension.  Well-controlled without antihypertensives.  Blood pressure today at our office is 127/73 mmHg.  Patient takes low-dose aspirin  prophylaxis. Pregravid BMI 61.  Ultrasound Fetal growth is appropriate for gestational age.  Normal amniotic fluid.  Fetal anatomical survey was completed and appears normal.  Gestational diabetes I discussed the importance of good blood glucose control to prevent adverse outcomes.  She may benefit from increasing metformin  dosage to 1000 mg at night.  If diabetes is not well-controlled on metformin , insulin should be initiated. I discussed ultrasound guidelines of serial fetal growth assessment and weekly antenatal testing beginning at [redacted] weeks gestation.  Recommendations - Weekly antenatal testing from [redacted] weeks gestation. - Fetal growth assessment in 4 weeks.     Consultation including face-to-face (more than 50%) counseling 20 minutes.

## 2024-06-28 ENCOUNTER — Encounter: Payer: Self-pay | Admitting: Obstetrics and Gynecology

## 2024-07-09 ENCOUNTER — Other Ambulatory Visit: Payer: Self-pay | Admitting: Certified Nurse Midwife

## 2024-07-09 DIAGNOSIS — J4520 Mild intermittent asthma, uncomplicated: Secondary | ICD-10-CM

## 2024-07-12 ENCOUNTER — Encounter: Payer: Self-pay | Admitting: Obstetrics and Gynecology

## 2024-07-12 ENCOUNTER — Other Ambulatory Visit: Payer: Self-pay

## 2024-07-18 ENCOUNTER — Ambulatory Visit

## 2024-07-31 ENCOUNTER — Ambulatory Visit

## 2024-08-09 ENCOUNTER — Encounter: Payer: Self-pay | Admitting: Obstetrics and Gynecology

## 2024-08-13 ENCOUNTER — Ambulatory Visit: Payer: Self-pay | Admitting: Certified Nurse Midwife

## 2024-08-13 ENCOUNTER — Other Ambulatory Visit: Payer: Self-pay

## 2024-08-13 VITALS — BP 136/85 | HR 120 | Wt >= 6400 oz

## 2024-08-13 DIAGNOSIS — O09893 Supervision of other high risk pregnancies, third trimester: Secondary | ICD-10-CM | POA: Diagnosis not present

## 2024-08-13 DIAGNOSIS — O09899 Supervision of other high risk pregnancies, unspecified trimester: Secondary | ICD-10-CM

## 2024-08-13 DIAGNOSIS — Z3A32 32 weeks gestation of pregnancy: Secondary | ICD-10-CM | POA: Diagnosis not present

## 2024-08-13 DIAGNOSIS — O24419 Gestational diabetes mellitus in pregnancy, unspecified control: Secondary | ICD-10-CM | POA: Diagnosis not present

## 2024-08-13 DIAGNOSIS — Z3480 Encounter for supervision of other normal pregnancy, unspecified trimester: Secondary | ICD-10-CM

## 2024-08-13 DIAGNOSIS — O24410 Gestational diabetes mellitus in pregnancy, diet controlled: Secondary | ICD-10-CM

## 2024-08-13 MED ORDER — METFORMIN HCL 500 MG PO TABS: 1000.0000 mg | ORAL_TABLET | Freq: Two times a day (BID) | ORAL | 5 refills | Status: AC

## 2024-08-13 NOTE — Progress Notes (Signed)
 "  PRENATAL VISIT NOTE  Subjective:  Autumn Duarte is a 29 y.o. H4E8875 at [redacted]w[redacted]d being seen today for ongoing prenatal care.  She is currently monitored for the following issues for this high-risk pregnancy and has PROBLEMS WITH HEARING; BMI 50.0-59.9, adult (HCC); Chronic hypertension during pregnancy, antepartum; Moderate major depression (HCC); Prediabetes; Adjustment disorder with mixed anxiety and depressed mood; Mild intermittent asthma without complication; History of preterm delivery, currently pregnant; Ruptured left tubal ectopic pregnancy; Supervision of other normal pregnancy, antepartum; Prediabetes in mother during pregnancy; Gestational diabetes mellitus (GDM) affecting pregnancy; and Obesity affecting pregnancy, antepartum on their problem list.  Patient reports no complaints.   . Vag. Bleeding: None.  Movement: Present. Denies leaking of fluid.   The following portions of the patient's history were reviewed and updated as appropriate: allergies, current medications, past family history, past medical history, past social history, past surgical history and problem list.   Objective:   Vitals:   08/13/24 1625  BP: 136/85  Pulse: (!) 120  Weight: (!) 405 lb 9.6 oz (184 kg)    Fetal Status:      Movement: Present    General: Alert, oriented and cooperative. Patient is in no acute distress.  Skin: Skin is warm and dry. No rash noted.   Cardiovascular: Normal heart rate noted  Respiratory: Normal respiratory effort, no problems with respiration noted  Abdomen: Soft, gravid, appropriate for gestational age.  Pain/Pressure: Absent     Pelvic: Cervical exam deferred        Extremities: Normal range of motion.  Edema: None  Mental Status: Normal mood and affect. Normal behavior. Normal judgment and thought content.      04/12/2024   10:51 AM 03/20/2024    3:57 PM 06/19/2023   11:48 AM  Depression screen PHQ 2/9  Decreased Interest 0 0 1  Down, Depressed, Hopeless 0 0 0  PHQ -  2 Score 0 0 1  Altered sleeping 0 1 1  Tired, decreased energy 0 0 1  Change in appetite 0 0 0  Feeling bad or failure about yourself  0 0 1  Trouble concentrating 0 0 2  Moving slowly or fidgety/restless 0 0 0  Suicidal thoughts 0 0 0  PHQ-9 Score 0  1  6      Data saved with a previous flowsheet row definition        04/12/2024   10:51 AM 03/20/2024    3:57 PM 06/19/2023   11:49 AM 05/26/2022    5:39 PM  GAD 7 : Generalized Anxiety Score  Nervous, Anxious, on Edge 0 1 1 0  Control/stop worrying 0 0 1 0  Worry too much - different things 0 1 1 0  Trouble relaxing 0 0 1 0  Restless 0 1 0 1  Easily annoyed or irritable 0 1 2 1   Afraid - awful might happen 0 0 1 0  Total GAD 7 Score 0 4 7 2     Assessment and Plan:  Pregnancy: H4E8875 at [redacted]w[redacted]d 1. Supervision of other normal pregnancy, antepartum (Primary) - Doing well, feeling regular and vigorous fetal movement  2. [redacted] weeks gestation of pregnancy - Routine PNC, anticipatory guidance.   3. History of preterm delivery, currently pregnant - No signs of labor. Previous preterm was twins.   4. Gestational diabetes mellitus (GDM) affecting pregnancy - Did not bring log. Reports most values are out of range, has been eating a lot of bread.  - Taking 500mg  Metformin   BID. Increase to 1000mg  BID, prescription sent - Ambulatory referral to diabetes nutrition counseling.  - Discussed order of consumption and composition of meals to aid in control of sugar values.   Preterm labor symptoms and general obstetric precautions including but not limited to vaginal bleeding, contractions, leaking of fluid and fetal movement were reviewed in detail with the patient. Please refer to After Visit Summary for other counseling recommendations.   Return in about 2 weeks (around 08/27/2024) for HROB.  Future Appointments  Date Time Provider Department Center  08/15/2024 10:15 AM [facility] PROVIDER 1 [facility]  08/15/2024 10:30 AM [facility] US3  [facility]  08/22/2024 10:15 AM [facility] PROVIDER 1 [facility]  08/22/2024 10:30 AM [facility] US5 [facility]  08/28/2024  1:15 PM Ilean Norleen GAILS, MD [facility]  08/29/2024 10:15 AM [facility] PROVIDER 1 [facility]  08/29/2024 10:30 AM [facility] US5 [facility]    Camie DELENA Rote, CNM  "

## 2024-08-14 NOTE — Progress Notes (Unsigned)
 No questionnaires on file.   Class start Time: ***   Class End Time: ***  This was a class of *** patients.   Patient was seen on *** for Gestational Diabetes self-management class at the [facility]. The following learning objectives were met by the patient during this course:  States the definition of Gestational Diabetes States why dietary management is important in controlling blood glucose Describes the effects each nutrient has on blood glucose levels Demonstrates ability to create a balanced meal plan Demonstrates carbohydrate counting  States when to check blood glucose levels Demonstrates proper blood glucose monitoring techniques States the effect of stress and exercise on blood glucose levels States the importance of limiting caffeine  and abstaining from alcohol and smoking  Blood glucose monitor given: *** Lot # *** Exp: *** Blood glucose reading: ***  *** Patient has a meter prior to visit. Patient is *** testing pre breakfast and 2 hours after each meal. FBS: *** Postprandial: *** Blood glucose today in class ***  Patient instructed to monitor glucose levels:  QID FBS: 60 - <95 1 hour: <140 2 hour: <120     *Patient received handouts: Nutrition Diabetes and Pregnancy Carbohydrate Counting List Blood glucose log Snack ideas for diabetes during pregnancy Plate Planner  Patient will be seen for follow-up as needed.

## 2024-08-15 ENCOUNTER — Ambulatory Visit: Admitting: Obstetrics

## 2024-08-15 ENCOUNTER — Ambulatory Visit: Attending: Obstetrics and Gynecology

## 2024-08-15 ENCOUNTER — Encounter: Payer: Self-pay | Admitting: Family Medicine

## 2024-08-15 VITALS — BP 124/72 | HR 106

## 2024-08-15 DIAGNOSIS — O99213 Obesity complicating pregnancy, third trimester: Secondary | ICD-10-CM | POA: Insufficient documentation

## 2024-08-15 DIAGNOSIS — O10919 Unspecified pre-existing hypertension complicating pregnancy, unspecified trimester: Secondary | ICD-10-CM | POA: Diagnosis present

## 2024-08-15 DIAGNOSIS — E669 Obesity, unspecified: Secondary | ICD-10-CM | POA: Diagnosis not present

## 2024-08-15 DIAGNOSIS — O99210 Obesity complicating pregnancy, unspecified trimester: Secondary | ICD-10-CM | POA: Diagnosis present

## 2024-08-15 DIAGNOSIS — O24415 Gestational diabetes mellitus in pregnancy, controlled by oral hypoglycemic drugs: Secondary | ICD-10-CM | POA: Insufficient documentation

## 2024-08-15 DIAGNOSIS — O10013 Pre-existing essential hypertension complicating pregnancy, third trimester: Secondary | ICD-10-CM

## 2024-08-15 DIAGNOSIS — O24419 Gestational diabetes mellitus in pregnancy, unspecified control: Secondary | ICD-10-CM

## 2024-08-15 DIAGNOSIS — Z3A32 32 weeks gestation of pregnancy: Secondary | ICD-10-CM | POA: Insufficient documentation

## 2024-08-15 DIAGNOSIS — O09213 Supervision of pregnancy with history of pre-term labor, third trimester: Secondary | ICD-10-CM

## 2024-08-15 DIAGNOSIS — O10913 Unspecified pre-existing hypertension complicating pregnancy, third trimester: Secondary | ICD-10-CM | POA: Diagnosis present

## 2024-08-15 NOTE — Progress Notes (Signed)
 MFM Consult Note  Autumn Duarte is currently at [redacted]w[redacted]d. She has been followed due to maternal obesity with a BMI of 61.8, chronic hypertension that is not treated with any medications, and gestational diabetes treated with metformin .    The patient reports that her fingerstick values have been elevated.  Her metformin  dose was just increased a few days ago.  She has a another appointment early next week to determine if she needs to be placed on insulin.  She reports feeling fetal movements throughout the day.  Her blood pressure today was 124/72.  Sonographic findings Single intrauterine pregnancy at 32w 2d. Fetal cardiac activity: Observed. Presentation: Cephalic. Fetal biometry shows the estimated fetal weight of 5 lb 14 oz,  2673g (> 99%). Amniotic fluid: Within normal limits. AFI: 29.3 cm.  MVP: 9.61 cm. Placenta: Posterior Fundal. BPP: 8/8.   The patient was advised to continue to monitor her fingerstick values on a daily basis.    Should her fingerstick values remain elevated, she should be started on insulin for treatment.    We will continue to follow her with weekly fetal testing until delivery.    Due to her underlying medical conditions and the large for gestational age fetus noted today, delivery should be considered at around 37 weeks.    She will return in 1 week for another BPP.  The patient stated that all of her questions were answered.   A total of 20 minutes was spent counseling and coordinating the care for this patient.  Greater than 50% of the time was spent in direct face-to-face contact.

## 2024-08-16 ENCOUNTER — Encounter: Payer: Self-pay | Admitting: Certified Nurse Midwife

## 2024-08-21 ENCOUNTER — Encounter

## 2024-08-21 DIAGNOSIS — O24410 Gestational diabetes mellitus in pregnancy, diet controlled: Secondary | ICD-10-CM

## 2024-08-22 ENCOUNTER — Ambulatory Visit: Attending: Obstetrics and Gynecology

## 2024-08-22 ENCOUNTER — Ambulatory Visit (HOSPITAL_BASED_OUTPATIENT_CLINIC_OR_DEPARTMENT_OTHER): Admitting: Obstetrics

## 2024-08-22 VITALS — BP 140/80 | HR 105

## 2024-08-22 DIAGNOSIS — Z3A33 33 weeks gestation of pregnancy: Secondary | ICD-10-CM | POA: Insufficient documentation

## 2024-08-22 DIAGNOSIS — O10913 Unspecified pre-existing hypertension complicating pregnancy, third trimester: Secondary | ICD-10-CM | POA: Insufficient documentation

## 2024-08-22 DIAGNOSIS — O10013 Pre-existing essential hypertension complicating pregnancy, third trimester: Secondary | ICD-10-CM | POA: Diagnosis not present

## 2024-08-22 DIAGNOSIS — O99213 Obesity complicating pregnancy, third trimester: Secondary | ICD-10-CM | POA: Insufficient documentation

## 2024-08-22 DIAGNOSIS — O24415 Gestational diabetes mellitus in pregnancy, controlled by oral hypoglycemic drugs: Secondary | ICD-10-CM | POA: Diagnosis not present

## 2024-08-22 DIAGNOSIS — E669 Obesity, unspecified: Secondary | ICD-10-CM | POA: Diagnosis not present

## 2024-08-22 DIAGNOSIS — O24419 Gestational diabetes mellitus in pregnancy, unspecified control: Secondary | ICD-10-CM | POA: Insufficient documentation

## 2024-08-22 DIAGNOSIS — O24410 Gestational diabetes mellitus in pregnancy, diet controlled: Secondary | ICD-10-CM

## 2024-08-22 DIAGNOSIS — O10919 Unspecified pre-existing hypertension complicating pregnancy, unspecified trimester: Secondary | ICD-10-CM | POA: Diagnosis present

## 2024-08-22 DIAGNOSIS — Z3480 Encounter for supervision of other normal pregnancy, unspecified trimester: Secondary | ICD-10-CM

## 2024-08-22 NOTE — Progress Notes (Signed)
 MFM Consult Note  Autumn Duarte is currently at [redacted]w[redacted]d. She has been followed due to maternal obesity with a BMI of 61.8, chronic hypertension that is not treated with any medications, and gestational diabetes treated with metformin .  A large for gestational age fetus with the EFW measuring greater than the 99th percentile was noted during her growth scan last week.  The patient reports that her fasting fingerstick values remain elevated in the 120s to 130s range.  Her 2-hour postprandial fingerstick values are mostly within normal limits.  She denies any problems since her last exam and reports feeling fetal movements throughout the day.    Her blood pressure today was 140/80.  Sonographic findings Single intrauterine pregnancy at 33w 2d. Fetal cardiac activity: Observed. Presentation: Cephalic. Amniotic fluid: Subjectively upper-normal. AFI: 24.47 cm,  MVP: 8.4 cm. Placenta: Posterior Fundal. BPP: 8/8.   The patient was advised that should her fasting fingerstick values remain elevated, a long-acting insulin  such as Lantus  or NPH insulin  should be added at bedtime to help normalize her fasting fingerstick values.  Due to her underlying medical conditions, delivery should be considered at around 37 weeks.    We will continue to follow her with weekly fetal testing until delivery.    She will return in 1 week for a BPP.  The patient stated that all of her questions were answered.   A total of 20 minutes was spent counseling and coordinating the care for this patient.  Greater than 50% of the time was spent in direct face-to-face contact.

## 2024-08-22 NOTE — Progress Notes (Signed)
 Patient reports that she checks blood glucose at home and readings are as follows: Fasting levels range from 130 to 132, and two hour post-meal reading ranges from 120.to 121.

## 2024-08-28 ENCOUNTER — Ambulatory Visit (INDEPENDENT_AMBULATORY_CARE_PROVIDER_SITE_OTHER): Payer: Self-pay | Admitting: Family Medicine

## 2024-08-28 ENCOUNTER — Other Ambulatory Visit: Payer: Self-pay

## 2024-08-28 ENCOUNTER — Other Ambulatory Visit (HOSPITAL_COMMUNITY)
Admission: RE | Admit: 2024-08-28 | Discharge: 2024-08-28 | Disposition: A | Discharge status: Home / Self Care | Source: Ambulatory Visit | Attending: Family Medicine | Admitting: Family Medicine

## 2024-08-28 VITALS — BP 127/85 | HR 130 | Wt >= 6400 oz

## 2024-08-28 DIAGNOSIS — O09893 Supervision of other high risk pregnancies, third trimester: Secondary | ICD-10-CM | POA: Diagnosis not present

## 2024-08-28 DIAGNOSIS — Z3A34 34 weeks gestation of pregnancy: Secondary | ICD-10-CM | POA: Insufficient documentation

## 2024-08-28 DIAGNOSIS — O24419 Gestational diabetes mellitus in pregnancy, unspecified control: Secondary | ICD-10-CM

## 2024-08-28 DIAGNOSIS — O24410 Gestational diabetes mellitus in pregnancy, diet controlled: Secondary | ICD-10-CM | POA: Diagnosis not present

## 2024-08-28 DIAGNOSIS — J4520 Mild intermittent asthma, uncomplicated: Secondary | ICD-10-CM

## 2024-08-28 DIAGNOSIS — Z3483 Encounter for supervision of other normal pregnancy, third trimester: Secondary | ICD-10-CM | POA: Insufficient documentation

## 2024-08-28 DIAGNOSIS — O09899 Supervision of other high risk pregnancies, unspecified trimester: Secondary | ICD-10-CM

## 2024-08-28 DIAGNOSIS — Z3480 Encounter for supervision of other normal pregnancy, unspecified trimester: Secondary | ICD-10-CM

## 2024-08-28 MED ORDER — INSULIN GLARGINE 100 UNIT/ML ~~LOC~~ SOLN: 8.0000 [IU] | Freq: Every day | SUBCUTANEOUS | 11 refills | Status: AC

## 2024-08-28 NOTE — Progress Notes (Signed)
 "  PRENATAL VISIT NOTE  Subjective:  Autumn Duarte is a 29 y.o. [id number] at [redacted]w[redacted]d being seen today for ongoing prenatal care.  She is currently monitored for the following issues for this high-risk pregnancy and has PROBLEMS WITH HEARING; BMI 50.0-59.9, adult (HCC); Chronic hypertension during pregnancy, antepartum; Moderate major depression (HCC); Prediabetes; Adjustment disorder with mixed anxiety and depressed mood; Mild intermittent asthma without complication; History of preterm delivery, currently pregnant; Ruptured left tubal ectopic pregnancy; Supervision of other normal pregnancy, antepartum; Prediabetes in mother during pregnancy; Gestational diabetes mellitus (GDM) affecting pregnancy; and Obesity affecting pregnancy, antepartum on their problem list.  Patient reports no bleeding, no contractions, no cramping, and no leaking.  Contractions: Not present. Vag. Bleeding: None.  Movement: Present. Denies leaking of fluid.   The following portions of the patient's history were reviewed and updated as appropriate: allergies, current medications, past family history, past medical history, past social history, past surgical history and problem list.   Objective:   Vitals:   08/28/24 1330  BP: 127/85  Pulse: (!) 130  Weight: (!) 403 lb 6.4 oz (183 kg)    Fetal Status:  Fetal Heart Rate (bpm): 154   Movement: Present    General: Alert, oriented and cooperative. Patient is in no acute distress.  Skin: Skin is warm and dry. No rash noted.   Cardiovascular: Normal heart rate noted  Respiratory: Normal respiratory effort, no problems with respiration noted  Abdomen: Soft, gravid, appropriate for gestational age.  Pain/Pressure: Absent     Pelvic: Cervical exam deferred        Extremities: Normal range of motion.  Edema: None  Mental Status: Normal mood and affect. Normal behavior. Normal judgment and thought content.      04/12/2024   10:51 AM 03/20/2024    3:57 PM 06/19/2023   11:48 AM   Depression screen PHQ 2/9  Decreased Interest 0 0 1  Down, Depressed, Hopeless 0 0 0  PHQ - 2 Score 0 0 1  Altered sleeping 0 1 1  Tired, decreased energy 0 0 1  Change in appetite 0 0 0  Feeling bad or failure about yourself  0 0 1  Trouble concentrating 0 0 2  Moving slowly or fidgety/restless 0 0 0  Suicidal thoughts 0 0 0  PHQ-9 Score 0  1  6      Data saved with a previous flowsheet row definition        04/12/2024   10:51 AM 03/20/2024    3:57 PM 06/19/2023   11:49 AM 05/26/2022    5:39 PM  GAD 7 : Generalized Anxiety Score  Nervous, Anxious, on Edge 0  1  1  0   Control/stop worrying 0  0  1  0   Worry too much - different things 0  1  1  0   Trouble relaxing 0  0  1  0   Restless 0  1  0  1   Easily annoyed or irritable 0  1  2  1    Afraid - awful might happen 0  0  1  0   Total GAD 7 Score 0 4 7 2      Data saved with a previous flowsheet row definition    Assessment and Plan:  Pregnancy: [id number] at [redacted]w[redacted]d 1. Supervision of other normal pregnancy, antepartum FHR and BP appropriate today Will get GBS and GC/chlamydia swabs - RPR W/RFLX TO RPR TITER, TREPONEMAL AB, SCREEN AND DIAGNOSIS -  HIV Antibody (routine testing w rflx) - CBC  2. History of preterm delivery, currently pregnant Delivery with twins at 34 weeks No signs of labor today  3. Diet controlled gestational diabetes mellitus (GDM) in third trimester No longer diet controlled, currently on metformin  and starting insulin  today see below  4. Gestational diabetes mellitus (GDM) affecting pregnancy Poorly controlled gestational diabetes with almost all the blood glucoses above normal limits.  Currently on metformin .  Needs better control so we will start insulin  8 units nightly and will follow-up with patient in 1 week.  5. Mild intermittent asthma without complication Stable  6. [redacted] weeks gestation of pregnancy (Primary) - GC/Chlamydia probe amp ([place])not at [facility] - Culture, beta strep  (group b only)  Preterm labor symptoms and general obstetric precautions including but not limited to vaginal bleeding, contractions, leaking of fluid and fetal movement were reviewed in detail with the patient. Please refer to After Visit Summary for other counseling recommendations.   No follow-ups on file.  Future Appointments  Date Time Provider Department Center  08/29/2024 10:15 AM [facility] PROVIDER 1 [facility]  08/29/2024 10:30 AM [facility] US5 [facility]  09/12/2024  3:15 PM [facility] PROVIDER 1 [facility]  09/12/2024  3:30 PM [facility] US1 [facility]  09/19/2024  3:15 PM [facility] PROVIDER 1 [facility]  09/19/2024  3:30 PM [facility] US2 [facility]    Norleen LULLA Rover, MD  "

## 2024-08-29 ENCOUNTER — Other Ambulatory Visit: Payer: Self-pay

## 2024-08-29 ENCOUNTER — Ambulatory Visit (HOSPITAL_BASED_OUTPATIENT_CLINIC_OR_DEPARTMENT_OTHER): Admitting: Obstetrics and Gynecology

## 2024-08-29 ENCOUNTER — Encounter: Payer: Self-pay | Admitting: Family Medicine

## 2024-08-29 ENCOUNTER — Ambulatory Visit: Attending: Obstetrics and Gynecology

## 2024-08-29 VITALS — BP 129/68 | HR 118

## 2024-08-29 DIAGNOSIS — O24419 Gestational diabetes mellitus in pregnancy, unspecified control: Secondary | ICD-10-CM

## 2024-08-29 DIAGNOSIS — O99210 Obesity complicating pregnancy, unspecified trimester: Secondary | ICD-10-CM

## 2024-08-29 DIAGNOSIS — O10919 Unspecified pre-existing hypertension complicating pregnancy, unspecified trimester: Secondary | ICD-10-CM | POA: Diagnosis present

## 2024-08-29 DIAGNOSIS — O09213 Supervision of pregnancy with history of pre-term labor, third trimester: Secondary | ICD-10-CM

## 2024-08-29 DIAGNOSIS — Z3A34 34 weeks gestation of pregnancy: Secondary | ICD-10-CM

## 2024-08-29 DIAGNOSIS — E669 Obesity, unspecified: Secondary | ICD-10-CM

## 2024-08-29 DIAGNOSIS — O10013 Pre-existing essential hypertension complicating pregnancy, third trimester: Secondary | ICD-10-CM | POA: Diagnosis not present

## 2024-08-29 DIAGNOSIS — O24414 Gestational diabetes mellitus in pregnancy, insulin controlled: Secondary | ICD-10-CM | POA: Diagnosis not present

## 2024-08-29 LAB — GC/CHLAMYDIA PROBE AMP (~~LOC~~) NOT AT ARMC
Chlamydia: NEGATIVE
Comment: NEGATIVE
Comment: NORMAL
Neisseria Gonorrhea: NEGATIVE

## 2024-08-29 LAB — CBC
Hematocrit: 36.6 % (ref 34.0–46.6)
Hemoglobin: 12 g/dL (ref 11.1–15.9)
MCH: 30.1 pg (ref 26.6–33.0)
MCHC: 32.8 g/dL (ref 31.5–35.7)
MCV: 92 fL (ref 79–97)
Platelets: 282 10*3/uL (ref 150–450)
RBC: 3.99 x10E6/uL (ref 3.77–5.28)
RDW: 13.4 % (ref 11.7–15.4)
WBC: 11.1 10*3/uL — ABNORMAL HIGH (ref 3.4–10.8)

## 2024-08-29 LAB — HIV ANTIBODY (ROUTINE TESTING W REFLEX): HIV Screen 4th Generation wRfx: NONREACTIVE

## 2024-08-29 LAB — SYPHILIS: RPR W/REFLEX TO RPR TITER AND TREPONEMAL ANTIBODIES, TRADITIONAL SCREENING AND DIAGNOSIS ALGORITHM: RPR Ser Ql: NONREACTIVE

## 2024-08-29 MED ORDER — PEN NEEDLES 32G X 5 MM MISC: 1.0000 | 1 refills | Status: AC | PRN

## 2024-08-29 MED ORDER — INSULIN GLARGINE 100 UNIT/ML SOLOSTAR PEN: 8.0000 [IU] | PEN_INJECTOR | Freq: Every day | SUBCUTANEOUS | 11 refills | Status: AC

## 2024-08-29 MED ORDER — "INSULIN SYRINGE 31G X 5/16"" 1 ML MISC": 1.0000 | 0 refills | Status: AC | PRN

## 2024-08-29 NOTE — Progress Notes (Signed)
 After review, MFM consult with provider is not indicated for today  Arna Ranks, MD 08/29/2024 11:00 AM  [facility]

## 2024-09-02 LAB — CULTURE, BETA STREP (GROUP B ONLY): Strep Gp B Culture: NEGATIVE

## 2024-09-06 ENCOUNTER — Other Ambulatory Visit: Payer: Self-pay

## 2024-09-06 ENCOUNTER — Ambulatory Visit: Payer: Self-pay | Admitting: Family Medicine

## 2024-09-06 VITALS — BP 133/82 | HR 108 | Wt >= 6400 oz

## 2024-09-06 DIAGNOSIS — Z3480 Encounter for supervision of other normal pregnancy, unspecified trimester: Secondary | ICD-10-CM

## 2024-09-06 DIAGNOSIS — Z3A35 35 weeks gestation of pregnancy: Secondary | ICD-10-CM

## 2024-09-06 DIAGNOSIS — J4520 Mild intermittent asthma, uncomplicated: Secondary | ICD-10-CM

## 2024-09-06 DIAGNOSIS — O09899 Supervision of other high risk pregnancies, unspecified trimester: Secondary | ICD-10-CM

## 2024-09-06 DIAGNOSIS — O24410 Gestational diabetes mellitus in pregnancy, diet controlled: Secondary | ICD-10-CM

## 2024-09-06 DIAGNOSIS — O24419 Gestational diabetes mellitus in pregnancy, unspecified control: Secondary | ICD-10-CM

## 2024-09-06 NOTE — Progress Notes (Signed)
 "  PRENATAL VISIT NOTE  Subjective:  Autumn Duarte is a 29 y.o. [id number] at [redacted]w[redacted]d being seen today for ongoing prenatal care.  She is currently monitored for the following issues for this high-risk pregnancy and has PROBLEMS WITH HEARING; BMI 50.0-59.9, adult (HCC); Chronic hypertension during pregnancy, antepartum; Moderate major depression (HCC); Prediabetes; Adjustment disorder with mixed anxiety and depressed mood; Mild intermittent asthma without complication; History of preterm delivery, currently pregnant; Ruptured left tubal ectopic pregnancy; Supervision of other normal pregnancy, antepartum; Prediabetes in mother during pregnancy; Gestational diabetes mellitus (GDM) affecting pregnancy; and Obesity affecting pregnancy, antepartum on their problem list.  Patient reports no bleeding, no contractions, no cramping, and no leaking.  Contractions: Not present. Vag. Bleeding: None.  Movement: Present. Denies leaking of fluid.   The following portions of the patient's history were reviewed and updated as appropriate: allergies, current medications, past family history, past medical history, past social history, past surgical history and problem list.   Objective:   Vitals:   09/06/24 0950  BP: 133/82  Pulse: (!) 108  Weight: (!) 404 lb (183.3 kg)    Fetal Status:  Fetal Heart Rate (bpm): 134   Movement: Present    General: Alert, oriented and cooperative. Patient is in no acute distress.  Skin: Skin is warm and dry. No rash noted.   Cardiovascular: Normal heart rate noted  Respiratory: Normal respiratory effort, no problems with respiration noted  Abdomen: Soft, gravid, appropriate for gestational age.  Pain/Pressure: Absent     Pelvic: Cervical exam deferred        Extremities: Normal range of motion.  Edema: None  Mental Status: Normal mood and affect. Normal behavior. Normal judgment and thought content.      04/12/2024   10:51 AM 03/20/2024    3:57 PM 06/19/2023   11:48 AM   Depression screen PHQ 2/9  Decreased Interest 0 0 1  Down, Depressed, Hopeless 0 0 0  PHQ - 2 Score 0 0 1  Altered sleeping 0 1 1  Tired, decreased energy 0 0 1  Change in appetite 0 0 0  Feeling bad or failure about yourself  0 0 1  Trouble concentrating 0 0 2  Moving slowly or fidgety/restless 0 0 0  Suicidal thoughts 0 0 0  PHQ-9 Score 0  1  6      Data saved with a previous flowsheet row definition        04/12/2024   10:51 AM 03/20/2024    3:57 PM 06/19/2023   11:49 AM 05/26/2022    5:39 PM  GAD 7 : Generalized Anxiety Score  Nervous, Anxious, on Edge 0  1  1  0   Control/stop worrying 0  0  1  0   Worry too much - different things 0  1  1  0   Trouble relaxing 0  0  1  0   Restless 0  1  0  1   Easily annoyed or irritable 0  1  2  1    Afraid - awful might happen 0  0  1  0   Total GAD 7 Score 0 4 7 2      Data saved with a previous flowsheet row definition   NST-moderate variability, 135 bpm, accelerations present, no decelerations, no contractions Assessment and Plan:  Pregnancy: [id number] at [redacted]w[redacted]d 1. Supervision of other normal pregnancy, antepartum (Primary) FHR and BP appropriate today NST completed per MFM recommendations and NST was category  1 - Fetal nonstress test; Future  2. History of preterm delivery, currently pregnant No signs of labor at this time  4. Gestational diabetes mellitus (GDM) affecting pregnancy Was started on insulin  in previous visit.  Has titrated to 12 units and blood sugars are improving.  Recommended increasing to 14 units and continue to monitor.  Will follow-up with patient next week.  She is scheduled for an ultrasound on 2/12 and scheduled for induction on 2/17 for poorly controlled diabetes.  5. Mild intermittent asthma without complication Currently doing well  6. [redacted] weeks gestation of pregnancy   Preterm labor symptoms and general obstetric precautions including but not limited to vaginal bleeding, contractions, leaking of  fluid and fetal movement were reviewed in detail with the patient. Please refer to After Visit Summary for other counseling recommendations.   No follow-ups on file.  Future Appointments  Date Time Provider Department Center  09/11/2024  2:35 PM Ilean Norleen GAILS, MD [facility]  09/12/2024  3:15 PM [facility] PROVIDER 1 [facility]  09/12/2024  3:30 PM [facility] US1 [facility]  09/17/2024  6:30 AM [facility] SCHED ROOM [facility] None  09/19/2024  3:15 PM [facility] PROVIDER 1 [facility]  09/19/2024  3:30 PM [facility] US2 [facility]    Norleen GAILS Ilean, MD  "

## 2024-09-11 ENCOUNTER — Encounter: Admitting: Family Medicine

## 2024-09-12 ENCOUNTER — Other Ambulatory Visit

## 2024-09-12 ENCOUNTER — Ambulatory Visit

## 2024-09-17 ENCOUNTER — Inpatient Hospital Stay (HOSPITAL_COMMUNITY): Admission: AD | Admit: 2024-09-17 | Source: Home / Self Care

## 2024-09-17 ENCOUNTER — Inpatient Hospital Stay (HOSPITAL_COMMUNITY)

## 2024-09-19 ENCOUNTER — Other Ambulatory Visit
